# Patient Record
Sex: Female | Born: 1965 | Race: Black or African American | Hispanic: No | Marital: Married | Smoking: Never smoker
Health system: Southern US, Community
[De-identification: ages and names within clinical notes are randomized; demographics above are authoritative.]

## PROBLEM LIST (undated history)

## (undated) DIAGNOSIS — E785 Hyperlipidemia, unspecified: Secondary | ICD-10-CM

## (undated) DIAGNOSIS — K219 Gastro-esophageal reflux disease without esophagitis: Secondary | ICD-10-CM

## (undated) DIAGNOSIS — N870 Mild cervical dysplasia: Secondary | ICD-10-CM

## (undated) DIAGNOSIS — I10 Essential (primary) hypertension: Secondary | ICD-10-CM

## (undated) DIAGNOSIS — N750 Cyst of Bartholin's gland: Secondary | ICD-10-CM

## (undated) DIAGNOSIS — F419 Anxiety disorder, unspecified: Secondary | ICD-10-CM

## (undated) HISTORY — DX: Mild cervical dysplasia: N87.0

## (undated) HISTORY — PX: SALPINGECTOMY: SHX328

## (undated) HISTORY — DX: Essential (primary) hypertension: I10

## (undated) HISTORY — PX: TUBAL LIGATION: SHX77

## (undated) HISTORY — DX: Hyperlipidemia, unspecified: E78.5

## (undated) HISTORY — DX: Anxiety disorder, unspecified: F41.9

## (undated) HISTORY — PX: OTHER SURGICAL HISTORY: SHX169

---

## 1987-10-30 DIAGNOSIS — N870 Mild cervical dysplasia: Secondary | ICD-10-CM

## 1987-10-30 HISTORY — DX: Mild cervical dysplasia: N87.0

## 1998-12-05 ENCOUNTER — Emergency Department (HOSPITAL_COMMUNITY): Admission: EM | Admit: 1998-12-05 | Discharge: 1998-12-05 | Payer: Self-pay | Admitting: Emergency Medicine

## 1999-03-29 ENCOUNTER — Emergency Department (HOSPITAL_COMMUNITY): Admission: EM | Admit: 1999-03-29 | Discharge: 1999-03-29 | Payer: Self-pay | Admitting: Emergency Medicine

## 1999-03-29 ENCOUNTER — Encounter: Payer: Self-pay | Admitting: Emergency Medicine

## 1999-05-18 ENCOUNTER — Emergency Department (HOSPITAL_COMMUNITY): Admission: EM | Admit: 1999-05-18 | Discharge: 1999-05-18 | Payer: Self-pay | Admitting: Emergency Medicine

## 1999-12-10 ENCOUNTER — Emergency Department (HOSPITAL_COMMUNITY): Admission: EM | Admit: 1999-12-10 | Discharge: 1999-12-10 | Payer: Self-pay | Admitting: Emergency Medicine

## 1999-12-10 ENCOUNTER — Encounter: Payer: Self-pay | Admitting: Emergency Medicine

## 1999-12-13 ENCOUNTER — Emergency Department (HOSPITAL_COMMUNITY): Admission: EM | Admit: 1999-12-13 | Discharge: 1999-12-13 | Payer: Self-pay | Admitting: Emergency Medicine

## 2000-06-05 ENCOUNTER — Other Ambulatory Visit: Admission: RE | Admit: 2000-06-05 | Discharge: 2000-06-05 | Payer: Self-pay | Admitting: Family Medicine

## 2000-06-21 ENCOUNTER — Ambulatory Visit (HOSPITAL_COMMUNITY): Admission: RE | Admit: 2000-06-21 | Discharge: 2000-06-21 | Payer: Self-pay | Admitting: Critical Care Medicine

## 2000-06-21 ENCOUNTER — Encounter: Payer: Self-pay | Admitting: Critical Care Medicine

## 2000-08-05 ENCOUNTER — Ambulatory Visit (HOSPITAL_COMMUNITY): Admission: RE | Admit: 2000-08-05 | Discharge: 2000-08-05 | Payer: Self-pay | Admitting: Critical Care Medicine

## 2002-05-08 ENCOUNTER — Other Ambulatory Visit: Admission: RE | Admit: 2002-05-08 | Discharge: 2002-05-08 | Payer: Self-pay | Admitting: Family Medicine

## 2004-01-30 ENCOUNTER — Emergency Department (HOSPITAL_COMMUNITY): Admission: EM | Admit: 2004-01-30 | Discharge: 2004-01-30 | Payer: Self-pay | Admitting: Emergency Medicine

## 2006-06-03 ENCOUNTER — Inpatient Hospital Stay (HOSPITAL_COMMUNITY): Admission: AD | Admit: 2006-06-03 | Discharge: 2006-06-03 | Payer: Self-pay | Admitting: Gynecology

## 2006-10-08 ENCOUNTER — Emergency Department (HOSPITAL_COMMUNITY): Admission: EM | Admit: 2006-10-08 | Discharge: 2006-10-08 | Payer: Self-pay | Admitting: Emergency Medicine

## 2006-12-06 ENCOUNTER — Other Ambulatory Visit: Admission: RE | Admit: 2006-12-06 | Discharge: 2006-12-06 | Payer: Self-pay | Admitting: Internal Medicine

## 2007-01-03 ENCOUNTER — Other Ambulatory Visit: Admission: RE | Admit: 2007-01-03 | Discharge: 2007-01-03 | Payer: Self-pay | Admitting: Internal Medicine

## 2007-06-11 ENCOUNTER — Encounter: Admission: RE | Admit: 2007-06-11 | Discharge: 2007-06-11 | Payer: Self-pay | Admitting: Internal Medicine

## 2007-06-20 ENCOUNTER — Other Ambulatory Visit: Admission: RE | Admit: 2007-06-20 | Discharge: 2007-06-20 | Payer: Self-pay | Admitting: Gynecology

## 2007-07-07 HISTORY — PX: GYNECOLOGIC CRYOSURGERY: SHX857

## 2008-07-15 ENCOUNTER — Other Ambulatory Visit: Admission: RE | Admit: 2008-07-15 | Discharge: 2008-07-15 | Payer: Self-pay | Admitting: Gynecology

## 2008-07-15 ENCOUNTER — Ambulatory Visit: Payer: Self-pay | Admitting: Gynecology

## 2008-07-15 ENCOUNTER — Encounter: Payer: Self-pay | Admitting: Gynecology

## 2008-07-22 ENCOUNTER — Encounter: Admission: RE | Admit: 2008-07-22 | Discharge: 2008-07-22 | Payer: Self-pay | Admitting: Gynecology

## 2008-12-27 ENCOUNTER — Emergency Department (HOSPITAL_COMMUNITY): Admission: EM | Admit: 2008-12-27 | Discharge: 2008-12-27 | Payer: Self-pay | Admitting: Emergency Medicine

## 2009-07-28 ENCOUNTER — Emergency Department (HOSPITAL_COMMUNITY): Admission: EM | Admit: 2009-07-28 | Discharge: 2009-07-28 | Payer: Self-pay | Admitting: Family Medicine

## 2011-02-08 LAB — POCT I-STAT, CHEM 8
Glucose, Bld: 102 mg/dL — ABNORMAL HIGH (ref 70–99)
HCT: 42 % (ref 36.0–46.0)
Hemoglobin: 14.3 g/dL (ref 12.0–15.0)
Potassium: 3.6 mEq/L (ref 3.5–5.1)
Sodium: 138 mEq/L (ref 135–145)

## 2011-02-08 LAB — URINE MICROSCOPIC-ADD ON

## 2011-02-08 LAB — DIFFERENTIAL
Basophils Relative: 0 % (ref 0–1)
Eosinophils Absolute: 0 10*3/uL (ref 0.0–0.7)
Neutrophils Relative %: 83 % — ABNORMAL HIGH (ref 43–77)

## 2011-02-08 LAB — POCT CARDIAC MARKERS
CKMB, poc: <1 ng/mL — ABNORMAL LOW (ref 1.0–8.0)
CKMB, poc: <1 ng/mL — ABNORMAL LOW (ref 1.0–8.0)

## 2011-02-08 LAB — CBC
MCHC: 33.5 g/dL (ref 30.0–36.0)
MCV: 84.9 fL (ref 78.0–100.0)
Platelets: 196 10*3/uL (ref 150–400)

## 2011-02-08 LAB — URINALYSIS, ROUTINE W REFLEX MICROSCOPIC
Nitrite: NEGATIVE
Specific Gravity, Urine: 1.028 (ref 1.005–1.030)
pH: 6.5 (ref 5.0–8.0)

## 2011-02-08 LAB — POCT PREGNANCY, URINE: Preg Test, Ur: NEGATIVE

## 2011-03-16 NOTE — Procedures (Signed)
[place]  Patient:    Madeline Hawkins, Madeline Hawkins                        MRN: [id number] Proc. Date: 08/05/00 Adm. Date:  [id number] Disc. Date: [id number] Attending:  Caleb Popp                           Procedure Report  PROCEDURE PERFORMED:  Bronchoscopy.  ENDOSCOPIST:  Charlcie Cradle. Delford Field, M.D. [facility]  INDICATIONS:  Evaluate for upper airway obstruction.  ANESTHESIA:  1% Xylocaine local.  PREOP MEDICATIONS:  Demerol 40 mg IV push, Versed 5 mg IV push.  DESCRIPTION OF PROCEDURE:  The Olympus video bronchoscope was introduced  to the right naris.  The upper airways were visualized and were unremarkable. The entire tracheobronchial tree was visualized and revealed no endobronchial lesions and specifically the upper airways were widely patent.  The trachea was widely patent with no lesions seen.  The vocal cords did appear to adduct inappropriately during exploration, particularly the apex of the vocal cord triangle.  Otherwise, no other specific vocal cord lesions were identified.  COMPLICATIONS:  None.  SPECIMENS:  None.  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  RECOMMENDATIONS:  Continue proton pump inhibitor and consider gastroenterology referral. DD:  08/05/00 TD:  08/06/00 Job: 1800 [id number]

## 2011-11-08 ENCOUNTER — Ambulatory Visit (INDEPENDENT_AMBULATORY_CARE_PROVIDER_SITE_OTHER): Payer: Managed Care, Other (non HMO) | Admitting: Gynecology

## 2011-11-08 ENCOUNTER — Other Ambulatory Visit (HOSPITAL_COMMUNITY)
Admission: RE | Admit: 2011-11-08 | Discharge: 2011-11-08 | Disposition: A | Discharge status: Home / Self Care | Payer: Managed Care, Other (non HMO) | Source: Ambulatory Visit | Attending: Gynecology | Admitting: Gynecology

## 2011-11-08 ENCOUNTER — Emergency Department (HOSPITAL_COMMUNITY)
Admission: EM | Admit: 2011-11-08 | Discharge: 2011-11-09 | Disposition: A | Discharge status: Home / Self Care | Payer: Managed Care, Other (non HMO) | Attending: Emergency Medicine | Admitting: Emergency Medicine

## 2011-11-08 ENCOUNTER — Emergency Department (HOSPITAL_COMMUNITY): Payer: Managed Care, Other (non HMO)

## 2011-11-08 ENCOUNTER — Encounter: Payer: Self-pay | Admitting: Gynecology

## 2011-11-08 ENCOUNTER — Encounter (HOSPITAL_COMMUNITY): Payer: Self-pay | Admitting: Emergency Medicine

## 2011-11-08 VITALS — BP 128/82 | Ht 63.25 in | Wt 194.0 lb

## 2011-11-08 DIAGNOSIS — Z79899 Other long term (current) drug therapy: Secondary | ICD-10-CM | POA: Insufficient documentation

## 2011-11-08 DIAGNOSIS — D259 Leiomyoma of uterus, unspecified: Secondary | ICD-10-CM

## 2011-11-08 DIAGNOSIS — N83202 Unspecified ovarian cyst, left side: Secondary | ICD-10-CM

## 2011-11-08 DIAGNOSIS — R109 Unspecified abdominal pain: Secondary | ICD-10-CM | POA: Insufficient documentation

## 2011-11-08 DIAGNOSIS — N83209 Unspecified ovarian cyst, unspecified side: Secondary | ICD-10-CM

## 2011-11-08 DIAGNOSIS — N949 Unspecified condition associated with female genital organs and menstrual cycle: Secondary | ICD-10-CM

## 2011-11-08 DIAGNOSIS — Z01419 Encounter for gynecological examination (general) (routine) without abnormal findings: Secondary | ICD-10-CM | POA: Insufficient documentation

## 2011-11-08 DIAGNOSIS — I10 Essential (primary) hypertension: Secondary | ICD-10-CM | POA: Insufficient documentation

## 2011-11-08 DIAGNOSIS — R102 Pelvic and perineal pain: Secondary | ICD-10-CM

## 2011-11-08 LAB — DIFFERENTIAL
Eosinophils Absolute: 0 10*3/uL (ref 0.0–0.7)
Lymphocytes Relative: 35 % (ref 12–46)
Lymphs Abs: 2.4 10*3/uL (ref 0.7–4.0)
Monocytes Relative: 6 % (ref 3–12)
Neutro Abs: 4 10*3/uL (ref 1.7–7.7)
Neutrophils Relative %: 59 % (ref 43–77)

## 2011-11-08 LAB — COMPREHENSIVE METABOLIC PANEL
ALT: 14 U/L (ref 0–35)
Alkaline Phosphatase: 56 U/L (ref 39–117)
BUN: 15 mg/dL (ref 6–23)
CO2: 27 mEq/L (ref 19–32)
Chloride: 102 mEq/L (ref 96–112)
GFR calc Af Amer: >90 mL/min (ref >90)
GFR calc non Af Amer: >90 mL/min (ref >90)
Glucose, Bld: 116 mg/dL — ABNORMAL HIGH (ref 70–99)
Potassium: 3.9 mEq/L (ref 3.5–5.1)
Total Bilirubin: 0.2 mg/dL — ABNORMAL LOW (ref 0.3–1.2)

## 2011-11-08 LAB — CBC
Hemoglobin: 11.6 g/dL — ABNORMAL LOW (ref 12.0–15.0)
MCH: 27.2 pg (ref 26.0–34.0)
RBC: 4.27 MIL/uL (ref 3.87–5.11)
WBC: 6.9 10*3/uL (ref 4.0–10.5)

## 2011-11-08 LAB — LIPASE, BLOOD: Lipase: 24 U/L (ref 11–59)

## 2011-11-08 MED ORDER — HYDROMORPHONE HCL PF 1 MG/ML IJ SOLN
1.0000 mg | Freq: Once | INTRAMUSCULAR | Status: AC
  Administered 2011-11-08: 1 mg via INTRAVENOUS
  Filled 2011-11-08: qty 1

## 2011-11-08 MED ORDER — SODIUM CHLORIDE 0.9 % IV SOLN
999.0000 mL | INTRAVENOUS | Status: DC
  Administered 2011-11-08: 1000 mL via INTRAVENOUS

## 2011-11-08 MED ORDER — IOHEXOL 300 MG/ML  SOLN
100.0000 mL | Freq: Once | INTRAMUSCULAR | Status: AC | PRN
  Administered 2011-11-08: 100 mL via INTRAVENOUS

## 2011-11-08 MED ORDER — ONDANSETRON HCL 4 MG/2ML IJ SOLN
4.0000 mg | Freq: Once | INTRAMUSCULAR | Status: AC
  Administered 2011-11-08: 4 mg via INTRAVENOUS
  Filled 2011-11-08: qty 2

## 2011-11-08 NOTE — ED Notes (Signed)
Per Pt: abdominal pain started on Sunday and has not stopped (rated 7/10.) Denies n/v. Went to MD Lily Peer today who sent pt here "for CT and labs" and MD eval due to poor imaging of appendix in recent abd ultrasound that identified a uterine fibroid and ovarian cyst.

## 2011-11-08 NOTE — Patient Instructions (Signed)
Please go over to [facility] ER to see Dr. Fredricka Bonine who will see you. I have spoken with him and he will get the CT to rule out appendicitis and get additional lab work. If all is ok they will release you home and you can take the pain medication and our office will call you to schedule a date and time for your surgery. Please read the following on hysterectomy:  Hysterectomy A hysterectomy is a procedure where your womb (uterus) is surgically taken out. It will no longer be possible to have menstrual periods or to become pregnant. Removal of the tubes and ovaries (bilateral salpingo-oopherectomy) can be done during this operation as well.   An abdominal hysterectomy is done through a large cut (incision) in the abdomen made by the surgeon.   A vaginal hysterectomy is done through the vagina. There are no abdominal incisions, but there will be incisions on the inside of the vagina.   A laparoscopic assisted vaginal hysterectomy is done through 2 or 3 small incisions in the abdomen, but the uterus is removed and passed through the vagina.  Women who are going to have a hysterectomy should be tested first to make sure there is no cancer of the cervix or in the uterus. INDICATIONS FOR HYSTERECTOMY:  Persistent abnormal bleeding.   Lasting (chronic) pelvic pain.   Endometriosis. This is when the lining of the uterus (endometrium) is misplaced outside of its normal location.   Adenomyosis. This is when the endometrium tissue grows in the muscle of the uterus.   Uterine prolapse. This is when the uterus falls down into the vagina.   Cancer of the uterus or cervix that requires a radical hysterectomy, removal of the uterus, tubes, ovaries, and surrounding lymph nodes.  LET YOUR CAREGIVER KNOW ABOUT:  Allergies (especially to medicines).   Medications taken including herbs, eye drops, over the counter medications, and creams.   Use of steroids (by mouth or creams).   Past problems with  anesthetics or numbing medication.   Possibility of pregnancy, if this applies.   History of blood clots (thrombophlebitis).   History of bleeding or blood problems.   Past surgery.   Other health problems.  RISKS AND COMPLICATIONS All surgeries can have risks. Some of these risks are:  A lot of bleeding.   Injury to surrounding organs.   Infection.   Blood clots of the leg, heart, or lung.   Problems with anesthesia.   Early menopause.  BEFORE THE PROCEDURE  Do not take aspirin or blood thinners for a week before surgery, or as directed by your caregiver.   Do not eat or drink anything after midnight the night before surgery, or as directed by your caregiver.   Let your caregiver know if you get a cold or other infectious problems before surgery.   If you are being admitted the day of surgery, you should be present 60 minutes before your procedure or as told by your caregiver.  PROCEDURE   An IV (intravenous) will be placed in your arm. You will be given a drug to make you sleep (anesthetic) during surgery. You may be given a shot in the spine (spinal anesthesia) that will numb your body from the waist down. This will keep you pain-free during surgery.   When you wake from surgery, you will have the IV and a long, narrow, hollow tube (urinary catheter) draining the bladder for 1 or 2 days after surgery. This will make passing your urine  easier. It also helps by keeping your bladder empty during surgery.   After surgery, you will be taken to the recovery area where a nurse will watch and check your progress. Once you wake up, stable and taking fluids well, without other problems, you will be allowed to return to your room. Usually you will remain in the hospital 3 to 5 days. You may be given an antibiotic during and after the surgery and when you go home. Pain medication will be ordered by your caregiver while you are in the hospital and when you go home.  HOME CARE  INSTRUCTIONS  Healing will take time. You will have discomfort, tenderness, swelling, and bruising at the operative site for a couple of weeks. This is normal and will get better as time goes on.   Only take over-the-counter or prescription medicines for pain, discomfort, or fever as directed by your caregiver.   Do not take aspirin. It can cause bleeding.   Do not drive when taking pain medication.   Follow your caregiver's advice regarding diet, exercise, lifting, driving, and general activities.   Resume your usual diet as directed and allowed.   Get plenty of rest and sleep.   Do not douche, use tampons, or have sexual intercourse until your caregiver gives you permission.   Change your bandages (dressings) as directed.   Take your temperature twice a day. Write it down.   Your caregiver may recommend showers instead of baths for a few weeks.   Do not drink alcohol until your caregiver gives you permission.   If you develop constipation, you may take a mild laxative with your caregiver's permission. Bran foods and drinking fluids helps with constipation problems.   Try to have someone home with you for a week or two to help with the household activities.   Make sure you and your family understands everything about your operation and recovery.   Do not sign any legal documents until you feel normal again.   Keep all your follow-up appointments as recommended by your caregiver.  SEEK MEDICAL CARE IF:   There is swelling, redness, or increasing pain in the wound area.   Pus is coming from the wound.   You notice a bad smell from the wound or surgical dressing.   You have pain, redness, and swelling from the intravenous site.   The wound is breaking open (the edges are not staying together).   You feel dizzy or feel like fainting.   You develop pain or bleeding when you urinate.   You develop diarrhea.   You develop nausea and vomiting.   You develop abnormal  vaginal discharge.   You develop a rash.   You have any type of abnormal reaction or develop an allergy to your medication.   You need stronger pain medication for your pain.  SEEK IMMEDIATE MEDICAL CARE IF:  You have a fever.   You develop abdominal pain.   You develop chest pain.   You develop shortness of breath.   You pass out.   You develop pain, swelling, or redness of your leg.   You develop heavy vaginal bleeding with or without blood clots.  Document Released: 04/10/2001 Document Revised: 06/27/2011 Document Reviewed: 02/26/2008 [facility] Patient Information 2012 [place], [place].

## 2011-11-08 NOTE — Progress Notes (Signed)
Madeline Hawkins 06/29/1966 [id number]   History:    46 y.o.  for annual exam and had stated that she was receiving her medical exam and Pap smear had internal medical practice. She stated that on Sunday she began having periumbilical discomfort radiating to her right lower quadrant no nausea or vomiting no change in bowel movement habits. With her primary provider Delsa Sale  NP) for evaluation and blood work have been drawn to include a normal CBC normal comprehensive metabolic panel and had ordered an ultrasound with the following findings: Uterus measured 5.3 x 7.9 x 9.3 cm with an endometrial stripe of 0.7 cm. Right uterine fundus myoma was noted measuring 5.4 x 5.2 x 5.4 cm normal right ovary and the left ovary was normal size but contained a complex cyst was low level echoes which measured 4.5 x 3.8 x 5.3 cm with an appearance of a hemorrhagic cyst. The appendix was not identified on the ultrasound and recommendations for CT for further evaluation the patient persisted with the symptoms. Patient stated presented to the office here today for further evaluation. Patient states that she's been having normal menstrual cycle she does her monthly self breast examination. She has a history of left salpingectomy for an ectopic pregnancy and a right tubal ligation.      Past medical history,surgical history, family history and social history were all reviewed and documented in the EPIC chart.  Gynecologic History Patient's last menstrual period was 10/19/2011. Contraception: tubal ligation Last Pap: 2011. Results were: normal Last mammogram: 2009. Results were: normal  Obstetric History OB History    Grav Para Term Preterm Abortions TAB SAB Ect Mult Living   5 4 4  1   0 1  4     # Outc Date GA Lbr Len/2nd Wgt Sex Del Anes PTL Lv   1 TRM     F SVD  No Yes   2 TRM     F SVD  No Yes   3 TRM     M SVD  No Yes   4 TRM     F SVD  No Yes   5 ECT                ROS:  Was performed and  pertinent positives and negatives are included in the history.  Exam: chaperone present  BP 128/82  Ht 5' 3.25" (1.607 m)  Wt 194 lb (87.998 kg)  BMI 34.09 kg/m2  LMP 10/19/2011  Body mass index is 34.09 kg/(m^2).  General appearance : Well developed well nourished female. No acute distress HEENT: Neck supple, trachea midline, no carotid bruits, no thyroidmegaly Lungs: Clear to auscultation, no rhonchi or wheezes, or rib retractions  Heart: Regular rate and rhythm, no murmurs or gallops Breast:Examined in sitting and supine position were symmetrical in appearance, no palpable masses or tenderness,  no skin retraction, no nipple inversion, no nipple discharge, no skin discoloration, no axillary or supraclavicular lymphadenopathy Abdomen: no palpable masses or tenderness, no rebound or guarding Extremities: no edema or skin discoloration or tenderness  Pelvic:  Bartholin, Urethra, Skene Glands: Within normal limits             Vagina: No gross lesions or discharge  Cervix: No gross lesions or discharge  Uterus  anteverted, normal size, shape and consistency, non-tender and mobile  Adnexa  fullness left adnexa. Tender right lower quadrant  Anus and perineum  normal   Rectovaginal  normal sphincter tone without palpated masses or  tenderness             Hemoccult not done   Point tenderness in the right lower quadrant negative Rovsing negative operator and negatives heel tap sign.    Assessment/Plan:  46 y.o. female for annual exam who was being evaluated by a nurse practitioner at another facility early this week because of onset of right lower quadrant pain which had started perineum likable this past Sunday. Patient with normal CBC and comprehensive metabolic panel. Ultrasound demonstrated a right fundal fibroid and a left hemorrhagic cyst. Patient will be sent to [facility] long hospital I have spoken with Dr. Doylene Canard who will proceed with repeating her labs and scheduling a spiral CT to  rule out appendicitis. If CT is normal we'll plan in the next couple weeks for a total laparoscopic hysterectomy with ovarian cystectomy or possible abdominal hysterectomy with ovarian cystectomy. Literature information was provided to the patient will be in contact with her in the next few days if the CT scan is negative and she is released from the emergency room. All questions were answered we'll follow accordingly.   Ok Edwards MD, 5:29 PM 11/08/2011

## 2011-11-08 NOTE — ED Provider Notes (Signed)
History     CSN: [id number]  Arrival date & time 11/08/11  1737   First MD Initiated Contact with Patient 11/08/11 2141      Chief Complaint  Patient presents with  . Abdominal Pain    (Consider location/radiation/quality/duration/timing/severity/associated sxs/prior treatment) Patient is a 46 y.o. female presenting with abdominal pain. The history is provided by the patient.  Abdominal Pain The primary symptoms of the illness include abdominal pain. The current episode started more than 2 days ago. The onset of the illness was gradual. The problem has been gradually worsening.  The patient states that she believes she is currently not pregnant. Symptoms associated with the illness do not include anorexia, constipation or urgency.  The pain has been constant.  Eating does not make it worse.  Pt saw her doctor on Monday.  Pt was sent in today to have a CT scan of the abdomen.  Pt saw her OB doctor who diagnosed her with enlarged uterus /fibroids but they were unable to see the appendix on the ultrasound.  The pain is better lying on the left side.  Past Medical History  Diagnosis Date  . CIN I (cervical intraepithelial neoplasia I) 1989    cryo...also in 2008 .. had cryo   . Hypertension     Past Surgical History  Procedure Date  . Tubal ligation     right tubal ligation  . Salpingectomy     left for ectopic pregnancy  . Gynecologic cryosurgery 07/07/2007    Family History  Problem Relation Age of Onset  . Diabetes Brother   . Cancer Mother     OVARIAN  . Cancer Paternal Aunt     OVARIAN  . Heart disease Maternal Grandmother     History  Substance Use Topics  . Smoking status: Never Smoker   . Smokeless tobacco: Never Used  . Alcohol Use: No    OB History    Grav Para Term Preterm Abortions TAB SAB Ect Mult Living   5 4 4  1   0 1  4      Review of Systems  Gastrointestinal: Positive for abdominal pain. Negative for constipation and anorexia.    Genitourinary: Negative for urgency.  All other systems reviewed and are negative.    Allergies  Review of patient's allergies indicates no known allergies.  Home Medications   Current Outpatient Rx  Name Route Sig Dispense Refill  . HYDROCODONE-ACETAMINOPHEN 5-500 MG PO TABS Oral Take 1 tablet by mouth every 6 (six) hours as needed. pain    . LISINOPRIL-HYDROCHLOROTHIAZIDE 10-12.5 MG PO TABS Oral Take 1 tablet by mouth daily.      BP 122/76  Pulse 79  Temp(Src) 98.3 F (36.8 C) (Oral)  Resp 18  Ht 5\' 3"  (1.6 m)  Wt 192 lb (87.091 kg)  BMI 34.01 kg/m2  SpO2 100%  LMP 10/19/2011  Physical Exam  Nursing note and vitals reviewed. Constitutional: She appears well-developed and well-nourished. No distress.  HENT:  Head: Normocephalic and atraumatic.  Right Ear: External ear normal.  Left Ear: External ear normal.  Eyes: Conjunctivae are normal. Right eye exhibits no discharge. Left eye exhibits no discharge. No scleral icterus.  Neck: Neck supple. No tracheal deviation present.  Cardiovascular: Normal rate, regular rhythm and intact distal pulses.   Pulmonary/Chest: Effort normal and breath sounds normal. No stridor. No respiratory distress. She has no wheezes. She has no rales.  Abdominal: Soft. Bowel sounds are normal. She exhibits no distension. There is  tenderness in the right lower quadrant and suprapubic area. There is no rigidity, no rebound, no guarding, no tenderness at McBurney's point and negative Murphy's sign.  Musculoskeletal: She exhibits no edema and no tenderness.  Neurological: She is alert. She has normal strength. No sensory deficit. Cranial nerve deficit:  no gross defecits noted. She exhibits normal muscle tone. She displays no seizure activity. Coordination normal.  Skin: Skin is warm and dry. No rash noted.  Psychiatric: She has a normal mood and affect.    ED Course  Procedures (including critical care time)  Labs Reviewed  CBC - Abnormal;  Notable for the following:    Hemoglobin 11.6 (*)    HCT 35.3 (*)    All other components within normal limits  COMPREHENSIVE METABOLIC PANEL - Abnormal; Notable for the following:    Glucose, Bld 116 (*)    Total Bilirubin 0.2 (*)    All other components within normal limits  DIFFERENTIAL  LIPASE, BLOOD  URINALYSIS, ROUTINE W REFLEX MICROSCOPIC   Ct Abdomen Pelvis W Contrast  11/09/2011  *RADIOLOGY REPORT*  Clinical Data: Abdominal pain, concern for appendicitis.  CT ABDOMEN AND PELVIS WITH CONTRAST  Technique:  Multidetector CT imaging of the abdomen and pelvis was performed following the standard protocol during bolus administration of intravenous contrast.  Contrast: OMNIPAQUE IOHEXOL 300 MG/ML IV SOLN  Comparison: CT abdomen 06/11/2007  Findings: Lung bases are clear.  No pericardial fluid.  No focal hepatic lesion.  Gallbladder, pancreas, spleen, adrenal glands, kidneys are normal.  The stomach, small bowel, appendix, and colon are normal.  No retroperitoneal periportal lymphadenopathy.  Small amount free fluid in the posterior cul-de-sac.  There is interval enlargement of a rounded mass within the right side of the uterine body measuring 4.4 x 5.4 cm increased from 2.2 x 2.2 cm on prior.  There is a 3.8 cm cystic lesion within the left ovary which does not have simple fluid attenuation but is relatively low.  The right ovary is normal.  No pelvic lymphadenopathy.  IMPRESSION:  1.  Normal appendix. 2.  Interval enlargement of a rounded mass within the right aspect the uterus most likely represents an enlarging benign leiomyoma. 3. Probable hemorrhagic cyst within the left ovary.  Recommend non emergent pelvic ultrasound in 6 weeks for further characterization.  Original Report Authenticated By: Genevive Bi, M.D.     1. Ovarian cyst   2. Fibroid uterus       MDM  Patient does not have appendicitis. She does have a large uterine fibroid. She also has an ovarian cyst. Patient has  plans to followup with Dr. Lily Peer for possible hysterectomy.  Pt id comfortable at this time. She has pain meds available at home.        Celene Kras, MD 11/09/11 [phone]

## 2011-11-09 NOTE — ED Notes (Signed)
Returned from CT.

## 2011-11-09 NOTE — ED Notes (Signed)
Patient still unable to void.

## 2011-11-12 ENCOUNTER — Telehealth: Payer: Self-pay

## 2011-11-12 NOTE — Telephone Encounter (Signed)
Spoke with patient and scheduled her TLH, Ovarian Cystectomy, poss LSO for next week 11/22/11 at 1:00pm at [facility].  She will come tomorrow morning to have preop consult with Dr.JF.   She knows to expect call from [facility] with all her instructions.

## 2011-11-13 ENCOUNTER — Encounter: Payer: Self-pay | Admitting: Gynecology

## 2011-11-13 ENCOUNTER — Encounter (HOSPITAL_COMMUNITY)
Admission: RE | Admit: 2011-11-13 | Discharge: 2011-11-13 | Disposition: A | Discharge status: Home / Self Care | Payer: Managed Care, Other (non HMO) | Source: Ambulatory Visit | Attending: Gynecology | Admitting: Gynecology

## 2011-11-13 ENCOUNTER — Ambulatory Visit (INDEPENDENT_AMBULATORY_CARE_PROVIDER_SITE_OTHER): Payer: Managed Care, Other (non HMO) | Admitting: Gynecology

## 2011-11-13 ENCOUNTER — Other Ambulatory Visit: Payer: Self-pay

## 2011-11-13 DIAGNOSIS — N920 Excessive and frequent menstruation with regular cycle: Secondary | ICD-10-CM | POA: Insufficient documentation

## 2011-11-13 DIAGNOSIS — D259 Leiomyoma of uterus, unspecified: Secondary | ICD-10-CM | POA: Insufficient documentation

## 2011-11-13 DIAGNOSIS — N946 Dysmenorrhea, unspecified: Secondary | ICD-10-CM | POA: Insufficient documentation

## 2011-11-13 DIAGNOSIS — N83202 Unspecified ovarian cyst, left side: Secondary | ICD-10-CM | POA: Insufficient documentation

## 2011-11-13 DIAGNOSIS — D649 Anemia, unspecified: Secondary | ICD-10-CM | POA: Insufficient documentation

## 2011-11-13 DIAGNOSIS — N83209 Unspecified ovarian cyst, unspecified side: Secondary | ICD-10-CM

## 2011-11-13 DIAGNOSIS — Z01818 Encounter for other preprocedural examination: Secondary | ICD-10-CM

## 2011-11-13 LAB — SURGICAL PCR SCREEN: MRSA, PCR: NEGATIVE

## 2011-11-13 LAB — COMPREHENSIVE METABOLIC PANEL
ALT: 15 U/L (ref 0–35)
AST: 17 U/L (ref 0–37)
Calcium: 10 mg/dL (ref 8.4–10.5)
Creatinine, Ser: 0.81 mg/dL (ref 0.50–1.10)
GFR calc Af Amer: >90 mL/min (ref >90)
Sodium: 135 mEq/L (ref 135–145)
Total Protein: 7.3 g/dL (ref 6.0–8.3)

## 2011-11-13 LAB — URINE MICROSCOPIC-ADD ON

## 2011-11-13 LAB — URINALYSIS, ROUTINE W REFLEX MICROSCOPIC
Bilirubin Urine: NEGATIVE
Nitrite: NEGATIVE
Specific Gravity, Urine: 1.025 (ref 1.005–1.030)
pH: 6 (ref 5.0–8.0)

## 2011-11-13 LAB — CBC
MCH: 27.6 pg (ref 26.0–34.0)
MCHC: 32.6 g/dL (ref 30.0–36.0)
Platelets: 239 10*3/uL (ref 150–400)

## 2011-11-13 NOTE — Patient Instructions (Signed)
Appointment today with anesthesia at [facility].

## 2011-11-13 NOTE — Patient Instructions (Addendum)
YOUR PROCEDURE IS SCHEDULED ON:11/22/11  ENTER THROUGH THE MAIN ENTRANCE OF [facility] AT: 1130am  USE DESK PHONE AND DIAL [phone] TO INFORM us OF YOUR ARRIVAL  CALL [phone] IF YOU HAVE ANY QUESTIONS OR PROBLEMS PRIOR TO YOUR ARRIVAL.  REMEMBER: DO NOT EAT AFTER MIDNIGHT :Wed  SPECIAL INSTRUCTIONS:clear liquids until 9am on Thursday  YOU MAY BRUSH YOUR TEETH THE MORNING OF SURGERY   TAKE THESE MEDICINES THE DAY OF SURGERY WITH SIP OF WATER:BP med   DO NOT WEAR JEWELRY, EYE MAKEUP, LIPSTICK OR DARK FINGERNAIL POLISH DO NOT WEAR LOTIONS  DO NOT SHAVE FOR 48 HOURS PRIOR TO SURGERY  YOU WILL NOT BE ALLOWED TO DRIVE YOURSELF HOME.  NAME OF DRIVER: Brynda Greathouse [id number]

## 2011-11-13 NOTE — Progress Notes (Signed)
Madeline Hawkins is an 46 y.o. female who was seen in the office on January 10 with the following history:  46 y.o. for annual exam and had stated that she was receiving her medical exam and Pap smear had internal medical practice. She stated that on Sunday she began having periumbilical discomfort radiating to her right lower quadrant no nausea or vomiting no change in bowel movement habits. With her primary provider Madeline Sale NP) for evaluation and blood work have been drawn to include a normal CBC normal comprehensive metabolic panel and had ordered an ultrasound with the following findings:  Uterus measured 5.3 x 7.9 x 9.3 cm with an endometrial stripe of 0.7 cm. Right uterine fundus myoma was noted measuring 5.4 x 5.2 x 5.4 cm normal right ovary and the left ovary was normal size but contained a complex cyst was low level echoes which measured 4.5 x 3.8 x 5.3 cm with an appearance of a hemorrhagic cyst. The appendix was not identified on the ultrasound and recommendations for CT for further evaluation the patient persisted with the symptoms. Patient stated presented to the office here today for further evaluation.  Patient states that she's been having normal menstrual cycle she does her monthly self breast examination. She has a history of left salpingectomy for an ectopic pregnancy and a right tubal ligation.  Her CT scan later in the day demonstrated the following:  Small amount free fluid in the posterior cul-de-sac. There is  interval enlargement of a rounded mass within the right side of the  uterine body measuring 4.4 x 5.4 cm increased from 2.2 x 2.2 cm on  prior. There is a 3.8 cm cystic lesion within the left ovary which  does not have simple fluid attenuation but is relatively low. The  right ovary is normal. No pelvic lymphadenopathy.  IMPRESSION:  1. Normal appendix.  2. Interval enlargement of a rounded mass within the right aspect  the uterus most likely represents an  enlarging benign leiomyoma.  3. Probable hemorrhagic cyst within the left ovary.    Pertinent Gynecological History: Menses: flow is moderate Bleeding: Regular  Contraception: tubal ligation DES exposure: denies Blood transfusions: none Sexually transmitted diseases: no past history Previous GYN Procedures: cryo  Last mammogram: normal Date: Does not recall Last pap: normal Date: 2013 OB History: G5, P4,A1 (left ectopic/left salpingectomy/right tubal ligation)   Menstrual History: Menarche age: H. 70  Patient's last menstrual period was 11/10/2011.    Past Medical History  Diagnosis Date  . CIN I (cervical intraepithelial neoplasia I) 1989    cryo...also in 2008 .. had cryo   . Hypertension     Past Surgical History  Procedure Date  . Salpingectomy     left for ectopic pregnancy  . Gynecologic cryosurgery 07/07/2007  . Tubal ligation     right tubal ligation    Family History  Problem Relation Age of Onset  . Diabetes Brother   . Cancer Mother     OVARIAN  . Cancer Paternal Aunt     OVARIAN  . Heart disease Maternal Grandmother     Social History:  reports that she has never smoked. She has never used smokeless tobacco. She reports that she does not drink alcohol or use illicit drugs.  Allergies: No Known Allergies   (Not in a hospital admission)  @ROS @  Blood pressure 110/70, last menstrual period 11/10/2011.  Physical Exam:  HEENT:unremarkable Neck:Supple, midline, no thyroid megaly, no carotid bruits Lungs:  Clear  to auscultation no rhonchi's or wheezes Heart:Regular rate and rhythm, no murmurs or gallops Breast Exam:Examined recently normal  Abdomen:Soft tenderness in the right lower quadrant  Pelvic:BUSwithin normal limits  Vagina:No vaginal lesions or discharge  Cervix:No lesions or discharge  Uterus:Uterus irregular shaped of the right fundal region/fibroid  Adnexa:Slight fullness left adnexa  Extremities: No cords, no edema Rectal:Not  done  No results found for this or any previous visit (from the past 24 hour(s)).  No results found.  Assessment/Plan: Patient was seen today for preoperative consultation as a result of her symptomatic uterine fibroid dysmenorrhea and menorrhagia. Her hemoglobin on January 10 was 11.6. Ultrasound and CT demonstrating a left ovarian cyst measuring 4.5 x 3.8 x 5.3 cm and a fundal fibroid measuring 5.4 x 5.2 x 5.4 cm. Patient was counseled for total laparoscopic hysterectomy with left ovarian cystectomy possible left salpingo-oophorectomy. Literature information previously provided outlining the procedure in detail. The risks benefits and pros and cons of the operation discussed are as follows: Risk of deep venous thrombosis and pulmonary embolism (patient will have PAS stockings for prophylaxis), the risk of infection (she'll receive intravenous antibiotics), the risk of hemorrhage (if she were to need a blood transfusion she is fully aware potential risk of anaphylactic reaction hepatitis and AIDS), risk of trauma and injury to internal organs requiring open laparotomy to repair any internal injury. Also in the event that the operation is not able to be completed laparoscopically an open abdominal approach may need to be done which will require the patient to be in the hospital additional days. Patient is fully aware of all the above all questions are answered and we'll follow accordingly.  Madeline Hawkins H 11/13/2011, 11:35 AM

## 2011-11-22 ENCOUNTER — Encounter (HOSPITAL_COMMUNITY)
Admission: RE | Disposition: A | Discharge status: Home / Self Care | Payer: Self-pay | Source: Ambulatory Visit | Attending: Gynecology

## 2011-11-22 ENCOUNTER — Other Ambulatory Visit: Payer: Self-pay | Admitting: Gynecology

## 2011-11-22 ENCOUNTER — Encounter (HOSPITAL_COMMUNITY): Payer: Self-pay | Admitting: Anesthesiology

## 2011-11-22 ENCOUNTER — Encounter (HOSPITAL_COMMUNITY): Payer: Self-pay | Admitting: *Deleted

## 2011-11-22 ENCOUNTER — Ambulatory Visit (HOSPITAL_COMMUNITY): Payer: Managed Care, Other (non HMO) | Admitting: Anesthesiology

## 2011-11-22 ENCOUNTER — Inpatient Hospital Stay (HOSPITAL_COMMUNITY)
Admission: RE | Admit: 2011-11-22 | Discharge: 2011-11-24 | DRG: 743 | Disposition: A | Discharge status: Home / Self Care | Payer: Managed Care, Other (non HMO) | Source: Ambulatory Visit | Attending: Gynecology | Admitting: Gynecology

## 2011-11-22 DIAGNOSIS — N920 Excessive and frequent menstruation with regular cycle: Secondary | ICD-10-CM

## 2011-11-22 DIAGNOSIS — Z01812 Encounter for preprocedural laboratory examination: Secondary | ICD-10-CM

## 2011-11-22 DIAGNOSIS — N946 Dysmenorrhea, unspecified: Secondary | ICD-10-CM

## 2011-11-22 DIAGNOSIS — N83209 Unspecified ovarian cyst, unspecified side: Secondary | ICD-10-CM

## 2011-11-22 DIAGNOSIS — Z01818 Encounter for other preprocedural examination: Secondary | ICD-10-CM

## 2011-11-22 DIAGNOSIS — D259 Leiomyoma of uterus, unspecified: Secondary | ICD-10-CM

## 2011-11-22 DIAGNOSIS — N949 Unspecified condition associated with female genital organs and menstrual cycle: Secondary | ICD-10-CM | POA: Diagnosis present

## 2011-11-22 DIAGNOSIS — N83202 Unspecified ovarian cyst, left side: Secondary | ICD-10-CM

## 2011-11-22 DIAGNOSIS — D251 Intramural leiomyoma of uterus: Principal | ICD-10-CM | POA: Diagnosis present

## 2011-11-22 DIAGNOSIS — I10 Essential (primary) hypertension: Secondary | ICD-10-CM | POA: Diagnosis present

## 2011-11-22 DIAGNOSIS — Z5331 Laparoscopic surgical procedure converted to open procedure: Secondary | ICD-10-CM

## 2011-11-22 DIAGNOSIS — D649 Anemia, unspecified: Secondary | ICD-10-CM

## 2011-11-22 HISTORY — PX: LAPAROSCOPY: SHX197

## 2011-11-22 HISTORY — PX: ABDOMINAL HYSTERECTOMY: SHX81

## 2011-11-22 HISTORY — PX: OVARIAN CYST REMOVAL: SHX89

## 2011-11-22 SURGERY — LAPAROSCOPY, DIAGNOSTIC
Anesthesia: General | Site: Abdomen | Wound class: Clean

## 2011-11-22 MED ORDER — ONDANSETRON HCL 4 MG/2ML IJ SOLN
INTRAMUSCULAR | Status: DC | PRN
  Administered 2011-11-22 (×2): 2 mg via INTRAVENOUS

## 2011-11-22 MED ORDER — FENTANYL CITRATE 0.05 MG/ML IJ SOLN
25.0000 ug | INTRAMUSCULAR | Status: DC | PRN
  Administered 2011-11-22 (×3): 50 ug via INTRAVENOUS

## 2011-11-22 MED ORDER — CEFAZOLIN SODIUM 1-5 GM-% IV SOLN
INTRAVENOUS | Status: AC
  Filled 2011-11-22: qty 50

## 2011-11-22 MED ORDER — NEOSTIGMINE METHYLSULFATE 1 MG/ML IJ SOLN
INTRAMUSCULAR | Status: DC | PRN
  Administered 2011-11-22: 4 mg via INTRAVENOUS

## 2011-11-22 MED ORDER — PHENYLEPHRINE 40 MCG/ML (10ML) SYRINGE FOR IV PUSH (FOR BLOOD PRESSURE SUPPORT)
PREFILLED_SYRINGE | INTRAVENOUS | Status: AC
  Filled 2011-11-22: qty 5

## 2011-11-22 MED ORDER — LIDOCAINE HCL (CARDIAC) 20 MG/ML IV SOLN
INTRAVENOUS | Status: AC
  Filled 2011-11-22: qty 5

## 2011-11-22 MED ORDER — PROPOFOL 10 MG/ML IV EMUL
INTRAVENOUS | Status: AC
  Filled 2011-11-22: qty 20

## 2011-11-22 MED ORDER — PHENYLEPHRINE HCL 10 MG/ML IJ SOLN
INTRAMUSCULAR | Status: DC | PRN
  Administered 2011-11-22: 80 mg via INTRAVENOUS
  Administered 2011-11-22: 40 mg via INTRAVENOUS
  Administered 2011-11-22: 80 mg via INTRAVENOUS

## 2011-11-22 MED ORDER — MIDAZOLAM HCL 2 MG/2ML IJ SOLN
INTRAMUSCULAR | Status: AC
Start: 2011-11-22 — End: 2011-11-22
  Filled 2011-11-22: qty 2

## 2011-11-22 MED ORDER — FENTANYL CITRATE 0.05 MG/ML IJ SOLN
INTRAMUSCULAR | Status: AC
  Administered 2011-11-22: 50 ug via INTRAVENOUS
  Filled 2011-11-22: qty 2

## 2011-11-22 MED ORDER — PROPOFOL 10 MG/ML IV EMUL
INTRAVENOUS | Status: DC | PRN
  Administered 2011-11-22: 200 mg via INTRAVENOUS

## 2011-11-22 MED ORDER — NALOXONE HCL 0.4 MG/ML IJ SOLN: 0.4000 mg | INTRAMUSCULAR | Status: DC | PRN

## 2011-11-22 MED ORDER — LACTATED RINGERS IV SOLN
INTRAVENOUS | Status: DC
  Administered 2011-11-22: 11:00:00 via INTRAVENOUS
  Administered 2011-11-22: 125 mL/h via INTRAVENOUS

## 2011-11-22 MED ORDER — GLYCOPYRROLATE 0.2 MG/ML IJ SOLN
INTRAMUSCULAR | Status: AC
  Filled 2011-11-22: qty 2

## 2011-11-22 MED ORDER — DIPHENHYDRAMINE HCL 12.5 MG/5ML PO ELIX: 12.5000 mg | ORAL_SOLUTION | Freq: Four times a day (QID) | ORAL | Status: DC | PRN

## 2011-11-22 MED ORDER — ONDANSETRON HCL 4 MG/2ML IJ SOLN: 4.0000 mg | Freq: Four times a day (QID) | INTRAMUSCULAR | Status: DC | PRN

## 2011-11-22 MED ORDER — BUPIVACAINE HCL (PF) 0.25 % IJ SOLN
INTRAMUSCULAR | Status: DC | PRN
  Administered 2011-11-22: 10 mL

## 2011-11-22 MED ORDER — SODIUM CHLORIDE 0.9 % IJ SOLN: 9.0000 mL | INTRAMUSCULAR | Status: DC | PRN

## 2011-11-22 MED ORDER — ROCURONIUM BROMIDE 50 MG/5ML IV SOLN
INTRAVENOUS | Status: AC
  Filled 2011-11-22: qty 1

## 2011-11-22 MED ORDER — FENTANYL CITRATE 0.05 MG/ML IJ SOLN
INTRAMUSCULAR | Status: AC
  Filled 2011-11-22: qty 2

## 2011-11-22 MED ORDER — KETOROLAC TROMETHAMINE 30 MG/ML IJ SOLN
INTRAMUSCULAR | Status: AC
  Filled 2011-11-22: qty 1

## 2011-11-22 MED ORDER — MIDAZOLAM HCL 5 MG/5ML IJ SOLN
INTRAMUSCULAR | Status: DC | PRN
  Administered 2011-11-22: 1.5 mg via INTRAVENOUS
  Administered 2011-11-22: .5 mg via INTRAVENOUS

## 2011-11-22 MED ORDER — ONDANSETRON HCL 4 MG/2ML IJ SOLN
INTRAMUSCULAR | Status: AC
Start: 2011-11-22 — End: 2011-11-22
  Filled 2011-11-22: qty 2

## 2011-11-22 MED ORDER — ROCURONIUM BROMIDE 100 MG/10ML IV SOLN
INTRAVENOUS | Status: DC | PRN
  Administered 2011-11-22: 10 mg via INTRAVENOUS
  Administered 2011-11-22: 50 mg via INTRAVENOUS

## 2011-11-22 MED ORDER — HYDROMORPHONE HCL PF 1 MG/ML IJ SOLN
INTRAMUSCULAR | Status: DC | PRN
  Administered 2011-11-22: 1 mg via INTRAVENOUS

## 2011-11-22 MED ORDER — LACTATED RINGERS IV SOLN
INTRAVENOUS | Status: DC
  Administered 2011-11-22 – 2011-11-23 (×2): via INTRAVENOUS

## 2011-11-22 MED ORDER — NEOSTIGMINE METHYLSULFATE 1 MG/ML IJ SOLN
INTRAMUSCULAR | Status: AC
  Filled 2011-11-22: qty 10

## 2011-11-22 MED ORDER — FENTANYL CITRATE 0.05 MG/ML IJ SOLN
INTRAMUSCULAR | Status: AC
  Filled 2011-11-22: qty 5

## 2011-11-22 MED ORDER — DIPHENHYDRAMINE HCL 50 MG/ML IJ SOLN: 12.5000 mg | Freq: Four times a day (QID) | INTRAMUSCULAR | Status: DC | PRN

## 2011-11-22 MED ORDER — KETOROLAC TROMETHAMINE 30 MG/ML IJ SOLN
INTRAMUSCULAR | Status: DC | PRN
  Administered 2011-11-22: 30 mg via INTRAVENOUS

## 2011-11-22 MED ORDER — GLYCOPYRROLATE 0.2 MG/ML IJ SOLN
INTRAMUSCULAR | Status: DC | PRN
  Administered 2011-11-22: .8 mg via INTRAVENOUS

## 2011-11-22 MED ORDER — KETOROLAC TROMETHAMINE 30 MG/ML IJ SOLN: 15.0000 mg | Freq: Once | INTRAMUSCULAR | Status: DC | PRN

## 2011-11-22 MED ORDER — DEXAMETHASONE SODIUM PHOSPHATE 10 MG/ML IJ SOLN
INTRAMUSCULAR | Status: AC
  Filled 2011-11-22: qty 1

## 2011-11-22 MED ORDER — FENTANYL CITRATE 0.05 MG/ML IJ SOLN
INTRAMUSCULAR | Status: DC | PRN
  Administered 2011-11-22 (×2): 50 ug via INTRAVENOUS
  Administered 2011-11-22 (×2): 100 ug via INTRAVENOUS
  Administered 2011-11-22: 50 ug via INTRAVENOUS

## 2011-11-22 MED ORDER — CEFAZOLIN SODIUM 1-5 GM-% IV SOLN
1.0000 g | INTRAVENOUS | Status: AC
  Administered 2011-11-22: 1 g via INTRAVENOUS

## 2011-11-22 MED ORDER — LIDOCAINE HCL (CARDIAC) 20 MG/ML IV SOLN
INTRAVENOUS | Status: DC | PRN
  Administered 2011-11-22: 100 mg via INTRAVENOUS

## 2011-11-22 MED ORDER — MORPHINE SULFATE (PF) 1 MG/ML IV SOLN
INTRAVENOUS | Status: DC
  Administered 2011-11-22: 19:00:00 via INTRAVENOUS
  Administered 2011-11-22: 10.5 mg via INTRAVENOUS
  Administered 2011-11-23: 4.5 mg via INTRAVENOUS
  Administered 2011-11-23: 1.45 mg via INTRAVENOUS
  Administered 2011-11-23: 9 mg via INTRAVENOUS
  Administered 2011-11-23: 4.5 mg via INTRAVENOUS
  Filled 2011-11-22 (×2): qty 25

## 2011-11-22 MED ORDER — HYDROMORPHONE HCL PF 1 MG/ML IJ SOLN
INTRAMUSCULAR | Status: AC
  Filled 2011-11-22: qty 1

## 2011-11-22 MED ORDER — DEXAMETHASONE SODIUM PHOSPHATE 4 MG/ML IJ SOLN
INTRAMUSCULAR | Status: DC | PRN
  Administered 2011-11-22 (×2): 5 mg via INTRAVENOUS

## 2011-11-22 MED ORDER — BUPIVACAINE HCL (PF) 0.25 % IJ SOLN
INTRAMUSCULAR | Status: AC
Start: 2011-11-22 — End: 2011-11-22
  Filled 2011-11-22: qty 30

## 2011-11-22 SURGICAL SUPPLY — 50 items
BARRIER ADHS 3X4 INTERCEED (GAUZE/BANDAGES/DRESSINGS) ×3 IMPLANT
BLADE SURG 10 STRL SS (BLADE) ×6 IMPLANT
BRR ADH 4X3 ABS CNTRL BYND (GAUZE/BANDAGES/DRESSINGS) ×4
CLOTH BEACON ORANGE TIMEOUT ST (SAFETY) ×5 IMPLANT
CONT PATH 16OZ SNAP LID 3702 (MISCELLANEOUS) ×3 IMPLANT
COUNTER NEEDLE 1200 MAGNETIC (NEEDLE) ×3 IMPLANT
COVER MAYO STAND STRL (DRAPES) ×5 IMPLANT
COVER TABLE BACK 60X90 (DRAPES) ×5 IMPLANT
DRESSING TELFA 8X3 (GAUZE/BANDAGES/DRESSINGS) ×3 IMPLANT
DRSG XEROFORM 1X8 (GAUZE/BANDAGES/DRESSINGS) ×3 IMPLANT
ELECT NDL TIP 2.8 STRL (NEEDLE) IMPLANT
ELECT NEEDLE TIP 2.8 STRL (NEEDLE) ×5 IMPLANT
EVACUATOR SMOKE 8.L (FILTER) ×5 IMPLANT
GAUZE SPONGE 4X4 12PLY STRL LF (GAUZE/BANDAGES/DRESSINGS) ×3 IMPLANT
GLOVE BIO SURGEON STRL SZ7.5 (GLOVE) ×5 IMPLANT
GLOVE BIOGEL PI IND STRL 6.5 (GLOVE) ×2 IMPLANT
GLOVE BIOGEL PI IND STRL 8 (GLOVE) ×4 IMPLANT
GLOVE BIOGEL PI INDICATOR 6.5 (GLOVE) ×1
GLOVE BIOGEL PI INDICATOR 8 (GLOVE) ×1
GLOVE ECLIPSE 7.5 STRL STRAW (GLOVE) ×13 IMPLANT
NS IRRIG 1000ML POUR BTL (IV SOLUTION) ×5 IMPLANT
PACK LAVH (CUSTOM PROCEDURE TRAY) ×5 IMPLANT
PAD ABD 7.5X8 STRL (GAUZE/BANDAGES/DRESSINGS) ×3 IMPLANT
SCALPEL HARMONIC ACE (MISCELLANEOUS) ×5 IMPLANT
SCISSORS LAP 5X35 DISP (ENDOMECHANICALS) ×3 IMPLANT
SET IRRIG TUBING LAPAROSCOPIC (IRRIGATION / IRRIGATOR) ×5 IMPLANT
SUT VIC AB 0 CT1 18XCR BRD8 (SUTURE) ×6 IMPLANT
SUT VIC AB 0 CT1 27 (SUTURE) ×10
SUT VIC AB 0 CT1 27XBRD ANBCTR (SUTURE) ×4 IMPLANT
SUT VIC AB 0 CT1 36 (SUTURE) ×9 IMPLANT
SUT VIC AB 0 CT1 8-18 (SUTURE) ×15
SUT VIC AB 2-0 CT1 27 (SUTURE) ×5
SUT VIC AB 2-0 CT1 TAPERPNT 27 (SUTURE) ×2 IMPLANT
SUT VIC AB 2-0 SH 27 (SUTURE)
SUT VIC AB 2-0 SH 27XBRD (SUTURE) IMPLANT
SUT VIC AB 3-0 SH 27 (SUTURE) ×15
SUT VIC AB 3-0 SH 27X BRD (SUTURE) ×4 IMPLANT
SUT VIC AB 3-0 SH 27XBRD (SUTURE) ×2 IMPLANT
SUT VICRYL 0 TIES 12 18 (SUTURE) ×3 IMPLANT
SUT VICRYL 0 UR6 27IN ABS (SUTURE) ×8 IMPLANT
SUT VICRYL RAPIDE 3 0 (SUTURE) ×7 IMPLANT
SYR 50ML LL SCALE MARK (SYRINGE) ×5 IMPLANT
SYR BULB IRRIGATION 50ML (SYRINGE) ×5 IMPLANT
TIP UTERINE 6.7X8CM BLUE DISP (MISCELLANEOUS) ×3 IMPLANT
TOWEL OR 17X24 6PK STRL BLUE (TOWEL DISPOSABLE) ×13 IMPLANT
TRAY FOLEY CATH 14FR (SET/KITS/TRAYS/PACK) ×5 IMPLANT
TROCAR XCEL NON-BLD 11X100MML (ENDOMECHANICALS) ×10 IMPLANT
TROCAR XCEL NON-BLD 5MMX100MML (ENDOMECHANICALS) ×10 IMPLANT
WARMER LAPAROSCOPE (MISCELLANEOUS) ×5 IMPLANT
WATER STERILE IRR 1000ML POUR (IV SOLUTION) ×5 IMPLANT

## 2011-11-22 NOTE — Transfer of Care (Signed)
Immediate Anesthesia Transfer of Care Note  Patient: Madeline Hawkins  Procedure(s) Performed:  LAPAROSCOPY DIAGNOSTIC; HYSTERECTOMY ABDOMINAL; OVARIAN CYSTECTOMY  Patient Location: PACU  Anesthesia Type: General  Level of Consciousness: sedated and patient cooperative  Airway & Oxygen Therapy: Patient Spontanous Breathing and Patient connected to nasal cannula oxygen  Post-op Assessment: Report given to PACU RN and Post -op Vital signs reviewed and stable  Post vital signs: stable  Complications: No apparent anesthesia complications

## 2011-11-22 NOTE — Anesthesia Procedure Notes (Signed)
Procedure Name: Intubation Date/Time: 11/22/2011 1:01 PM Performed by: Harriett Rush, Mekhi Lascola ADEDAYO Pre-anesthesia Checklist: Patient identified, Patient being monitored, Emergency Drugs available, Timeout performed and Suction available Patient Re-evaluated:Patient Re-evaluated prior to inductionOxygen Delivery Method: Circle System Utilized Preoxygenation: Pre-oxygenation with 100% oxygen Intubation Type: IV induction Ventilation: Mask ventilation without difficulty Laryngoscope Size: Miller and 2 Grade View: Grade I Tube type: Oral Tube size: 7.0 mm Number of attempts: 1 Placement Confirmation: ETT inserted through vocal cords under direct vision,  breath sounds checked- equal and bilateral,  positive ETCO2 and CO2 detector Secured at: 22 cm Tube secured with: Tape Dental Injury: Teeth and Oropharynx as per pre-operative assessment

## 2011-11-22 NOTE — Anesthesia Preprocedure Evaluation (Signed)
Anesthesia Evaluation  Patient identified by MRN, date of birth, ID band Patient awake    Reviewed: Allergy & Precautions, H&P , NPO status , Patient's Chart, lab work & pertinent test results, reviewed documented beta blocker date and time   History of Anesthesia Complications Negative for: history of anesthetic complications  Airway Mallampati: I TM Distance: >3 FB Neck ROM: full    Dental  (+) Chipped   Pulmonary neg pulmonary ROS,  clear to auscultation  Pulmonary exam normal       Cardiovascular Exercise Tolerance: Good hypertension, On Medications regular Normal    Neuro/Psych Negative Neurological ROS  Negative Psych ROS   GI/Hepatic Neg liver ROS, GERD- (no meds)  ,  Endo/Other  Negative Endocrine ROS  Renal/GU negative Renal ROS  Genitourinary negative   Musculoskeletal   Abdominal   Peds  Hematology negative hematology ROS (+)   Anesthesia Other Findings   Reproductive/Obstetrics negative OB ROS                           Anesthesia Physical Anesthesia Plan  ASA: II  Anesthesia Plan: General ETT   Post-op Pain Management:    Induction:   Airway Management Planned:   Additional Equipment:   Intra-op Plan:   Post-operative Plan:   Informed Consent: I have reviewed the patients History and Physical, chart, labs and discussed the procedure including the risks, benefits and alternatives for the proposed anesthesia with the patient or authorized representative who has indicated his/her understanding and acceptance.   Dental Advisory Given  Plan Discussed with: CRNA and Surgeon  Anesthesia Plan Comments:         Anesthesia Quick Evaluation

## 2011-11-22 NOTE — Interval H&P Note (Signed)
History and Physical Interval Note:  11/22/2011 12:41 PM  Madeline Hawkins  has presented today for surgery, with the diagnosis of pelvic pain,ovarian cyst, fibroid uterus  The various methods of treatment have been discussed with the patient and family. After consideration of risks, benefits and other options for treatment, the patient has consented to  Procedure(s): HYSTERECTOMY TOTAL LAPAROSCOPIC, possible left SALPINGO OOPHERECTOMY as a surgical intervention .  The patients' history has been reviewed, patient examined, no change in status, stable for surgery.  I have reviewed the patients' chart and labs.  Questions were answered to the patient's satisfaction.     Ok Edwards

## 2011-11-22 NOTE — Anesthesia Postprocedure Evaluation (Signed)
  Anesthesia Post-op Note  Patient: Madeline Hawkins  Procedure(s) Performed:  LAPAROSCOPY DIAGNOSTIC; HYSTERECTOMY ABDOMINAL; OVARIAN CYSTECTOMY  Patient is awake and responsive. Pain and nausea are reasonably well controlled. Vital signs are stable and clinically acceptable. Oxygen saturation is clinically acceptable. There are no apparent anesthetic complications at this time. Patient is ready for discharge.

## 2011-11-22 NOTE — Op Note (Signed)
11/22/2011  3:10 PM  PATIENT:  Madeline Hawkins  46 y.o. female symptomatic leiomyomatous uteri and left ovarian cyst.  PRE-OPERATIVE DIAGNOSIS:  Pelvic pain left ovarian cyst, fibroid uterus  POST-OPERATIVE DIAGNOSIS:  Pelvic pain left,ovarian cyst, fibroid uterus  ANESTHESIA:   general  Attempted laparoscopic hysterectomy HYSTERECTOMY ABDOMINAL OVARIAN CYSTECTOMY (left)  SURGEON:  Surgeon(s): Ok Edwards, MD Dara Lords, MD   FINDINGS: An approximately 10 week size leiomyomatous uteri encroaching the cul-de-sac most of the fibroids were intramural. Small right hemorrhagic cyst. Left ovarian cyst measuring 5 x 6 cm with smooth surface appears to be an endometrioma will await pathology report for confirmation. Normal-appearing appendix.   :  Procedure(DESCRIPTION OF OPERATION:s): The patient was taken to the operating room where she underwent a successful general endotracheal anesthesia. Patient had received 1 g of Cefotan preoperatively and had PAS stockings for DVT prophylaxis. The abdomen vagina and perineum were prepped and draped in usual sterile fashion. Bimanual examination demonstrated an approximately 8 week size fibroid uterus. A weighted speculum was placed in the posterior vaginal vault a single-tooth tenaculum was then placed in the anterior cervical lip. The uterus sounded to 10-1/2 cm. The Rhumy uterine manipulator tip was attached after  the appropriate depth of the uterine cavity was established followed by placement of a pneumo-occluder and the appropriate Koh  cup size. This was then inserted into the uterine cavity and the intrauterine bulb was instilled with 5 cc of normal saline. The single-tooth tenaculum had been removed a Foley catheter was in place and the drapes were then placed. A small vertical incision was made underneath the umbilicus followed by insertion of the 10/11 mm Optiview trocar. Systematic inspection of the pelvic cavity demonstrated a large  fibroid uterus with intramural fundal fibroid encroaching into the cul-de-sac and a large 5 x 6 cm left ovarian cyst and a small right ruptured ovarian hemorrhagic cyst. It appears that clinically was going to be difficult to proceed with the operation laparoscopically so additional lap ports were not made. The trocar and laparoscope were removed. The subumbilical fascia was closed with figure-of-eight of 0 Vicryl suture and then a Pfannenstiel incision was made 2 cm above the symphysis pubis. The incision was carried out on the skin subcutaneous tissue down to the rectus fascia were by midline nick was made in the fascia was incised in a transverse fashion. The peritoneal cavity was entered and the patient was then placed in Trendelenburg position after the O'Connor-O'Sullivan retractor were in place. Systematic inspection demonstrated once again the large ovarian cyst and a large fibroid uterus which was relieved from the cul-de-sac for better manipulation during the open hysterectomy procedure. Attention was placed to the proximal portion of thelast fallopian tube and utero-ovarian ligament and a a Heaney clamp was placed over these structures. The plan was to remove the uterus and cervix and then go back and do the left ovarian cystectomy. Once the proximal fallopian tube and utero-ovarian ligament was transected the pedicle was secured with a free tie of 0 Vicryl suture followed by transfixation stitch. The left uterosacral ligament was transected with the Bovie cautery and the remainder of the broad and cardinal ligaments were sterilely clamped cut and suture ligated with 0 Vicryl suture to the level of the left vaginal fornix. The lower uterine cervix peritoneum was incised to free the bladder downward. Attention was then placed on the right adnexa. The right round ligament was identified and transected and with the surgeon's finger the  posterior broad ligament was penetrated and the proximal portion of the  right tube and utero-ovarian ligament were clamped cut and free tied with 0 Vicryl suture followed by transfixation stitch. The remainder of the broad and cardinal ligaments were serially clamped cut and suture ligated with 0 Vicryl suture to the level the right fornix and the bladder was peeled off from the lower uterine segment. The uterus was amputated off the cervix to allow visualization to free the cervix. With a scalpel the cervix was freed from the lower uterine segment passed off the operative field. 2 Heaney clamps were placed in the right and left fornices and with the use of drugs and scissors the cervix was freed from the vagina. Both angles were secured with a transfixation stitch of 0 Vicryl suture and the vaginal cuff was closed with figure-of-eight of 0 Vicryl suture. Pelvic washings had been obtained and submitted for cytological evaluation. Attention was then placed in the left ovarian cyst. With a fine-tip Bovie the cyst was excised from the left ovary and submitted for histological evaluation the ovarian capsule was closed with a running stitch of 3-0 Vicryl suture. The pelvic cavity was then copiously irrigated with normal saline solution and Interceed was placed over the left ovarian cystectomy site as well as the vaginal cuff. Sponge count needle count were correct. The O'Connor-O'Sullivan retractor was removed. The peritoneum was not reapproximated. The rectus fascia was closed with a running stitch of 0 Vicryl suture. The subcutaneous bleeders were Bovie cauterized and the skin was reapproximated with skin clips followed by Xeroform gauze and 4 x 4 dressing. Of note the subumbilical skin was reapproximated with staples as well. For postoperative analgesia quarter percent Marcaine was infiltrated in both incision sites for a total of 10 cc. Patient was extubated transferred to recovery room with stable vital signs. She was to receive Toradol 30 mg IV in route to the recovery  room.   ESTIMATED BLOOD LOSS: 100cc's  Intake/Output Summary (Last 24 hours) at 11/22/11 1510 Last data filed at 11/22/11 1430  Gross per 24 hour  Intake   2000 ml  Output    400 ml  Net   1600 ml     BLOOD ADMINISTERED:none   LOCAL MEDICATIONS USED:  MARCAINE 10CC subcutaneous  SPECIMEN:  Source of Specimen:  Uterus cervix and left ovarian cyst and pelvic washings  DISPOSITION OF SPECIMEN:  PATHOLOGY  COUNTS:  YES  PLAN OF CARE: Transfer to PACU  [facility] HMD3:10 PMTD@

## 2011-11-22 NOTE — H&P (View-Only) (Signed)
Madeline Hawkins is an 45 y.o. female who was seen in the office on January 10 with the following history:  45 y.o. for annual exam and had stated that she was receiving her medical exam and Pap smear had internal medical practice. She stated that on Sunday she began having periumbilical discomfort radiating to her right lower quadrant no nausea or vomiting no change in bowel movement habits. With her primary provider (Monica Lyall NP) for evaluation and blood work have been drawn to include a normal CBC normal comprehensive metabolic panel and had ordered an ultrasound with the following findings:  Uterus measured 5.3 x 7.9 x 9.3 cm with an endometrial stripe of 0.7 cm. Right uterine fundus myoma was noted measuring 5.4 x 5.2 x 5.4 cm normal right ovary and the left ovary was normal size but contained a complex cyst was low level echoes which measured 4.5 x 3.8 x 5.3 cm with an appearance of a hemorrhagic cyst. The appendix was not identified on the ultrasound and recommendations for CT for further evaluation the patient persisted with the symptoms. Patient stated presented to the office here today for further evaluation.  Patient states that she's been having normal menstrual cycle she does her monthly self breast examination. She has a history of left salpingectomy for an ectopic pregnancy and a right tubal ligation.  Her CT scan later in the day demonstrated the following:  Small amount free fluid in the posterior cul-de-sac. There is  interval enlargement of a rounded mass within the right side of the  uterine body measuring 4.4 x 5.4 cm increased from 2.2 x 2.2 cm on  prior. There is a 3.8 cm cystic lesion within the left ovary which  does not have simple fluid attenuation but is relatively low. The  right ovary is normal. No pelvic lymphadenopathy.  IMPRESSION:  1. Normal appendix.  2. Interval enlargement of a rounded mass within the right aspect  the uterus most likely represents an  enlarging benign leiomyoma.  3. Probable hemorrhagic cyst within the left ovary.    Pertinent Gynecological History: Menses: flow is moderate Bleeding: Regular  Contraception: tubal ligation DES exposure: denies Blood transfusions: none Sexually transmitted diseases: no past history Previous GYN Procedures: cryo  Last mammogram: normal Date: Does not recall Last pap: normal Date: 2013 OB History: G5, P4,A1 (left ectopic/left salpingectomy/right tubal ligation)   Menstrual History: Menarche age: H. 14  Patient's last menstrual period was 11/10/2011.    Past Medical History  Diagnosis Date  . CIN I (cervical intraepithelial neoplasia I) 1989    cryo...also in 2008 .. had cryo   . Hypertension     Past Surgical History  Procedure Date  . Salpingectomy     left for ectopic pregnancy  . Gynecologic cryosurgery 07/07/2007  . Tubal ligation     right tubal ligation    Family History  Problem Relation Age of Onset  . Diabetes Brother   . Cancer Mother     OVARIAN  . Cancer Paternal Aunt     OVARIAN  . Heart disease Maternal Grandmother     Social History:  reports that she has never smoked. She has never used smokeless tobacco. She reports that she does not drink alcohol or use illicit drugs.  Allergies: No Known Allergies   (Not in a hospital admission)  @ROS@  Blood pressure 110/70, last menstrual period 11/10/2011.  Physical Exam:  HEENT:unremarkable Neck:Supple, midline, no thyroid megaly, no carotid bruits Lungs:  Clear   to auscultation no rhonchi's or wheezes Heart:Regular rate and rhythm, no murmurs or gallops Breast Exam:Examined recently normal  Abdomen:Soft tenderness in the right lower quadrant  Pelvic:BUSwithin normal limits  Vagina:No vaginal lesions or discharge  Cervix:No lesions or discharge  Uterus:Uterus irregular shaped of the right fundal region/fibroid  Adnexa:Slight fullness left adnexa  Extremities: No cords, no edema Rectal:Not  done  No results found for this or any previous visit (from the past 24 hour(s)).  No results found.  Assessment/Plan: Patient was seen today for preoperative consultation as a result of her symptomatic uterine fibroid dysmenorrhea and menorrhagia. Her hemoglobin on January 10 was 11.6. Ultrasound and CT demonstrating a left ovarian cyst measuring 4.5 x 3.8 x 5.3 cm and a fundal fibroid measuring 5.4 x 5.2 x 5.4 cm. Patient was counseled for total laparoscopic hysterectomy with left ovarian cystectomy possible left salpingo-oophorectomy. Literature information previously provided outlining the procedure in detail. The risks benefits and pros and cons of the operation discussed are as follows: Risk of deep venous thrombosis and pulmonary embolism (patient will have PAS stockings for prophylaxis), the risk of infection (she'll receive intravenous antibiotics), the risk of hemorrhage (if she were to need a blood transfusion she is fully aware potential risk of anaphylactic reaction hepatitis and AIDS), risk of trauma and injury to internal organs requiring open laparotomy to repair any internal injury. Also in the event that the operation is not able to be completed laparoscopically an open abdominal approach may need to be done which will require the patient to be in the hospital additional days. Patient is fully aware of all the above all questions are answered and we'll follow accordingly.  FERNANDEZ,JUAN H 11/13/2011, 11:35 AM   

## 2011-11-23 LAB — CBC
HCT: 33.4 % — ABNORMAL LOW (ref 36.0–46.0)
Hemoglobin: 10.8 g/dL — ABNORMAL LOW (ref 12.0–15.0)
MCV: 84.3 fL (ref 78.0–100.0)
Platelets: 217 10*3/uL (ref 150–400)
RBC: 3.96 MIL/uL (ref 3.87–5.11)
WBC: 11.6 10*3/uL — ABNORMAL HIGH (ref 4.0–10.5)

## 2011-11-23 MED ORDER — METOCLOPRAMIDE HCL 10 MG PO TABS: 10.0000 mg | ORAL_TABLET | Freq: Four times a day (QID) | ORAL | Status: DC | PRN

## 2011-11-23 MED ORDER — OXYCODONE-ACETAMINOPHEN 5-325 MG PO TABS
1.0000 | ORAL_TABLET | Freq: Four times a day (QID) | ORAL | Status: DC | PRN
  Administered 2011-11-23: 1 via ORAL
  Administered 2011-11-23 – 2011-11-24 (×4): 2 via ORAL
  Filled 2011-11-23 (×5): qty 2

## 2011-11-23 MED ORDER — LISINOPRIL 10 MG PO TABS
10.0000 mg | ORAL_TABLET | Freq: Every day | ORAL | Status: DC
  Filled 2011-11-23 (×3): qty 1

## 2011-11-23 MED ORDER — HYDROCHLOROTHIAZIDE 12.5 MG PO CAPS
12.5000 mg | ORAL_CAPSULE | Freq: Every day | ORAL | Status: DC
  Filled 2011-11-23 (×3): qty 1

## 2011-11-23 NOTE — Addendum Note (Signed)
Addendum  created 11/23/11 0755 by Isabella Bowens, CRNA   Modules edited:Notes Section

## 2011-11-23 NOTE — Progress Notes (Signed)
1 Day Post-Op Procedure(s): LAPAROSCOPY DIAGNOSTIC HYSTERECTOMY ABDOMINAL l (left) OVARIAN CYSTECTOMY, pelvic washings  Subjective: Patient reports incisional pain.    Objective: I have reviewed patient's vital signs, intake and output, medications and labs. Hemoglobin was 11.6.  Resp: clear to auscultation bilaterally Cardio: regular rate and rhythm, S1, S2 normal, no murmur, click, rub or gallop GI: soft, non-tender; bowel sounds normal; no masses,  no organomegaly Extremities: extremities normal, atraumatic, no cyanosis or edema Vaginal Bleeding: none  Assessment: s/p Procedure(s): LAPAROSCOPY DIAGNOSTIC HYSTERECTOMY ABDOMINAL OVARIAN CYSTECTOMY: stable, progressing well and With good clear urine output and good bowel sounds in 4 quadrants. Incision site intact.  Plan: Clear liquids We'll discontinue her Foley catheter and her PCA pump. We'll start her on Percocet and Reglan when necessary. We'll restart her antihypertensive medication (lisinopril/HCTZ) later today. We'll encourage ambulation. We'll advance diet when she starts passing gas.  LOS: 1 day    Artist Bloom H 11/23/2011, 9:24 AM

## 2011-11-23 NOTE — Progress Notes (Signed)
UR Chart review completed.  

## 2011-11-23 NOTE — Anesthesia Postprocedure Evaluation (Signed)
  Anesthesia Post-op Note  Patient: Madeline Hawkins  Procedure(s) Performed:  LAPAROSCOPY DIAGNOSTIC; HYSTERECTOMY ABDOMINAL; OVARIAN CYSTECTOMY  Patient Location: PACU and Mother/Baby  Anesthesia Type: General  Level of Consciousness: awake, alert  and oriented  Airway and Oxygen Therapy: Patient Spontanous Breathing and Patient connected to nasal cannula oxygen  Post-op Pain: none  Post-op Assessment: Post-op Vital signs reviewed and Patient's Cardiovascular Status Stable  Post-op Vital Signs: Reviewed and stable  Complications: No apparent anesthesia complications

## 2011-11-24 MED ORDER — HYDROCODONE-ACETAMINOPHEN 7.5-500 MG PO TABS: 1.0000 | ORAL_TABLET | Freq: Four times a day (QID) | ORAL | Status: AC | PRN

## 2011-11-24 MED ORDER — METOCLOPRAMIDE HCL 10 MG PO TABS: 10.0000 mg | ORAL_TABLET | Freq: Four times a day (QID) | ORAL | Status: DC | PRN

## 2011-11-24 NOTE — Discharge Summary (Signed)
Physician Discharge Summary  Patient ID: Madeline Hawkins MRN: [id number] DOB/AGE: Jun 15, 1966 46 y.o.  Admit date: 11/22/2011 Discharge date: 11/24/2011  Admission Diagnoses: Pelvic pain, and leiomyomatous uteri, left ovarian cyst  Discharge Diagnoses: Pelvic pain, and leiomyomatous uteri, a left ovarian cyst  Active Problems:  * No active hospital problems. *    Discharged Condition: good  Hospital Course: Patient was admitted to the hospital on January 24 for scheduled total laparoscopic hysterectomy with left ovarian cystectomy. Due to the large size of the uterus a total, hysterectomy with left ovarian cystectomy and pelvic washings first done instead. Patient with evidence of previous tubal sterilization procedure. Postoperatively the patient did well for less than 24 hours she had a Foley catheter with adequate urinary output. She remained afebrile her diet was advanced to clear liquids a regular diet. This morning she was up ambulating tolerating regular diet and had passed gas. And was voiding without difficulty. Her incision sites were intact and she was rated be discharged home. Patient with history of hypertension had been on lisinopril/HCTZ and was not started because she remained normotensive during her hospitalization stay. Preop hemoglobin was 12.5 postop hemoglobin 10.8. Patient to resume her antihypertensive medication after she comes to the office next week to have her staples removed and to have her blood pressure checked. Her blood pressure reading this morning was 116/80.  Significant Diagnostic Studies: Postop hemoglobin 10.8. Pelvic washings cytology benign. Final pathology report pending.  Treatments: Attempted total laparoscopic hysterectomy converted to abdominal hysterectomy with left ovarian cystectomy.  Discharge Exam: Blood pressure 116/80, pulse 88, temperature 97.5 F (36.4 C), temperature source Oral, resp. rate 18, height 5\' 3"  (1.6 m), weight 192 lb (87.091 kg),  last menstrual period 10/19/2011, SpO2 96.00%. General appearance: alert and cooperative Resp: clear to auscultation bilaterally Cardio: regular rate and rhythm, S1, S2 normal, no murmur, click, rub or gallop GI: soft, non-tender; bowel sounds normal; no masses,  no organomegaly Extremities: extremities normal, atraumatic, no cyanosis or edema No vaginal bleeding reported incision site intact.  Disposition: Home or Self Care  Discharge Orders    Future Appointments: Provider: Department: Dept Phone: Center:   11/27/2011 9:00 AM Ok Edwards, MD [facility] [phone] [facility]     Future Orders Please Complete By Expires   Resume previous diet      Lifting restrictions      Comments:   No lifting for 6 weeks or sexual activity.   Call MD for:  temperature >100.5      Call MD for:  redness, tenderness, or signs of infection (pain, swelling, bleeding, redness, odor or green/yellow discharge around incision site)      Call MD for:  severe or increased pain, loss or decreased feeling  in affected limb(s)      Discharge instructions      Comments:   Followup appointment Tuesday, January 29 to have her staples removed.   Driving Restrictions      Comments:   No driving for 1 weeks   may wash over wound with mild soap and water        Medication List  As of 11/24/2011 10:07 AM   STOP taking these medications         HYDROcodone-acetaminophen 5-500 MG per tablet      lisinopril-hydrochlorothiazide 10-12.5 MG per tablet         TAKE these medications         HYDROcodone-acetaminophen 7.5-500 MG per tablet   Commonly  known as: LORTAB   Take 1 tablet by mouth every 6 (six) hours as needed for pain.      metoCLOPramide 10 MG tablet   Commonly known as: REGLAN   Take 1 tablet (10 mg total) by mouth every 6 (six) hours as needed.             SignedOk Edwards 11/24/2011, 10:07 AM

## 2011-11-24 NOTE — Plan of Care (Signed)
Problem: Discharge Progression Outcomes Goal: Discontinue staples (if applicable) Outcome: Not Applicable Date Met:  11/24/11 To be removed in office on 1/29

## 2011-11-24 NOTE — Progress Notes (Signed)
2 Days Post-Op Procedure(s): LAPAROSCOPY DIAGNOSTIC HYSTERECTOMY ABDOMINAL(l) OVARIAN CYSTECTOMY  Subjective: Patient reports + flatus and no problems voiding.    Objective: I have reviewed patient's vital signs, intake and output, medications, labs and pathology.  General: alert and cooperative Resp: clear to auscultation bilaterally Cardio: regular rate and rhythm, S1, S2 normal, no murmur, click, rub or gallop GI: soft, non-tender; bowel sounds normal; no masses,  no organomegaly Extremities: extremities normal, atraumatic, no cyanosis or edema Vaginal Bleeding: none  Assessment: s/p Procedure(s): LAPAROSCOPY DIAGNOSTIC HYSTERECTOMY ABDOMINAL (left) OVARIAN CYSTECTOMY: stable, progressing well, tolerating diet and Patient ambulating voiding well no major complaints rated be discharged home.  Plan: Discharge home  LOS: 2 days    FERNANDEZ,JUAN H 11/24/2011, 9:56 AM

## 2011-11-24 NOTE — Progress Notes (Signed)
11/24/11 1145 D/C home instructions/prescriptions given, pt/families questions answered.

## 2011-11-26 ENCOUNTER — Encounter (HOSPITAL_COMMUNITY): Payer: Self-pay | Admitting: Gynecology

## 2011-11-27 ENCOUNTER — Encounter: Payer: Self-pay | Admitting: Gynecology

## 2011-11-27 ENCOUNTER — Ambulatory Visit (INDEPENDENT_AMBULATORY_CARE_PROVIDER_SITE_OTHER): Payer: Managed Care, Other (non HMO) | Admitting: Gynecology

## 2011-11-27 VITALS — BP 130/86

## 2011-11-27 DIAGNOSIS — Z9889 Other specified postprocedural states: Secondary | ICD-10-CM

## 2011-11-27 NOTE — Patient Instructions (Addendum)
No lifting or strenuous activity for 6 weeks. Shower baths only. You can remove the sterri strips Sunday while in the shower. You may start your blood pressure medication

## 2011-11-27 NOTE — Progress Notes (Signed)
Patient presented to the office today to have her staples removed. She is status post diagnostic laparoscopy, abdominal hysterectomy, with left ovarian cystectomy on 11/22/2011 as a result of symptomatic leiomyomatous uteri and left ovarian cyst. Final pathology report as follows:  REPORT OF SURGICAL PATHOLOGY FINAL DIAGNOSIS Diagnosis Uterus and cervix, and left ovarian cyst - CERVIX: CHRONIC INFLAMMATION. - ENDOMETRIUM: SECRETORY TYPE ENDOMETRIUM. - MYOMETRIUM: LEIOMYOMATA. - SEROSA: ADHESIONS. - LEFT ADNEXA: HEMORRHAGIC CYST  Patient is doing well using less than the narcotic for pain relief. Subumbilical incision in Pfannenstiel incision intact. Staples removed. Incision was Steri-Stripped. Patient was instructed to restart her blood pressure medication that she had been on prior to surgery. She was instructed to start taking iron tablet since her postop hemoglobin was 10.8. She'll return back to the office in 2 weeks for first postop appointment.

## 2011-12-11 ENCOUNTER — Ambulatory Visit (INDEPENDENT_AMBULATORY_CARE_PROVIDER_SITE_OTHER): Payer: Managed Care, Other (non HMO) | Admitting: Gynecology

## 2011-12-11 ENCOUNTER — Encounter: Payer: Self-pay | Admitting: Gynecology

## 2011-12-11 VITALS — BP 120/82

## 2011-12-11 DIAGNOSIS — Z9889 Other specified postprocedural states: Secondary | ICD-10-CM

## 2011-12-11 DIAGNOSIS — D649 Anemia, unspecified: Secondary | ICD-10-CM

## 2011-12-11 NOTE — Patient Instructions (Signed)
The Maderma is to help prevent scaring at incision site. Apply 2-3 times a day for 1-2 months. You are doing great. Will see you in 1 month. Continue with the daily iron tablet.

## 2011-12-11 NOTE — Progress Notes (Addendum)
Patient presented to the office today for her first postop visit she is status post attempted laparoscopic hysterectomy resulting abdominal hysterectomy in as a result of her leiomyomatous uteri and left ovarian cystectomy. Patient had an iron deficiency anemia for which she's currently on iron supplementation and is otherwise doing well with no complaints today.  Pathology report had been discussed with the patient on visit January 29 when she came in to have her staples removed. Pathology reported demonstrated benign leiomyomatous uteri serosal adhesions and a left adnexal hemorrhagic cyst.  Exam: Abdomen: Pfannenstiel incision intact. Soft nontender no rebound or guarding Pelvic: Bartholin urethra Skene glands Vagina and cuff: No lesions or discharge Bimanual exam: No palpable masses or tenderness Rectal exam: Not done  Assessment/plan: Patient status post abdominal myomectomy with left ovarian cystectomy doing well 2 weeks postop. Patient to continue her iron supplementation. At time of discharge from the hospital her hemoglobin was 10.8. Patient was instructed to apply Maderma cream to Pfannenstiel incision site to decrease the incidence of scar tissue formation. She'll return back in the office in 4 weeks for final postop visit at which time we'll be checking her CBC.   Correction: Typo error above should read: Status post abdominal hysterectomy with left ovarian cystectomy

## 2011-12-25 ENCOUNTER — Encounter: Payer: Self-pay | Admitting: *Deleted

## 2011-12-25 NOTE — Progress Notes (Signed)
Patient ID: Madeline Hawkins, female   DOB: 1966/08/16, 46 y.o.   MRN: [id number] Pt left message regarding starting work early, pt is post op surgery. Left message for pt to call.

## 2012-01-08 ENCOUNTER — Encounter: Payer: Self-pay | Admitting: Gynecology

## 2012-01-08 ENCOUNTER — Ambulatory Visit (INDEPENDENT_AMBULATORY_CARE_PROVIDER_SITE_OTHER): Payer: Managed Care, Other (non HMO) | Admitting: Gynecology

## 2012-01-08 ENCOUNTER — Other Ambulatory Visit: Payer: Self-pay | Admitting: Gynecology

## 2012-01-08 VITALS — BP 122/80

## 2012-01-08 DIAGNOSIS — D649 Anemia, unspecified: Secondary | ICD-10-CM

## 2012-01-08 DIAGNOSIS — Z9889 Other specified postprocedural states: Secondary | ICD-10-CM

## 2012-01-08 DIAGNOSIS — Z1231 Encounter for screening mammogram for malignant neoplasm of breast: Secondary | ICD-10-CM

## 2012-01-08 NOTE — Progress Notes (Signed)
Patient presented to the office today for her final six-week postop visit. Patient status post abdominal hysterectomy secondary to symptomatic leiomyomatous uteri and left ovarian cyst. Patient is doing well with no complaints today.  Exam: Abdomen: Soft nontender no rebound or guarding Pfannenstiel incision intact Pelvic: Bartholin urethra Skene was within normal limits Vagina: No gross lesions on inspection vaginal cuff intact Bimanual exam: No palpable masses or tenderness Rectal: Not examined  Assessment/plan: Patient status post abdominal hysterectomy with left ovarian cystectomy 6 weeks out doing well. Normal postop exam. Patient released to return back to work. Will followup in one year or when necessary. We will be checking her hemoglobin since she is on iron supplementation due to the fact that time of discharge from the hospital her hemoglobin was 10.8. If her indices return back to normal she'll discontinue iron supplementation. She may resume to normal activity. Requisition to schedule her mammogram which is overdue was provided today.

## 2012-01-08 NOTE — Patient Instructions (Addendum)
Remember to schedule your mammogram and to do monthly self breast examination. We will see you next year or sooner if you need anything from [place].

## 2012-01-09 LAB — CBC WITH DIFFERENTIAL/PLATELET
Basophils Absolute: 0 10*3/uL (ref 0.0–0.1)
Eosinophils Absolute: 0.1 10*3/uL (ref 0.0–0.7)
Eosinophils Relative: 1 % (ref 0–5)
MCH: 26.3 pg (ref 26.0–34.0)
MCV: 86.6 fL (ref 78.0–100.0)
Platelets: 277 10*3/uL (ref 150–400)
RDW: 15.9 % — ABNORMAL HIGH (ref 11.5–15.5)

## 2012-02-04 ENCOUNTER — Ambulatory Visit (INDEPENDENT_AMBULATORY_CARE_PROVIDER_SITE_OTHER): Payer: Managed Care, Other (non HMO) | Admitting: Gynecology

## 2012-02-04 ENCOUNTER — Encounter: Payer: Self-pay | Admitting: Gynecology

## 2012-02-04 VITALS — BP 132/90

## 2012-02-04 DIAGNOSIS — F419 Anxiety disorder, unspecified: Secondary | ICD-10-CM | POA: Insufficient documentation

## 2012-02-04 DIAGNOSIS — I10 Essential (primary) hypertension: Secondary | ICD-10-CM

## 2012-02-04 DIAGNOSIS — F411 Generalized anxiety disorder: Secondary | ICD-10-CM

## 2012-02-04 MED ORDER — ALPRAZOLAM 0.25 MG PO TABS: ORAL_TABLET | ORAL | Status: DC

## 2012-02-04 NOTE — Patient Instructions (Addendum)
Anxiety and Panic Attacks Your caregiver has informed you that you are having an anxiety or panic attack. There may be many forms of this. Most of the time these attacks come suddenly and without warning. They come at any time of day, including periods of sleep, and at any time of life. They may be strong and unexplained. Although panic attacks are very scary, they are physically harmless. Sometimes the cause of your anxiety is not known. Anxiety is a protective mechanism of the body in its fight or flight mechanism. Most of these perceived danger situations are actually nonphysical situations (such as anxiety over losing a job). CAUSES  The causes of an anxiety or panic attack are many. Panic attacks may occur in otherwise healthy people given a certain set of circumstances. There may be a genetic cause for panic attacks. Some medications may also have anxiety as a side effect. SYMPTOMS  Some of the most common feelings are:  Intense terror.   Dizziness, feeling faint.   Hot and cold flashes.   Fear of going crazy.   Feelings that nothing is real.   Sweating.   Shaking.   Chest pain or a fast heartbeat (palpitations).   Smothering, choking sensations.   Feelings of impending doom and that death is near.   Tingling of extremities, this may be from over-breathing.   Altered reality (derealization).   Being detached from yourself (depersonalization).  Several symptoms can be present to make up anxiety or panic attacks. DIAGNOSIS  The evaluation by your caregiver will depend on the type of symptoms you are experiencing. The diagnosis of anxiety or panic attack is made when no physical illness can be determined to be a cause of the symptoms. TREATMENT  Treatment to prevent anxiety and panic attacks may include:  Avoidance of circumstances that cause anxiety.   Reassurance and relaxation.   Regular exercise.   Relaxation therapies, such as yoga.   Psychotherapy with a  psychiatrist or therapist.   Avoidance of caffeine, alcohol and illegal drugs.   Prescribed medication.  SEEK IMMEDIATE MEDICAL CARE IF:   You experience panic attack symptoms that are different than your usual symptoms.   You have any worsening or concerning symptoms.  Document Released: 10/15/2005 Document Revised: 10/04/2011 Document Reviewed: 02/16/2010 [facility] Patient Information 2012 [place], [place].  Remember to follow up with Dr. Allyne Gee in reference to your blood pressure. You are overdue for exam with him.Appointment this Friday at 10:30 am. Blood pressure medication at your pharmacy to pick up. The xanax take one with breakfast and the second tablet if you need it at dinner time.

## 2012-02-04 NOTE — Progress Notes (Signed)
Patient is a 46 year old who was seen in the office on March 12 for her final 6 weeks postop visit after a total abdominal hysterectomy and left ovarian cystectomy. Pathology report have been benign. Patient stated today when she went to work she began crying but on further questioning it appears that she has some issues with a coworker who is making life little bit difficult for her. She also has history of hypertension for which she's been followed by Dr. Allyne Gee and has not seen him in over year.  Exam: Blood pressure 132/90 Well developed well nourished female, teary eyed Lungs: Clear to auscultation Rogers or wheezes Heart: Regular rate and rhythm no murmurs or gallops Extremities: No edema  Assessment/plan: Patient hypertensive and has not taken her medication for 4 days and she ran out. We contacted her internist office and we will call in lisinopril/HCTZ which she was on. An appointment was made for her to followup with him at the end of the week. We had a lengthy discussion on the difference between anxiety and depression. Patient with no suicidal ideation good family support no issues at home or any family member. It appears that she is more anxious about her job performance. When she returned back from surgery she had made a minor mistake at work in which one of her coworkers has continued to reminder and she has felt bad. She will go to the appropriate supervisors and meanwhile will help with anxiety by placing her on Xanax 0.25 mg to take 1 by mouth in the morning with breakfast any sex one if needed dinnertime. We'll give 6 months prescription. If this does not help her symptoms she will contact the office and we'll make the appropriate arrangements for her to see a therapist.

## 2012-02-11 ENCOUNTER — Ambulatory Visit
Admission: RE | Admit: 2012-02-11 | Discharge: 2012-02-11 | Disposition: A | Discharge status: Home / Self Care | Payer: Managed Care, Other (non HMO) | Source: Ambulatory Visit | Attending: Gynecology | Admitting: Gynecology

## 2012-02-11 DIAGNOSIS — Z1231 Encounter for screening mammogram for malignant neoplasm of breast: Secondary | ICD-10-CM

## 2012-02-13 ENCOUNTER — Other Ambulatory Visit: Payer: Self-pay | Admitting: *Deleted

## 2012-02-13 DIAGNOSIS — N63 Unspecified lump in unspecified breast: Secondary | ICD-10-CM

## 2012-02-15 ENCOUNTER — Ambulatory Visit
Admission: RE | Admit: 2012-02-15 | Discharge: 2012-02-15 | Disposition: A | Discharge status: Home / Self Care | Payer: Managed Care, Other (non HMO) | Source: Ambulatory Visit | Attending: Gynecology | Admitting: Gynecology

## 2012-02-15 ENCOUNTER — Other Ambulatory Visit: Payer: Self-pay | Admitting: *Deleted

## 2012-02-15 DIAGNOSIS — N63 Unspecified lump in unspecified breast: Secondary | ICD-10-CM

## 2012-07-14 ENCOUNTER — Other Ambulatory Visit: Payer: Self-pay | Admitting: Gynecology

## 2012-07-14 DIAGNOSIS — N6320 Unspecified lump in the left breast, unspecified quadrant: Secondary | ICD-10-CM

## 2012-08-04 ENCOUNTER — Encounter: Payer: Self-pay | Admitting: Gynecology

## 2012-08-04 ENCOUNTER — Other Ambulatory Visit: Payer: Self-pay | Admitting: Gynecology

## 2012-08-04 NOTE — Telephone Encounter (Signed)
rx called into pharmacy

## 2012-08-18 ENCOUNTER — Other Ambulatory Visit: Payer: Self-pay | Admitting: Gynecology

## 2012-08-18 ENCOUNTER — Ambulatory Visit
Admission: RE | Admit: 2012-08-18 | Discharge: 2012-08-18 | Disposition: A | Discharge status: Home / Self Care | Payer: Managed Care, Other (non HMO) | Source: Ambulatory Visit | Attending: Gynecology | Admitting: Gynecology

## 2012-08-18 ENCOUNTER — Ambulatory Visit: Payer: Managed Care, Other (non HMO)

## 2012-08-18 DIAGNOSIS — N6320 Unspecified lump in the left breast, unspecified quadrant: Secondary | ICD-10-CM

## 2012-08-18 DIAGNOSIS — N63 Unspecified lump in unspecified breast: Secondary | ICD-10-CM

## 2012-08-26 ENCOUNTER — Other Ambulatory Visit: Payer: Managed Care, Other (non HMO)

## 2012-08-26 ENCOUNTER — Ambulatory Visit
Admission: RE | Admit: 2012-08-26 | Discharge: 2012-08-26 | Disposition: A | Discharge status: Home / Self Care | Payer: Managed Care, Other (non HMO) | Source: Ambulatory Visit | Attending: Gynecology | Admitting: Gynecology

## 2012-08-26 ENCOUNTER — Other Ambulatory Visit: Payer: Self-pay | Admitting: Gynecology

## 2012-08-26 DIAGNOSIS — N63 Unspecified lump in unspecified breast: Secondary | ICD-10-CM

## 2012-11-14 ENCOUNTER — Ambulatory Visit (INDEPENDENT_AMBULATORY_CARE_PROVIDER_SITE_OTHER): Payer: Managed Care, Other (non HMO) | Admitting: Gynecology

## 2012-11-14 ENCOUNTER — Encounter: Payer: Self-pay | Admitting: Gynecology

## 2012-11-14 VITALS — BP 130/88 | Ht 63.0 in | Wt 194.0 lb

## 2012-11-14 DIAGNOSIS — Z23 Encounter for immunization: Secondary | ICD-10-CM

## 2012-11-14 DIAGNOSIS — E785 Hyperlipidemia, unspecified: Secondary | ICD-10-CM

## 2012-11-14 DIAGNOSIS — Z01419 Encounter for gynecological examination (general) (routine) without abnormal findings: Secondary | ICD-10-CM

## 2012-11-14 DIAGNOSIS — I10 Essential (primary) hypertension: Secondary | ICD-10-CM

## 2012-11-14 DIAGNOSIS — R635 Abnormal weight gain: Secondary | ICD-10-CM

## 2012-11-14 LAB — COMPREHENSIVE METABOLIC PANEL
ALT: 14 U/L (ref 0–35)
AST: 17 U/L (ref 0–37)
Alkaline Phosphatase: 47 U/L (ref 39–117)
Potassium: 4.5 mEq/L (ref 3.5–5.3)
Sodium: 140 mEq/L (ref 135–145)
Total Bilirubin: 0.5 mg/dL (ref 0.3–1.2)
Total Protein: 7.2 g/dL (ref 6.0–8.3)

## 2012-11-14 LAB — CBC WITH DIFFERENTIAL/PLATELET
Basophils Relative: 1 % (ref 0–1)
Eosinophils Absolute: 0.1 10*3/uL (ref 0.0–0.7)
Lymphs Abs: 2 10*3/uL (ref 0.7–4.0)
MCH: 27.4 pg (ref 26.0–34.0)
MCHC: 33.7 g/dL (ref 30.0–36.0)
Neutro Abs: 2.1 10*3/uL (ref 1.7–7.7)
Neutrophils Relative %: 45 % (ref 43–77)
Platelets: 240 10*3/uL (ref 150–400)
RBC: 4.68 MIL/uL (ref 3.87–5.11)

## 2012-11-14 LAB — LIPID PANEL
LDL Cholesterol: 185 mg/dL — ABNORMAL HIGH (ref 0–99)
VLDL: 13 mg/dL (ref 0–40)

## 2012-11-14 LAB — THYROID PANEL WITH TSH
Free Thyroxine Index: 3.3 (ref 1.0–3.9)
T3 Uptake: 36.4 % (ref 22.5–37.0)
T4, Total: 9.1 ug/dL (ref 5.0–12.5)

## 2012-11-14 NOTE — Patient Instructions (Addendum)
Inactivated Influenza Vaccine What You Need to Know WHY GET VACCINATED?  Influenza ("flu") is a contagious disease.  It is caused by the influenza virus, which can be spread by coughing, sneezing, or nasal secretions.  Anyone can get influenza, but rates of infection are highest among children. For most people, symptoms last only a few days. They include:  Fever or chills.  Sore throat.  Muscle aches.  Fatigue.  Cough.  Headache.  Runny or stuffy nose. Other illnesses can have the same symptoms and are often mistaken for influenza. Young children, people 65 and older, pregnant women, and people with certain health conditions, such as heart, lung or kidney disease, or a weakened immune system can get much sicker. Flu can cause high fever and pneumonia, and make existing medical conditions worse. It can cause diarrhea and seizures in children. Each year thousands of people die from influenza and even more require hospitalization. By getting flu vaccine, you can protect yourself from influenza and may also avoid spreading influenza to others. INACTIVATED INFLUENZA VACCINE There are 2 types of influenza vaccine:  Inactivated (killed) vaccine, the "flu shot", is given by injection with a needle.  Live, attenuated (weakened) influenza vaccine is sprayed into the nostrils. This vaccine is described in a separate Vaccine Information Statement. A "high-dose" inactivated influenza vaccine is available for people 65 years of age and older. Ask your doctor for more information.  Influenza viruses are always changing, so annual vaccination is recommended. Each year scientists try to match the viruses in the vaccine to those most likely to cause flu that year. Flu vaccine will not prevent disease from other viruses, including flu viruses not contained in the vaccine. It takes up to 2 weeks for protection to develop after the shot. Protection lasts about 1 year. Some inactivated influenza vaccine  contains a preservative called thimerosal. Thimerosal-free influenza vaccine is available. Ask your doctor for more information. WHO SHOULD GET INACTIVATED INFLUENZA VACCINE AND WHEN? WHO  All people 6 months of age and older should get flu vaccine.  Vaccination is especially important for people at higher risk of severe influenza and their close contacts, including healthcare personnel and close contacts of children younger than 6 months. WHEN Getting the vaccine as soon as it is available. This should provide protection if the flu season comes early. You can get the vaccine as long as illness is occurring in your community. Influenza can occur at any time, but most influenza occurs from October through May. In recent seasons, most infections have occurred in January and February. Getting vaccinated in December, or even later, will still be beneficial in most years. Adults and older children need 1 dose of influenza vaccine each year. But some children younger than 9 years of age need 2 doses to be protected. Ask your doctor. Influenza vaccine may be given at the same time as other vaccines, including pneumococcal vaccine. SOME PEOPLE SHOULD NOT GET INACTIVATED INFLUENZA VACCINE OR SHOULD WAIT  Tell your doctor if you have any severe (life-threatening) allergies, including a severe allergy to eggs. A severe allergy to any vaccine component may be a reason not to get the vaccine. Allergic reactions to influenza vaccine are rare.  Tell your doctor if you ever had a severe reaction after a dose of influenza vaccine.  Tell your doctor if you ever had Guillain-Barr syndrome (a severe paralytic illness, also called GBS). Your doctor will help you decide whether the vaccine is recommended for you.  People who are   moderately or severely ill should usually wait until they recover before getting the flu vaccine. If you are ill, talk to your doctor about whether to reschedule the vaccination. People with  a mild illness can usually get the vaccine. WHAT ARE THE RISKS FROM INACTIVATED INFLUENZA VACCINE? A vaccine, like any medicine, could possibly cause serious problems, such as severe allergic reactions. The risk of a vaccine causing serious harm, or death, is extremely small. Serious problems from inactivated influenza vaccine are very rare. The viruses in inactivated influenza vaccine have been killed, so you cannot get influenza from the vaccine. Mild problems:  Soreness, redness, or swelling where the shot was given.  Hoarseness; sore, red or itchy eyes; cough.  Fever.  Aches.  Headache.  Itching.  Fatigue. If these problems occur, they usually begin soon after the shot and last 1 to 2 days. Moderate problems: Young children who get inactivated flu vaccine and pneumococcal vaccine (PCV13) at the same time appear to be at increased risk for seizures caused by fever. Ask your doctor for more information. Tell your doctor if a child who is getting flu vaccine has ever had a seizure. Severe problems:  Life-threatening allergic reactions from vaccines are very rare. If they do occur, it is usually within a few minutes to a few hours after the shot.  In 1976, a type of inactivated influenza (swine flu) vaccine was associated with Guillan-Barr syndrome (GBS). Since then, flu vaccines have not been clearly linked to GBS. However, if there is a risk of GBS from current flu vaccines, it would be no more than 1 or 2 cases per million people vaccinated. This is much lower than the risk of severe influenza, which can be prevented by vaccination. The safety of vaccines is always being monitored. For more information, visit:  www.cdc.gov/vaccinesafety/Vaccine_Monitoring/Index.html and  www.cdc.gov/vaccinesafety/Activities/Activities_Index.html One brand of inactivated flu vaccine, called Afluria, should not be given to children 8 years of age or younger, except in special circumstances. A  related vaccine was associated with fevers and fever-related seizures in young children in [place]. Your doctor can give you more information. WHAT IF THERE IS A SEVERE REACTION? What should I look for? Any unusual condition, such as a high fever or unusual behavior. Signs of a serious allergic reaction can include difficulty breathing, hoarseness or wheezing, hives, paleness, weakness, a fast heartbeat, or dizziness. What should I do?  Call a doctor, or get the person to a doctor right away.  Tell your doctor what happened, the date and time it happened, and when the vaccination was given.  Ask your doctor, nurse, or health department to report the reaction by filing a Vaccine Adverse Event Reporting System (VAERS) form. Or, you can file this report through the VAERS website at www.vaers.hhs.gov or by calling 1-800-822-7967. VAERS does not provide medical advice. THE NATIONAL VACCINE INJURY COMPENSATION PROGRAM The National Vaccine Injury Compensation Program (VICP) was created in 1986. People who believe they may have been injured by a vaccine can learn about the program and about filing a claim by calling 1-800-338-2382, or visiting the VICP website at www.hrsa.gov/vaccinecompensation HOW CAN I LEARN MORE?  Ask your doctor. They can give you the vaccine package insert or suggest other sources of information.  Call your local or state health department.  Contact the Centers for Disease Control and Prevention (CDC):  Call 1-800-232-4636 (1-800-CDC-INFO) or  Visit the CDC's website at www.cdc.gov/flu CDC Inactivated Influenza Vaccine VIS (04/30/11) Document Released: 08/09/2006 Document Revised: 04/15/2012 Document Reviewed:   04/30/2011 [employer] Patient Information 2013 [employer].  

## 2012-11-14 NOTE — Progress Notes (Addendum)
Madeline Hawkins 1966/05/31 [id number]   History:    47 y.o.  for annual gyn exam with no complaints today. Review of her record indicated that last year she had a total abdominal hysterectomy with a left ovarian cystectomy and has done well. Patient interested in the flu shot today. Review of her record indicated in October 2013 she underwent a left breast aspiration of the cyst and was benign. She scheduled for followup mammogram in April 2014. Patient states she does her monthly self breast examination. Her primary physician that she did not recall the name is at [facility] and they have been following her for hyperlipidemia and hypertension she scheduled to see them the next few months and would like to have her fasting lab work done today here.  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  Gynecologic History Patient's last menstrual period was 11/10/2011. Contraception: status post hysterectomy Last Pap: January 2013. Results were: normal Last mammogram: See above. Results were: See above  Obstetric History OB History    Grav Para Term Preterm Abortions TAB SAB Ect Mult Living   5 4 4  1   0 1  4     # Outc Date GA Lbr Len/2nd Wgt Sex Del Anes PTL Lv   1 TRM     F SVD  No Yes   2 TRM     F SVD  No Yes   3 TRM     M SVD  No Yes   4 TRM     F SVD  No Yes   5 ECT                ROS: A ROS was performed and pertinent positives and negatives are included in the history.  GENERAL: No fevers or chills. HEENT: No change in vision, no earache, sore throat or sinus congestion. NECK: No pain or stiffness. CARDIOVASCULAR: No  chest pain or pressure. No palpitations. PULMONARY: No shortness of breath, cough or wheeze. GASTROINTESTINAL: No abdominal pain, nausea, vomiting or diarrhea, melena or bright red blood per rectum. GENITOURINARY: No urinary frequency, urgency, hesitancy or dysuria. MUSCULOSKELETAL: No joint or muscle pain, no back pain, no recent trauma. DERMATOLOGIC: No rash, no itching, no lesions. ENDOCRINE: No polyuria, polydipsia, no heat or cold intolerance. No recent change in weight. HEMATOLOGICAL: No anemia or easy bruising or bleeding. NEUROLOGIC: No headache, seizures, numbness, tingling or weakness. PSYCHIATRIC: No depression, no loss of interest in normal activity or change in sleep pattern.     Exam: chaperone present  BP 130/88  Ht 5\' 3"  (1.6 m)  Wt 194 lb (87.998 kg)  BMI 34.37 kg/m2  LMP 11/10/2011  Body mass index is 34.37 kg/(m^2).  General appearance : Well developed well nourished female. No acute distress HEENT: Neck supple, trachea midline, no carotid bruits, no thyroidmegaly Lungs: Clear to auscultation, no rhonchi or wheezes, or rib retractions  Heart: Regular rate and rhythm, no murmurs or gallops Breast:Examined in sitting and supine position were symmetrical in appearance, no palpable masses or tenderness,  no skin retraction, no nipple inversion, no nipple discharge, no skin discoloration, no axillary or supraclavicular lymphadenopathy Abdomen: no palpable masses or  tenderness, no rebound or guarding Extremities: no edema or skin discoloration or tenderness  Pelvic:  Bartholin, Urethra, Skene Glands: Within normal limits             Vagina: No gross lesions or discharge  Cervix: Absent  Uterus  Absent  Adnexa  Without masses or tenderness  Anus and perineum  normal   Rectovaginal  normal sphincter tone without palpated masses or tenderness             Hemoccult not indicated     Assessment/Plan:  47 y.o. female for annual exam doing well from her abdominal hysterectomy  last year. Patient was to receive the flu vaccine today. Patient was counseled and literature information was provided on the flu vaccine. She was reminded to do her monthly self breast examination. We discussed importance of calcium vitamin D and regular exercise for osteoporosis prevention. We discussed the new Pap smear screening guidelines. No Pap smear done today. The following labs were ordered: CBC, fasting lipid profile, comprehensive metabolic panel, TSH and urinalysis. Patient was reminded to followup with her mammogram in April of this year.    Ok Edwards MD, 9:28 AM 11/14/2012

## 2012-11-15 LAB — URINALYSIS W MICROSCOPIC + REFLEX CULTURE
Bacteria, UA: NONE SEEN
Bilirubin Urine: NEGATIVE
Crystals: NONE SEEN
Hgb urine dipstick: NEGATIVE
Ketones, ur: NEGATIVE mg/dL
Protein, ur: NEGATIVE mg/dL
Specific Gravity, Urine: >1.03 — ABNORMAL HIGH (ref 1.005–1.030)
Urobilinogen, UA: 0.2 mg/dL (ref 0.0–1.0)

## 2012-11-19 ENCOUNTER — Other Ambulatory Visit: Payer: Self-pay | Admitting: Women's Health

## 2012-12-18 ENCOUNTER — Telehealth: Payer: Self-pay | Admitting: *Deleted

## 2012-12-18 NOTE — Telephone Encounter (Signed)
Pt was seen for annual on 11/14/12. She asked if you would fill her lisinopril 10/12.5 mg 1 po daily for hypertension. She normally has PCP fill this but she can't get in to see her and pt has been without medication for all week. She also asked if you would be willing to continue to fill this for her? I told her that this is a GYN office and I wasn't sure if you would do this and it may be wise to start looking for another PCP. Please advise the above.

## 2012-12-18 NOTE — Telephone Encounter (Signed)
I agree what you had explained to her that she needs to contact her primary physician's nurse so that they can get the message to him and he can call in her prescription refill. She does not need to talk directly with him.

## 2012-12-18 NOTE — Telephone Encounter (Signed)
Pt informed with the below note. 

## 2013-01-26 ENCOUNTER — Other Ambulatory Visit: Payer: Self-pay

## 2013-01-26 DIAGNOSIS — Z1231 Encounter for screening mammogram for malignant neoplasm of breast: Secondary | ICD-10-CM

## 2013-02-17 ENCOUNTER — Other Ambulatory Visit: Payer: Self-pay | Admitting: Gynecology

## 2013-02-18 NOTE — Telephone Encounter (Signed)
rx called in [facility] 

## 2013-02-27 ENCOUNTER — Ambulatory Visit
Admission: RE | Admit: 2013-02-27 | Discharge: 2013-02-27 | Disposition: A | Discharge status: Home / Self Care | Payer: Managed Care, Other (non HMO) | Source: Ambulatory Visit

## 2013-02-27 DIAGNOSIS — Z1231 Encounter for screening mammogram for malignant neoplasm of breast: Secondary | ICD-10-CM

## 2013-07-14 ENCOUNTER — Other Ambulatory Visit: Payer: Self-pay | Admitting: Gynecology

## 2013-07-14 NOTE — Telephone Encounter (Signed)
Called into pharmacy

## 2013-09-08 ENCOUNTER — Ambulatory Visit (INDEPENDENT_AMBULATORY_CARE_PROVIDER_SITE_OTHER): Payer: Managed Care, Other (non HMO) | Admitting: Gynecology

## 2013-09-08 ENCOUNTER — Telehealth: Payer: Self-pay | Admitting: *Deleted

## 2013-09-08 ENCOUNTER — Encounter: Payer: Self-pay | Admitting: Gynecology

## 2013-09-08 VITALS — BP 128/82

## 2013-09-08 DIAGNOSIS — N644 Mastodynia: Secondary | ICD-10-CM

## 2013-09-08 DIAGNOSIS — T148XXA Other injury of unspecified body region, initial encounter: Secondary | ICD-10-CM

## 2013-09-08 MED ORDER — CYCLOBENZAPRINE HCL 5 MG PO TABS: ORAL_TABLET | ORAL | Status: DC

## 2013-09-08 MED ORDER — IBUPROFEN 800 MG PO TABS: 800.0000 mg | ORAL_TABLET | Freq: Three times a day (TID) | ORAL | Status: DC | PRN

## 2013-09-08 MED ORDER — PREDNISONE (PAK) 10 MG PO TABS: ORAL_TABLET | Freq: Every day | ORAL | Status: DC

## 2013-09-08 NOTE — Telephone Encounter (Signed)
Orders placed, breast center will contact to schedule.

## 2013-09-08 NOTE — Progress Notes (Signed)
47 year old patient presented to the office daily for 2 weeks her left side of her breast has been sore. She denies any recent trauma or injury. She denies any galactorrhea. She denies feeling any masses. Patient had a normal mammogram in May of 2014. Grandmother and aunt with history of breast cancer. Patient is on no hormone replacement therapy and has a history of a hysterectomy in the past.  Exam: Both breasts were examined sitting supine position both breasts are symmetrical and apparent no skin discoloration or nipple inversion no palpable masses, no supraclavicular axillary lymphadenopathy. Right breasts nontender no masses. Left breast tender towards the tail of Spence and on movement of her arm.  Assessment/plan: Mastodynia of left breast especially the tail of Spence area. Patient will be sent for an ultrasound of that area. She will meanwhile be treated perhaps as a musculoskeletal strain and will be placed on prednisone (Sterapred Unipak) 10 mg for 6 days. She'll also be placed on Motrin 800 mg 3 times a day when necessary and also Flexeril 10 mg at bedtime. We'll wait for the results of the ultrasound to determine if any additional treatment is needed. Patient denies caffeine consumption.

## 2013-09-08 NOTE — Patient Instructions (Signed)
Breast Tenderness Breast tenderness is a common problem for women of all ages. Breast tenderness may cause mild discomfort to severe pain. It has a variety of causes. Your health care provider will find out the likely cause of your breast tenderness by examining your breasts, asking you about symptoms, and ordering some tests. Breast tenderness usually does not mean you have breast cancer. HOME CARE INSTRUCTIONS  Breast tenderness often can be handled at home. You can try:  Getting fitted for a new bra that provides more support, especially during exercise.  Wearing a more supportive bra or sports bra while sleeping when your breasts are very tender.  If you have a breast injury, apply ice to the area:  Put ice in a plastic bag.  Place a towel between your skin and the bag.  Leave the ice on for 20 minutes, 2 3 times a day.  If your breasts are too full of milk as a result of breastfeeding, try:  Expressing milk either by hand or with a breast pump.  Applying a warm compress to the breasts for relief.  Taking over-the-counter pain relievers, if approved by your health care provider.  Taking other medicines that your health care provider prescribes. These may include antibiotic medicines or birth control pills. Over the long term, your breast tenderness might be eased if you:  Cut down on caffeine.  Reduce the amount of fat in your diet. Keep a log of the days and times when your breasts are most tender. This will help you and your health care provider find the cause of the tenderness and how to relieve it. Also, learn how to do breast exams at home. This will help you notice if you have an unusual growth or lump that could cause tenderness. SEEK MEDICAL CARE IF:   Any part of your breast is hard, red, and hot to the touch. This could be a sign of infection.  Fluid is coming out of your nipples (and you are not breastfeeding). Especially watch for blood or pus.  You have a fever  as well as breast tenderness.  You have a new or painful lump in your breast that remains after your menstrual period ends.  You have tried to take care of the pain at home, but it has not gone away.  Your breast pain is getting worse, or the pain is making it hard to do the things you usually do during your day. Document Released: 09/27/2008 Document Revised: 06/17/2013 Document Reviewed: 05/14/2013 Exit[employer] Patient Information 2014 [employer].  

## 2013-09-08 NOTE — Telephone Encounter (Signed)
Message copied by Aura Camps on Tue Sep 08, 2013 11:52 AM ------      Message from: Ok Edwards      Created: Tue Sep 08, 2013 11:15 AM       Victorino Dike, please schedule ultrasound of left breast on this patient with painful left breast lateral aspect towards tail of Spence. Patient had normal mammogram in May of this year. ------

## 2013-09-14 NOTE — Telephone Encounter (Signed)
Appt. 09/17/13 7:20 am

## 2013-09-17 ENCOUNTER — Ambulatory Visit
Admission: RE | Admit: 2013-09-17 | Discharge: 2013-09-17 | Disposition: A | Discharge status: Home / Self Care | Payer: Managed Care, Other (non HMO) | Source: Ambulatory Visit | Attending: Gynecology | Admitting: Gynecology

## 2013-09-17 DIAGNOSIS — N644 Mastodynia: Secondary | ICD-10-CM

## 2014-02-22 ENCOUNTER — Other Ambulatory Visit: Payer: Self-pay

## 2014-02-22 DIAGNOSIS — Z1231 Encounter for screening mammogram for malignant neoplasm of breast: Secondary | ICD-10-CM

## 2014-03-15 ENCOUNTER — Encounter: Payer: Self-pay | Admitting: *Deleted

## 2014-03-15 ENCOUNTER — Ambulatory Visit
Admission: RE | Admit: 2014-03-15 | Discharge: 2014-03-15 | Disposition: A | Discharge status: Home / Self Care | Payer: BC Managed Care – PPO | Source: Ambulatory Visit

## 2014-03-15 ENCOUNTER — Encounter (INDEPENDENT_AMBULATORY_CARE_PROVIDER_SITE_OTHER): Payer: Self-pay

## 2014-03-15 ENCOUNTER — Ambulatory Visit (INDEPENDENT_AMBULATORY_CARE_PROVIDER_SITE_OTHER): Payer: BC Managed Care – PPO | Admitting: Gynecology

## 2014-03-15 ENCOUNTER — Encounter: Payer: Self-pay | Admitting: Gynecology

## 2014-03-15 VITALS — BP 118/76 | Ht 64.0 in | Wt 191.0 lb

## 2014-03-15 DIAGNOSIS — Z01419 Encounter for gynecological examination (general) (routine) without abnormal findings: Secondary | ICD-10-CM

## 2014-03-15 DIAGNOSIS — F419 Anxiety disorder, unspecified: Secondary | ICD-10-CM

## 2014-03-15 DIAGNOSIS — R1031 Right lower quadrant pain: Secondary | ICD-10-CM

## 2014-03-15 DIAGNOSIS — Z23 Encounter for immunization: Secondary | ICD-10-CM

## 2014-03-15 DIAGNOSIS — G8929 Other chronic pain: Secondary | ICD-10-CM

## 2014-03-15 DIAGNOSIS — Z1231 Encounter for screening mammogram for malignant neoplasm of breast: Secondary | ICD-10-CM

## 2014-03-15 DIAGNOSIS — F411 Generalized anxiety disorder: Secondary | ICD-10-CM

## 2014-03-15 MED ORDER — ALPRAZOLAM 0.25 MG PO TABS: 0.2500 mg | ORAL_TABLET | Freq: Every evening | ORAL | Status: DC | PRN

## 2014-03-15 NOTE — Progress Notes (Signed)
Madeline Hawkins Nov 29, 1965 [id number]   History:    48 y.o.  for annual gyn exam with complaint of right lower quadrant heaviness and fullness as well as occasional dyspareunia.  Review of her record indicated that last year she had a total abdominal hysterectomy with a left ovarian cystectomy and has done well. Patient is here had a colonoscopy whereby a benign polyp was removed. Patient one family member and uncle with history of colon cancer. Patient has been followed by Dr. Bryon Lions who is monitoring her hypertension and hyperlipidemia. She has not seen her PCP and ovary year.  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  Gynecologic History Patient's last menstrual period was 11/10/2011. Contraception: status post hysterectomy Last Pap: 2013. Results were: normal Last mammogram: 2015. Results were: normal  Obstetric History OB History  Gravida Para Term Preterm AB SAB TAB Ectopic Multiple Living  5 4 4  1  0  1  4    # Outcome Date GA Lbr Len/2nd Weight Sex Delivery Anes PTL Lv  5 ECT           4 TRM     F SVD  N Y  3 TRM     M SVD  N Y  2 TRM     F SVD  N Y  1 TRM     F SVD  N Y       ROS: A ROS was performed and pertinent positives and negatives are included in the history.  GENERAL: No fevers or chills. HEENT: No change in vision, no earache, sore throat or sinus congestion. NECK: No pain or stiffness. CARDIOVASCULAR: No chest pain or pressure. No palpitations. PULMONARY: No shortness of breath, cough or wheeze. GASTROINTESTINAL: No abdominal pain, nausea, vomiting or diarrhea, melena or bright red  blood per rectum. GENITOURINARY: No urinary frequency, urgency, hesitancy or dysuria. MUSCULOSKELETAL: No joint or muscle pain, no back pain, no recent trauma. DERMATOLOGIC: No rash, no itching, no lesions. ENDOCRINE: No polyuria, polydipsia, no heat or cold intolerance. No recent change in weight. HEMATOLOGICAL: No anemia or easy bruising or bleeding. NEUROLOGIC: No headache, seizures, numbness, tingling or weakness. PSYCHIATRIC: No depression, no loss of interest in normal activity or change in sleep pattern.     Exam: chaperone present  BP 118/76  Ht 5\' 4"  (1.626 m)  Wt 191 lb (86.637 kg)  BMI 32.77 kg/m2  LMP 11/10/2011  Body mass index is 32.77 kg/(m^2).  General appearance : Well developed well nourished female. No acute distress HEENT: Neck supple, trachea midline, no carotid bruits, no thyroidmegaly Lungs: Clear to auscultation, no rhonchi or wheezes, or rib retractions  Heart: Regular rate and rhythm, no murmurs or gallops Breast:Examined in sitting and supine position were symmetrical in appearance, no palpable masses or tenderness,  no skin retraction, no nipple inversion, no nipple discharge, no skin discoloration, no axillary or supraclavicular lymphadenopathy Abdomen: no palpable masses or tenderness, no rebound or guarding Extremities: no edema or skin discoloration or tenderness  Pelvic:  Bartholin, Urethra, Skene Glands: Within normal limits             Vagina:  No gross lesions or discharge  Cervix: Absent  Uterus absent  Adnexa  Without masses or tenderness  Anus and perineum  normal   Rectovaginal  normal sphincter tone without palpated masses or tenderness             Hemoccult colonoscopy this year had a long benign polyp     Assessment/Plan:  48 y.o. female for annual exam within and right lower quadrant discomfort and dyspareunia. We will schedule her for an ultrasound the next couple weeks. We discussed importance of monthly self breast examination. We also  discussed importance of calcium vitamin D and regular exercise for osteoporosis prevention. Pap smear not done today. Patient with past history of CIN-1 in 2008 the pathology report from hysterectomy demonstrated no evidence of dysplasia. Patient was reminded to schedule her appointment with her PCP and to go in a fasting state for fasting blood work.  Note: This dictation was prepared with  Dragon/digital dictation along withSmart phrase technology. Any transcriptional errors that result from this process are unintentional.   Terrance Mass MD, 12:08 PM 03/15/2014

## 2014-03-15 NOTE — Addendum Note (Signed)
Addended by: Nelva Nay on: 03/15/2014 12:20 PM   Modules accepted: Orders

## 2014-03-15 NOTE — Patient Instructions (Signed)
Tetanus, Diphtheria (Td) Vaccine What You Need to Know WHY GET VACCINATED? Tetanus  and diphtheria are very serious diseases. They are rare in the [place] today, but people who do become infected often have severe complications. Td vaccine is used to protect adolescents and adults from both of these diseases. Both tetanus and diphtheria are infections caused by bacteria. Diphtheria spreads from person to person through coughing or sneezing. Tetanus-causing bacteria enter the body through cuts, scratches, or wounds. TETANUS (Lockjaw) causes painful muscle tightening and stiffness, usually all over the body.  It can lead to tightening of muscles in the head and neck so you can't open your mouth, swallow, or sometimes even breathe. Tetanus kills about 1 out of every 5 people who are infected. DIPHTHERIA can cause a thick coating to form in the back of the throat.  It can lead to breathing problems, paralysis, heart failure, and death. Before vaccines, the [place] saw as many as 200,000 cases a year of diphtheria and hundreds of cases of tetanus. Since vaccination began, cases of both diseases have dropped by about 99%. TD VACCINE Td vaccine can protect adolescents and adults from tetanus and diphtheria. Td is usually given as a booster dose every 10 years but it can also be given earlier after a severe and dirty wound or burn. Your doctor can give you more information. Td may safely be given at the same time as other vaccines. SOME PEOPLE SHOULD NOT GET THIS VACCINE  If you ever had a life-threatening allergic reaction after a dose of any tetanus or diphtheria containing vaccine, OR if you have a severe allergy to any part of this vaccine, you should not get Td. Tell your doctor if you have any severe allergies.  Talk to your doctor if you:  have epilepsy or another nervous system problem,  had severe pain or swelling after any vaccine containing diphtheria or tetanus,  ever had  Guillain Barr Syndrome (GBS),  aren't feeling well on the day the shot is scheduled. RISKS OF A VACCINE REACTION With a vaccine, like any medicine, there is a chance of side effects. These are usually mild and go away on their own. Serious side effects are also possible, but are very rare. Most people who get Td vaccine do not have any problems with it. Mild Problems  following Td (Did not interfere with activities)  Pain where the shot was given (about 8 people in 10)  Redness or swelling where the shot was given (about 1 person in 3)  Mild fever (about 1 person in 15)  Headache or Tiredness (uncommon) Moderate Problems following Td (Interfered with activities, but did not require medical attention)  Fever over 102 F (38.9 C) (rare) Severe Problems  following Td (Unable to perform usual activities; required medical attention)  Swelling, severe pain, bleeding, or redness in the arm where the shot was given (rare). Problems that could happen after any vaccine:  Brief fainting spells can happen after any medical procedure, including vaccination. Sitting or lying down for about 15 minutes can help prevent fainting, and injuries caused by a fall. Tell your doctor if you feel dizzy, or have vision changes or ringing in the ears.  Severe shoulder pain and reduced range of motion in the arm where a shot was given can happen, very rarely, after a vaccination.  Severe allergic reactions from a vaccine are very rare, estimated at less than 1 in a million doses. If one were to occur, it would   usually be within a few minutes to a few hours after the vaccination. WHAT IF THERE IS A SERIOUS REACTION? What should I look for?  Look for anything that concerns you, such as signs of a severe allergic reaction, very high fever, or behavior changes. Signs of a severe allergic reaction can include hives, swelling of the face and throat, difficulty breathing, a fast heartbeat, dizziness, and  weakness. These would usually start a few minutes to a few hours after the vaccination. What should I do?  If you think it is a severe allergic reaction or other emergency that can't wait, call 911 or get the person to the nearest hospital. Otherwise, call your doctor.  Afterward, the reaction should be reported to the Vaccine Adverse Event Reporting System (VAERS). Your doctor might file this report, or, you can do it yourself through the VAERS website or by calling 1-800-822-7967. VAERS is only for reporting reactions. They do not give medical advice. THE NATIONAL VACCINE INJURY COMPENSATION PROGRAM The National Vaccine Injury Compensation Program (VICP) is a federal program that was created to compensate people who may have been injured by certain vaccines. Persons who believe they may have been injured by a vaccine can learn about the program and about filing a claim by calling 1-800-338-2382 or visiting the VICP website. HOW CAN I LEARN MORE?  Ask your doctor.  Contact your local or state health department.  Contact the Centers for Disease Control and Prevention (CDC):  Call 1-800-232-4636 (1-800-CDC-INFO)  Visit CDC's vaccines website CDC Td Vaccine Interim VIS (12/02/12) Document Released: 08/12/2006 Document Revised: 02/09/2013 Document Reviewed: 02/04/2013 Exit[employer] Patient Information 2014 [employer].  

## 2014-03-15 NOTE — Progress Notes (Signed)
Patient ID: Madeline Hawkins, female   DOB: [birth year], 48 y.o.   MRN: [id number] Pt xanax 0.25 mg tablet called into pharmacy #30 with 1 refill

## 2014-03-25 ENCOUNTER — Telehealth: Payer: Self-pay | Admitting: *Deleted

## 2014-03-25 MED ORDER — IBUPROFEN 800 MG PO TABS: 800.0000 mg | ORAL_TABLET | Freq: Three times a day (TID) | ORAL | Status: DC | PRN

## 2014-03-25 NOTE — Telephone Encounter (Signed)
(  JF patient) pt calling c/o right side throbbing lower quad pain, has ultrasound scheduled on 04/02/14. Saw JF on 03/15/14 pt is requesting something to help with discomfort. Please advise

## 2014-03-25 NOTE — Telephone Encounter (Signed)
rx sent, left on pt voicemail this has been done. 

## 2014-03-25 NOTE — Telephone Encounter (Signed)
Recommend prescription strength ibuprofen 800 mg Q8 hours #30 refill x1

## 2014-04-02 ENCOUNTER — Ambulatory Visit (INDEPENDENT_AMBULATORY_CARE_PROVIDER_SITE_OTHER): Payer: BC Managed Care – PPO | Admitting: Gynecology

## 2014-04-02 ENCOUNTER — Encounter: Payer: Self-pay | Admitting: Gynecology

## 2014-04-02 ENCOUNTER — Ambulatory Visit (INDEPENDENT_AMBULATORY_CARE_PROVIDER_SITE_OTHER): Payer: BC Managed Care – PPO

## 2014-04-02 VITALS — BP 128/76

## 2014-04-02 DIAGNOSIS — N83201 Unspecified ovarian cyst, right side: Secondary | ICD-10-CM | POA: Insufficient documentation

## 2014-04-02 DIAGNOSIS — N949 Unspecified condition associated with female genital organs and menstrual cycle: Secondary | ICD-10-CM

## 2014-04-02 DIAGNOSIS — G8929 Other chronic pain: Secondary | ICD-10-CM

## 2014-04-02 DIAGNOSIS — R1031 Right lower quadrant pain: Secondary | ICD-10-CM

## 2014-04-02 DIAGNOSIS — N83209 Unspecified ovarian cyst, unspecified side: Secondary | ICD-10-CM

## 2014-04-02 MED ORDER — TRAMADOL HCL 50 MG PO TABS: 50.0000 mg | ORAL_TABLET | Freq: Four times a day (QID) | ORAL | Status: DC | PRN

## 2014-04-02 MED ORDER — MEDROXYPROGESTERONE ACETATE 150 MG/ML IM SUSP
150.0000 mg | Freq: Once | INTRAMUSCULAR | Status: AC
  Administered 2014-04-02: 150 mg via INTRAMUSCULAR

## 2014-04-02 NOTE — Progress Notes (Signed)
   Patient presented to the office today to discuss her ultrasound. Patient was seen in the office on May 18 for her annual exam. Patient with prior history of left salpingectomy secondary to ectopic pregnancy and right tubal ligation as well as history of total abdominal hysterectomy with left ovarian cystectomy in 2014. At this recent office visit she had been complaining of right lower quadrant heaviness and fullness as well as occasional dyspareunia.  Ultrasound today: Absent uterus. Vaginal cuff normal. A left ovarian corpus luteum cyst measuring 23 x 21 mm was noted along with a right ovarian ankle free thinwall avascular cyst with low level echoes measuring 36 x 44 mm. Small amount of free fluid was seen the cul-de-sac.  Assessment/plan patient's symptoms may be attributed to right ovarian cyst which has benign features nevertheless we will do a CA 125 for which his limitations were discussed with the patient. She will receive Depo-Provera 150 mg IM and return to the office in 3 months for followup ultrasound. Literature and information on ovarian cyst was provided as well.

## 2014-04-02 NOTE — Patient Instructions (Signed)
CA-125 Tumor Marker CA 125 is a tumor marker that is used to help monitor the course of ovarian or endometrial cancer. PREPARATION FOR TEST No preparation is necessary. NORMAL FINDINGS Adults: 0-35 units/mL (0-35 kilounits)/L Ranges for normal findings may vary among different laboratories and hospitals. You should always check with your doctor after having lab work or other tests done to discuss the meaning of your test results and whether your values are considered within normal limits. MEANING OF TEST  Your caregiver will go over the test results with you and discuss the importance and meaning of your results, as well as treatment options and the need for additional tests if necessary. OBTAINING THE TEST RESULTS It is your responsibility to obtain your test results. Ask the lab or department performing the test when and how you will get your results. Document Released: 11/06/2004 Document Revised: 01/07/2012 Document Reviewed: 09/22/2008 [facility] Patient Information 2014 [place], [place]. Ovarian Cyst An ovarian cyst is a fluid-filled sac that forms on an ovary. The ovaries are small organs that produce eggs in women. Various types of cysts can form on the ovaries. Most are not cancerous. Many do not cause problems, and they often go away on their own. Some may cause symptoms and require treatment. Common types of ovarian cysts include:  Functional cysts These cysts may occur every month during the menstrual cycle. This is normal. The cysts usually go away with the next menstrual cycle if the woman does not get pregnant. Usually, there are no symptoms with a functional cyst.  Endometrioma cysts These cysts form from the tissue that lines the uterus. They are also called "chocolate cysts" because they become filled with blood that turns brown. This type of cyst can cause pain in the lower abdomen during intercourse and with your menstrual period.  Cystadenoma cysts This type develops from the  cells on the outside of the ovary. These cysts can get very big and cause lower abdomen pain and pain with intercourse. This type of cyst can twist on itself, cut off its blood supply, and cause severe pain. It can also easily rupture and cause a lot of pain.  Dermoid cysts This type of cyst is sometimes found in both ovaries. These cysts may contain different kinds of body tissue, such as skin, teeth, hair, or cartilage. They usually do not cause symptoms unless they get very big.  Theca lutein cysts These cysts occur when too much of a certain hormone (human chorionic gonadotropin) is produced and overstimulates the ovaries to produce an egg. This is most common after procedures used to assist with the conception of a baby (in vitro fertilization). CAUSES   Fertility drugs can cause a condition in which multiple large cysts are formed on the ovaries. This is called ovarian hyperstimulation syndrome.  A condition called polycystic ovary syndrome can cause hormonal imbalances that can lead to nonfunctional ovarian cysts. SIGNS AND SYMPTOMS  Many ovarian cysts do not cause symptoms. If symptoms are present, they may include:  Pelvic pain or pressure.  Pain in the lower abdomen.  Pain during sexual intercourse.  Increasing girth (swelling) of the abdomen.  Abnormal menstrual periods.  Increasing pain with menstrual periods.  Stopping having menstrual periods without being pregnant. DIAGNOSIS  These cysts are commonly found during a routine or annual pelvic exam. Tests may be ordered to find out more about the cyst. These tests may include:  Ultrasound.  X-ray of the pelvis.  CT scan.  MRI.  Blood tests.  TREATMENT  Many ovarian cysts go away on their own without treatment. Your health care provider may want to check your cyst regularly for 2 3 months to see if it changes. For women in menopause, it is particularly important to monitor a cyst closely because of the higher rate of  ovarian cancer in menopausal women. When treatment is needed, it may include any of the following:  A procedure to drain the cyst (aspiration). This may be done using a long needle and ultrasound. It can also be done through a laparoscopic procedure. This involves using a thin, lighted tube with a tiny camera on the end (laparoscope) inserted through a small incision.  Surgery to remove the whole cyst. This may be done using laparoscopic surgery or an open surgery involving a larger incision in the lower abdomen.  Hormone treatment or birth control pills. These methods are sometimes used to help dissolve a cyst. HOME CARE INSTRUCTIONS   Only take over-the-counter or prescription medicines as directed by your health care provider.  Follow up with your health care provider as directed.  Get regular pelvic exams and Pap tests. SEEK MEDICAL CARE IF:   Your periods are late, irregular, or painful, or they stop.  Your pelvic pain or abdominal pain does not go away.  Your abdomen becomes larger or swollen.  You have pressure on your bladder or trouble emptying your bladder completely.  You have pain during sexual intercourse.  You have feelings of fullness, pressure, or discomfort in your stomach.  You lose weight for no apparent reason.  You feel generally ill.  You become constipated.  You lose your appetite.  You develop acne.  You have an increase in body and facial hair.  You are gaining weight, without changing your exercise and eating habits.  You think you are pregnant. SEEK IMMEDIATE MEDICAL CARE IF:   You have increasing abdominal pain.  You feel sick to your stomach (nauseous), and you throw up (vomit).  You develop a fever that comes on suddenly.  You have abdominal pain during a bowel movement.  Your menstrual periods become heavier than usual. Document Released: 10/15/2005 Document Revised: 08/05/2013 Document Reviewed: 06/22/2013 [facility] Patient  Information 2014 [place].

## 2014-04-03 LAB — CA 125: CA 125: 7.4 U/mL (ref 0.0–30.2)

## 2014-04-05 ENCOUNTER — Telehealth: Payer: Self-pay

## 2014-04-05 NOTE — Telephone Encounter (Signed)
Message copied by Ramond Craver on Mon Apr 05, 2014 10:58 AM ------      Message from: Terrance Mass      Created: Mon Apr 05, 2014 10:07 AM       Patient had a CA 125 was normal ------

## 2014-04-05 NOTE — Telephone Encounter (Signed)
Patient advised.  She will try taking two of her Tramadol q 6 hours. She declines Toradol injection at this time.  She was advised to schedule office visit this week regarding wanting work restrictions or to be out of work.  Transferred to appts to schedule.

## 2014-04-05 NOTE — Telephone Encounter (Signed)
Narcotics will cause her to be sleepy at work this is why I prescribed her the Ultram. We can switch her to Toradol for which she can come and receive a 60 mg shot today and then 1 every 6 hours not to exceed 5 days. She can also increase her on Trental 100 mg every 6 hours as needed for pain.

## 2014-04-05 NOTE — Telephone Encounter (Signed)
It was a dragon error. Tramadol (ULTRAM). She will need to come by office to be seen this week to exam and discuss restriction from work so tyhen I can write a note.

## 2014-04-05 NOTE — Telephone Encounter (Signed)
Patient said she is taking the Tramadol you gave her for pain. Still finding it impossible to work. She is home today. She said it is too hard to do her job with the pain.  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 She stands and does a lot of heavy lifting.

## 2014-04-05 NOTE — Telephone Encounter (Signed)
Dr. Moshe Salisbury- Patient is finding it impossible to work because of her pain not her pain medication.  She is out of work today.  I feel like she is wanting you to take her out of work or place restrictions on her standing, lifting,etc.  I will tell her the info below regarding medication but need to know your feelings on supporting her absence from work?  Also, in note below you wrote "She can also increase her on Trental 100 mg every 6 hours as needed for pain".  Please clarify "on Trental 100mg ". Thanks

## 2014-04-08 ENCOUNTER — Ambulatory Visit (INDEPENDENT_AMBULATORY_CARE_PROVIDER_SITE_OTHER): Payer: BC Managed Care – PPO | Admitting: Gynecology

## 2014-04-08 ENCOUNTER — Encounter: Payer: Self-pay | Admitting: Gynecology

## 2014-04-08 VITALS — BP 124/70

## 2014-04-08 DIAGNOSIS — N949 Unspecified condition associated with female genital organs and menstrual cycle: Secondary | ICD-10-CM

## 2014-04-08 DIAGNOSIS — N83209 Unspecified ovarian cyst, unspecified side: Secondary | ICD-10-CM

## 2014-04-08 DIAGNOSIS — R102 Pelvic and perineal pain: Secondary | ICD-10-CM

## 2014-04-08 DIAGNOSIS — N83201 Unspecified ovarian cyst, right side: Secondary | ICD-10-CM

## 2014-04-08 LAB — CBC WITH DIFFERENTIAL/PLATELET
BASOS ABS: 0.1 10*3/uL (ref 0.0–0.1)
BASOS PCT: 1 % (ref 0–1)
EOS ABS: 0.1 10*3/uL (ref 0.0–0.7)
Eosinophils Relative: 1 % (ref 0–5)
HCT: 38.1 % (ref 36.0–46.0)
Hemoglobin: 12.6 g/dL (ref 12.0–15.0)
Lymphocytes Relative: 44 % (ref 12–46)
Lymphs Abs: 2.2 10*3/uL (ref 0.7–4.0)
MCH: 26.7 pg (ref 26.0–34.0)
MCHC: 33.1 g/dL (ref 30.0–36.0)
MCV: 80.7 fL (ref 78.0–100.0)
Monocytes Absolute: 0.5 10*3/uL (ref 0.1–1.0)
Monocytes Relative: 10 % (ref 3–12)
NEUTROS PCT: 44 % (ref 43–77)
Neutro Abs: 2.2 10*3/uL (ref 1.7–7.7)
Platelets: 245 10*3/uL (ref 150–400)
RBC: 4.72 MIL/uL (ref 3.87–5.11)
RDW: 15.4 % (ref 11.5–15.5)
WBC: 5.1 10*3/uL (ref 4.0–10.5)

## 2014-04-08 NOTE — Progress Notes (Signed)
   The patient presented to the office today with a complaint of right lower abdominal discomfort. Patient with known right ovarian cyst. Patient was seen in the office on 04/02/2014 for followup discussion on ultrasound. At time of her annual exam there was a questionable fullness and tenderness in the right adnexa she was having occasional dyspareunia. The ultrasound demonstrated the following:  Ultrasound today:  Absent uterus. Vaginal cuff normal. A left ovarian corpus luteum cyst measuring 23 x 21 mm was noted along with a right ovarian ankle free thinwall avascular cyst with low level echoes measuring 36 x 44 mm. Small amount of free fluid was seen the cul-de-sac.  Patient received Depo-Provera 150 mg IM and was to return in 3 months for followup ultrasound. Her CEA 125 was normal.  Exam: Abdomen: Soft nontender no rebound or guarding Pelvic: Bartholin urethra Skene was within normal limits Vagina: No lesions or discharge Cervix no lesion or discharge Uterus anteverted slight tenderness noted in the right adnexa but no rebound or guarding Left adnexa no palpable mass or tenderness Rectal exam deferred  Assessment/plan: Small right ovarian cyst causing the patient discomfort see note above. Patient will take Ultram 50-100 mg every 6 hours when necessary. We will check her CBC and urinalysis today. A note was provided for her to stay out of work for one more week. She will return back to the office in 3 months for followup ultrasound. She was to return to work in one week with light duty for the first 2 weeks back was without was described in the note provided today.

## 2014-04-09 ENCOUNTER — Telehealth: Payer: Self-pay

## 2014-04-09 LAB — URINALYSIS W MICROSCOPIC + REFLEX CULTURE
Bacteria, UA: NONE SEEN
Bilirubin Urine: NEGATIVE
Casts: NONE SEEN
Crystals: NONE SEEN
GLUCOSE, UA: NEGATIVE mg/dL
HGB URINE DIPSTICK: NEGATIVE
Ketones, ur: NEGATIVE mg/dL
Leukocytes, UA: NEGATIVE
Nitrite: NEGATIVE
PROTEIN: NEGATIVE mg/dL
SQUAMOUS EPITHELIAL / LPF: NONE SEEN
Specific Gravity, Urine: 1.024 (ref 1.005–1.030)
UROBILINOGEN UA: 0.2 mg/dL (ref 0.0–1.0)
pH: 5.5 (ref 5.0–8.0)

## 2014-04-09 NOTE — Telephone Encounter (Signed)
Letter provided. Faxed to employer at patient request.

## 2014-04-09 NOTE — Telephone Encounter (Signed)
Yes this would be fine

## 2014-04-09 NOTE — Telephone Encounter (Signed)
Patient called stating she would like release to return to work on Monday as her job has found her some light duty.  Ok?

## 2014-04-12 ENCOUNTER — Telehealth: Payer: Self-pay

## 2014-04-12 NOTE — Telephone Encounter (Signed)
Patient called stating that her job does not have light duty work enough for two weeks. She said they told her she will need to do short term disability as they cannot accomodate her light duty request.  I called patient back and left message that she just needs to get those forms to me, $25 charge for form to be paid prior to completing them, and let me know how to get them back to her or where to send them.

## 2014-06-02 ENCOUNTER — Other Ambulatory Visit: Payer: Self-pay | Admitting: Gynecology

## 2014-06-02 NOTE — Telephone Encounter (Signed)
Called into pharmacy

## 2014-06-13 ENCOUNTER — Other Ambulatory Visit: Payer: Self-pay | Admitting: Gynecology

## 2014-08-13 ENCOUNTER — Other Ambulatory Visit: Payer: Self-pay

## 2014-08-30 ENCOUNTER — Encounter: Payer: Self-pay | Admitting: Gynecology

## 2015-01-20 ENCOUNTER — Emergency Department (HOSPITAL_BASED_OUTPATIENT_CLINIC_OR_DEPARTMENT_OTHER)
Admission: EM | Admit: 2015-01-20 | Discharge: 2015-01-20 | Disposition: A | Discharge status: Home / Self Care | Payer: BLUE CROSS/BLUE SHIELD | Attending: Emergency Medicine | Admitting: Emergency Medicine

## 2015-01-20 ENCOUNTER — Encounter (HOSPITAL_BASED_OUTPATIENT_CLINIC_OR_DEPARTMENT_OTHER): Payer: Self-pay

## 2015-01-20 DIAGNOSIS — K529 Noninfective gastroenteritis and colitis, unspecified: Secondary | ICD-10-CM | POA: Diagnosis not present

## 2015-01-20 DIAGNOSIS — Z8659 Personal history of other mental and behavioral disorders: Secondary | ICD-10-CM | POA: Insufficient documentation

## 2015-01-20 DIAGNOSIS — Z87448 Personal history of other diseases of urinary system: Secondary | ICD-10-CM | POA: Diagnosis not present

## 2015-01-20 DIAGNOSIS — J029 Acute pharyngitis, unspecified: Secondary | ICD-10-CM

## 2015-01-20 DIAGNOSIS — I10 Essential (primary) hypertension: Secondary | ICD-10-CM | POA: Insufficient documentation

## 2015-01-20 LAB — CBC WITH DIFFERENTIAL/PLATELET
BASOS ABS: 0 10*3/uL (ref 0.0–0.1)
Basophils Relative: 1 % (ref 0–1)
Eosinophils Absolute: 0.1 10*3/uL (ref 0.0–0.7)
Eosinophils Relative: 1 % (ref 0–5)
HCT: 36.2 % (ref 36.0–46.0)
HEMOGLOBIN: 11.8 g/dL — AB (ref 12.0–15.0)
LYMPHS PCT: 45 % (ref 12–46)
Lymphs Abs: 1.7 10*3/uL (ref 0.7–4.0)
MCH: 27.2 pg (ref 26.0–34.0)
MCHC: 32.6 g/dL (ref 30.0–36.0)
MCV: 83.4 fL (ref 78.0–100.0)
Monocytes Absolute: 0.5 10*3/uL (ref 0.1–1.0)
Monocytes Relative: 14 % — ABNORMAL HIGH (ref 3–12)
Neutro Abs: 1.5 10*3/uL — ABNORMAL LOW (ref 1.7–7.7)
Neutrophils Relative %: 39 % — ABNORMAL LOW (ref 43–77)
Platelets: 196 10*3/uL (ref 150–400)
RBC: 4.34 MIL/uL (ref 3.87–5.11)
RDW: 14.6 % (ref 11.5–15.5)
WBC: 3.8 10*3/uL — AB (ref 4.0–10.5)

## 2015-01-20 LAB — BASIC METABOLIC PANEL
ANION GAP: 7 (ref 5–15)
BUN: 15 mg/dL (ref 6–23)
CHLORIDE: 106 mmol/L (ref 96–112)
CO2: 25 mmol/L (ref 19–32)
Calcium: 9 mg/dL (ref 8.4–10.5)
Creatinine, Ser: 0.77 mg/dL (ref 0.50–1.10)
GFR calc non Af Amer: >90 mL/min (ref >90)
Glucose, Bld: 83 mg/dL (ref 70–99)
Potassium: 3.6 mmol/L (ref 3.5–5.1)
Sodium: 138 mmol/L (ref 135–145)

## 2015-01-20 LAB — RAPID STREP SCREEN (MED CTR MEBANE ONLY): STREPTOCOCCUS, GROUP A SCREEN (DIRECT): NEGATIVE

## 2015-01-20 MED ORDER — SODIUM CHLORIDE 0.9 % IV BOLUS (SEPSIS)
1000.0000 mL | Freq: Once | INTRAVENOUS | Status: AC
  Administered 2015-01-20: 1000 mL via INTRAVENOUS

## 2015-01-20 MED ORDER — ONDANSETRON HCL 4 MG/2ML IJ SOLN
4.0000 mg | Freq: Once | INTRAMUSCULAR | Status: AC
  Administered 2015-01-20: 4 mg via INTRAVENOUS
  Filled 2015-01-20: qty 2

## 2015-01-20 MED ORDER — ONDANSETRON 8 MG PO TBDP: ORAL_TABLET | ORAL | Status: DC

## 2015-01-20 MED ORDER — KETOROLAC TROMETHAMINE 30 MG/ML IJ SOLN
30.0000 mg | Freq: Once | INTRAMUSCULAR | Status: AC
  Administered 2015-01-20: 30 mg via INTRAVENOUS
  Filled 2015-01-20: qty 1

## 2015-01-20 NOTE — ED Provider Notes (Signed)
CSN: [id number]     Arrival date & time 01/20/15  0718 History   First MD Initiated Contact with Patient 01/20/15 [phone]     Chief Complaint  Patient presents with  . Sore Throat     (Consider location/radiation/quality/duration/timing/severity/associated sxs/prior Treatment) HPI Comments: Patient is a 49 year old female who presents with a 2 day history of nausea, vomiting, diarrhea. She also reports feeling chilled, body aches, sore throat, and abdominal cramping. She says that several of her coworkers are ill in a similar fashion.  Patient is a 48 y.o. female presenting with vomiting. The history is provided by the patient.  Emesis Severity:  Moderate Duration:  2 days Timing:  Constant Progression:  Worsening Chronicity:  New Recent urination:  Normal Relieved by:  Nothing Worsened by:  Nothing tried Ineffective treatments:  None tried Associated symptoms: abdominal pain, chills, fever and myalgias     Past Medical History  Diagnosis Date  . CIN I (cervical intraepithelial neoplasia I) 1989    cryo...also in 2008 .. had cryo   . Hypertension   . Anxiety    Past Surgical History  Procedure Laterality Date  . Salpingectomy      left for ectopic pregnancy  . Gynecologic cryosurgery  07/07/2007  . Tubal ligation      right tubal ligation  . Laparoscopy  11/22/2011    Procedure: LAPAROSCOPY DIAGNOSTIC;  Surgeon: Terrance Mass, MD;  Location: [facility];  Service: Gynecology;  Laterality: N/A;  . Abdominal hysterectomy  11/22/2011    Procedure: HYSTERECTOMY ABDOMINAL;  Surgeon: Terrance Mass, MD;  Location: [facility];  Service: Gynecology;  Laterality: N/A;  . Ovarian cyst removal  11/22/2011    Procedure: OVARIAN CYSTECTOMY;  Surgeon: Terrance Mass, MD;  Location: [facility];  Service: Gynecology;  Laterality: Left;  . Oophorectomy      LSO  . Colon polyps removed     Family History  Problem Relation Age of Onset  . Diabetes Brother   . Ovarian cancer Mother   . Ovarian  cancer Paternal Aunt   . Heart disease Maternal Grandmother   . Breast cancer Maternal Grandmother     Age 75's  . Breast cancer Maternal Aunt     Age 59's   History  Substance Use Topics  . Smoking status: Never Smoker   . Smokeless tobacco: Never Used  . Alcohol Use: No   OB History    Gravida Para Term Preterm AB TAB SAB Ectopic Multiple Living   5 4 4  1   0 1  4     Review of Systems  Constitutional: Positive for chills.  Gastrointestinal: Positive for vomiting and abdominal pain.  Musculoskeletal: Positive for myalgias.  All other systems reviewed and are negative.     Allergies  Review of patient's allergies indicates no known allergies.  Home Medications   Prior to Admission medications   Medication Sig Start Date End Date Taking? Authorizing Provider  lisinopril (PRINIVIL,ZESTRIL) 2.5 MG tablet Take 2.5 mg by mouth daily.    Historical Provider, MD   BP 146/81 mmHg  Pulse 81  Temp(Src) 97.9 F (36.6 C) (Oral)  Resp 16  Ht 5\' 3"  (1.6 m)  Wt 196 lb (88.905 kg)  BMI 34.73 kg/m2  SpO2 100%  LMP 11/10/2011 Physical Exam  Constitutional: She is oriented to person, place, and time. She appears well-developed and well-nourished. No distress.  HENT:  Head: Normocephalic and atraumatic.  Mouth/Throat: Oropharynx is clear and moist.  Neck:  Normal range of motion. Neck supple.  Cardiovascular: Normal rate and regular rhythm.  Exam reveals no gallop and no friction rub.   No murmur heard. Pulmonary/Chest: Effort normal and breath sounds normal. No respiratory distress. She has no wheezes.  Abdominal: Soft. Bowel sounds are normal. She exhibits no distension. There is no tenderness.  Musculoskeletal: Normal range of motion.  Neurological: She is alert and oriented to person, place, and time.  Skin: Skin is warm and dry. She is not diaphoretic.  Nursing note and vitals reviewed.   ED Course  Procedures (including critical care time) Labs Review Labs Reviewed  - No data to display  Imaging Review No results found.   EKG Interpretation None      MDM   Final diagnoses:  None    Patient's presentation, exam, and workup are consistent with a viral illness. Is feeling better with IV fluids and medications. She will be discharged to home, to follow-up as needed for any problems.    Veryl Speak, MD 01/20/15 [phone]

## 2015-01-20 NOTE — Discharge Instructions (Signed)
Zofran as prescribed as needed for nausea.  Ibuprofen 600 mg every 6 hours as needed for sore throat.  Return to the emergency department if your symptoms significantly worsen or change.   Viral Gastroenteritis Viral gastroenteritis is also known as stomach flu. This condition affects the stomach and intestinal tract. It can cause sudden diarrhea and vomiting. The illness typically lasts 3 to 8 days. Most people develop an immune response that eventually gets rid of the virus. While this natural response develops, the virus can make you quite ill. CAUSES  Many different viruses can cause gastroenteritis, such as rotavirus or noroviruses. You can catch one of these viruses by consuming contaminated food or water. You may also catch a virus by sharing utensils or other personal items with an infected person or by touching a contaminated surface. SYMPTOMS  The most common symptoms are diarrhea and vomiting. These problems can cause a severe loss of body fluids (dehydration) and a body salt (electrolyte) imbalance. Other symptoms may include:  Fever.  Headache.  Fatigue.  Abdominal pain. DIAGNOSIS  Your caregiver can usually diagnose viral gastroenteritis based on your symptoms and a physical exam. A stool sample may also be taken to test for the presence of viruses or other infections. TREATMENT  This illness typically goes away on its own. Treatments are aimed at rehydration. The most serious cases of viral gastroenteritis involve vomiting so severely that you are not able to keep fluids down. In these cases, fluids must be given through an intravenous line (IV). HOME CARE INSTRUCTIONS   Drink enough fluids to keep your urine clear or pale yellow. Drink small amounts of fluids frequently and increase the amounts as tolerated.  Ask your caregiver for specific rehydration instructions.  Avoid:  Foods high in sugar.  Alcohol.  Carbonated drinks.  Tobacco.  Juice.  Caffeine  drinks.  Extremely hot or cold fluids.  Fatty, greasy foods.  Too much intake of anything at one time.  Dairy products until 24 to 48 hours after diarrhea stops.  You may consume probiotics. Probiotics are active cultures of beneficial bacteria. They may lessen the amount and number of diarrheal stools in adults. Probiotics can be found in yogurt with active cultures and in supplements.  Wash your hands well to avoid spreading the virus.  Only take over-the-counter or prescription medicines for pain, discomfort, or fever as directed by your caregiver. Do not give aspirin to children. Antidiarrheal medicines are not recommended.  Ask your caregiver if you should continue to take your regular prescribed and over-the-counter medicines.  Keep all follow-up appointments as directed by your caregiver. SEEK IMMEDIATE MEDICAL CARE IF:   You are unable to keep fluids down.  You do not urinate at least once every 6 to 8 hours.  You develop shortness of breath.  You notice blood in your stool or vomit. This may look like coffee grounds.  You have abdominal pain that increases or is concentrated in one small area (localized).  You have persistent vomiting or diarrhea.  You have a fever.  The patient is a child younger than 3 months, and he or she has a fever.  The patient is a child older than 3 months, and he or she has a fever and persistent symptoms.  The patient is a child older than 3 months, and he or she has a fever and symptoms suddenly get worse.  The patient is a baby, and he or she has no tears when crying. MAKE SURE YOU:  Understand these instructions.  Will watch your condition.  Will get help right away if you are not doing well or get worse. Document Released: 10/15/2005 Document Revised: 01/07/2012 Document Reviewed: 08/01/2011 [facility] Patient Information 2015 [place], [place]. This information is not intended to replace advice given to you by your health care  provider. Make sure you discuss any questions you have with your health care provider.  Sore Throat A sore throat is pain, burning, irritation, or scratchiness of the throat. There is often pain or tenderness when swallowing or talking. A sore throat may be accompanied by other symptoms, such as coughing, sneezing, fever, and swollen neck glands. A sore throat is often the first sign of another sickness, such as a cold, flu, strep throat, or mononucleosis (commonly known as mono). Most sore throats go away without medical treatment. CAUSES  The most common causes of a sore throat include:  A viral infection, such as a cold, flu, or mono.  A bacterial infection, such as strep throat, tonsillitis, or whooping cough.  Seasonal allergies.  Dryness in the air.  Irritants, such as smoke or pollution.  Gastroesophageal reflux disease (GERD). HOME CARE INSTRUCTIONS   Only take over-the-counter medicines as directed by your caregiver.  Drink enough fluids to keep your urine clear or pale yellow.  Rest as needed.  Try using throat sprays, lozenges, or sucking on hard candy to ease any pain (if older than 4 years or as directed).  Sip warm liquids, such as broth, herbal tea, or warm water with honey to relieve pain temporarily. You may also eat or drink cold or frozen liquids such as frozen ice pops.  Gargle with salt water (mix 1 tsp salt with 8 oz of water).  Do not smoke and avoid secondhand smoke.  Put a cool-mist humidifier in your bedroom at night to moisten the air. You can also turn on a hot shower and sit in the bathroom with the door closed for 5-10 minutes. SEEK IMMEDIATE MEDICAL CARE IF:  You have difficulty breathing.  You are unable to swallow fluids, soft foods, or your saliva.  You have increased swelling in the throat.  Your sore throat does not get better in 7 days.  You have nausea and vomiting.  You have a fever or persistent symptoms for more than 2-3  days.  You have a fever and your symptoms suddenly get worse. MAKE SURE YOU:   Understand these instructions.  Will watch your condition.  Will get help right away if you are not doing well or get worse. Document Released: 11/22/2004 Document Revised: 10/01/2012 Document Reviewed: 06/22/2012 [facility] Patient Information 2015 [place], [place]. This information is not intended to replace advice given to you by your health care provider. Make sure you discuss any questions you have with your health care provider.

## 2015-01-20 NOTE — ED Notes (Signed)
Sore throat that started yesterday and vomiting and diarrhea that started this am. + sick contacts at work.

## 2015-01-22 LAB — CULTURE, GROUP A STREP: Strep A Culture: NEGATIVE

## 2015-04-21 ENCOUNTER — Other Ambulatory Visit: Payer: Self-pay

## 2015-04-21 DIAGNOSIS — Z1231 Encounter for screening mammogram for malignant neoplasm of breast: Secondary | ICD-10-CM

## 2015-05-06 ENCOUNTER — Ambulatory Visit
Admission: RE | Admit: 2015-05-06 | Discharge: 2015-05-06 | Disposition: A | Discharge status: Home / Self Care | Payer: BLUE CROSS/BLUE SHIELD | Source: Ambulatory Visit

## 2015-05-06 DIAGNOSIS — Z1231 Encounter for screening mammogram for malignant neoplasm of breast: Secondary | ICD-10-CM

## 2015-05-23 ENCOUNTER — Encounter: Payer: Self-pay | Admitting: Gynecology

## 2015-05-23 ENCOUNTER — Other Ambulatory Visit: Payer: Self-pay | Admitting: Gynecology

## 2015-05-23 ENCOUNTER — Ambulatory Visit (INDEPENDENT_AMBULATORY_CARE_PROVIDER_SITE_OTHER): Payer: BLUE CROSS/BLUE SHIELD | Admitting: Gynecology

## 2015-05-23 DIAGNOSIS — R102 Pelvic and perineal pain unspecified side: Secondary | ICD-10-CM

## 2015-05-23 DIAGNOSIS — N8320 Unspecified ovarian cysts: Secondary | ICD-10-CM | POA: Diagnosis not present

## 2015-05-23 DIAGNOSIS — N83201 Unspecified ovarian cyst, right side: Secondary | ICD-10-CM

## 2015-05-23 LAB — CBC WITH DIFFERENTIAL/PLATELET
BASOS PCT: 1 % (ref 0–1)
Basophils Absolute: 0.1 10*3/uL (ref 0.0–0.1)
EOS PCT: 1 % (ref 0–5)
Eosinophils Absolute: 0.1 10*3/uL (ref 0.0–0.7)
HCT: 36.7 % (ref 36.0–46.0)
HEMOGLOBIN: 11.9 g/dL — AB (ref 12.0–15.0)
LYMPHS ABS: 2.3 10*3/uL (ref 0.7–4.0)
LYMPHS PCT: 46 % (ref 12–46)
MCH: 27.2 pg (ref 26.0–34.0)
MCHC: 32.4 g/dL (ref 30.0–36.0)
MCV: 84 fL (ref 78.0–100.0)
MONOS PCT: 9 % (ref 3–12)
MPV: 9.3 fL (ref 8.6–12.4)
Monocytes Absolute: 0.5 10*3/uL (ref 0.1–1.0)
Neutro Abs: 2.2 10*3/uL (ref 1.7–7.7)
Neutrophils Relative %: 43 % (ref 43–77)
Platelets: 210 10*3/uL (ref 150–400)
RBC: 4.37 MIL/uL (ref 3.87–5.11)
RDW: 15.3 % (ref 11.5–15.5)
WBC: 5 10*3/uL (ref 4.0–10.5)

## 2015-05-23 MED ORDER — MEDROXYPROGESTERONE ACETATE 150 MG/ML IM SUSP
150.0000 mg | Freq: Once | INTRAMUSCULAR | Status: AC
  Administered 2015-05-23: 150 mg via INTRAMUSCULAR

## 2015-05-23 MED ORDER — KETOROLAC TROMETHAMINE 10 MG PO TABS: 10.0000 mg | ORAL_TABLET | Freq: Four times a day (QID) | ORAL | Status: DC | PRN

## 2015-05-23 MED ORDER — KETOROLAC TROMETHAMINE 30 MG/ML IJ SOLN
60.0000 mg | Freq: Once | INTRAMUSCULAR | Status: AC
  Administered 2015-05-23: 60 mg via INTRAMUSCULAR

## 2015-05-23 MED ORDER — KETOROLAC TROMETHAMINE 30 MG/ML IJ SOLN: 60.0000 mg | Freq: Once | INTRAMUSCULAR | Status: DC

## 2015-05-23 NOTE — Progress Notes (Signed)
    Patient is a 49 year old who presented to the office stating the past 2 weeks has had increasing  Right lower abdominal pain. She denied any nausea, vomiting, fever, chills. She denies any GU or GI complaints. Review of her record indicated that back in  2013 she had a total abdominal hysterectomy for leiomyomatous uteri along with left ovarian cystectomy which was a hemorrhagic cyst. An ultrasound was done here in the office on 04/02/2014 demonstrated the following:  Absent uterus. Vaginal cuff normal. A left ovarian corpus luteum cyst measuring 23 x 21 mm was noted along with a right ovarian ankle free thinwall avascular cyst with low level echoes measuring 36 x 44 mm. Small amount of free fluid was seen the cul-de-sac.   she was given a shot of Depo-Provera 150 mg IM and was to return back to the office in 3 months for follow-up ultrasound. Patient also had a normal C1 25 with a value of 7.4.   Her primary had done an ultrasound on July 12 at a different facility and the report was as follows:  findings : The uterus is surgically absent. There is a 2.9 x 4.4 x 2.7 cm right ovarian cyst. The left ovary was not seen.  No free fluid was noted in the pelvic cavity.   exam:  well-developed well-nourished female with complaints of right lower quadrant pain. Abdomen: soft slightly tender on the right lower quadrant no rebound or guarding Back: No CVA tenderness Pelvic: Bartholin urethra Skene was within normal limits Vaginal cuff intact Bimanual exam no palpable masses SOME tenderness on the right lower quadrant. Rectovaginal exam slight tenderness a right adnexa. Negative Rovsing, negative Psoas  Sign and negative heel tap sign   Assessment/plan: Patient with persistent right ovarian cyst causing pain. Patient like surgical intervention. She will receive a shot of Depo-Provera 150 mg IM today. She'll also receive Toradol 60 mg one shot today IM and then she'll be prescribed 10 mg 1 by mouth 4  times a day  For the next 5 days. She is going to be scheduled for sometime next month for laparoscopic right ovarian cystectomy possible right salpingo-oophorectomy. Literature information was provided.

## 2015-05-23 NOTE — Patient Instructions (Signed)
Ovarian Cyst An ovarian cyst is a fluid-filled sac that forms on an ovary. The ovaries are small organs that produce eggs in women. Various types of cysts can form on the ovaries. Most are not cancerous. Many do not cause problems, and they often go away on their own. Some may cause symptoms and require treatment. Common types of ovarian cysts include:  Functional cysts--These cysts may occur every month during the menstrual cycle. This is normal. The cysts usually go away with the next menstrual cycle if the woman does not get pregnant. Usually, there are no symptoms with a functional cyst.  Endometrioma cysts--These cysts form from the tissue that lines the uterus. They are also called "chocolate cysts" because they become filled with blood that turns brown. This type of cyst can cause pain in the lower abdomen during intercourse and with your menstrual period.  Cystadenoma cysts--This type develops from the cells on the outside of the ovary. These cysts can get very big and cause lower abdomen pain and pain with intercourse. This type of cyst can twist on itself, cut off its blood supply, and cause severe pain. It can also easily rupture and cause a lot of pain.  Dermoid cysts--This type of cyst is sometimes found in both ovaries. These cysts may contain different kinds of body tissue, such as skin, teeth, hair, or cartilage. They usually do not cause symptoms unless they get very big.  Theca lutein cysts--These cysts occur when too much of a certain hormone (human chorionic gonadotropin) is produced and overstimulates the ovaries to produce an egg. This is most common after procedures used to assist with the conception of a baby (in vitro fertilization). CAUSES   Fertility drugs can cause a condition in which multiple large cysts are formed on the ovaries. This is called ovarian hyperstimulation syndrome.  A condition called polycystic ovary syndrome can cause hormonal imbalances that can lead to  nonfunctional ovarian cysts. SIGNS AND SYMPTOMS  Many ovarian cysts do not cause symptoms. If symptoms are present, they may include:  Pelvic pain or pressure.  Pain in the lower abdomen.  Pain during sexual intercourse.  Increasing girth (swelling) of the abdomen.  Abnormal menstrual periods.  Increasing pain with menstrual periods.  Stopping having menstrual periods without being pregnant. DIAGNOSIS  These cysts are commonly found during a routine or annual pelvic exam. Tests may be ordered to find out more about the cyst. These tests may include:  Ultrasound.  X-ray of the pelvis.  CT scan.  MRI.  Blood tests. TREATMENT  Many ovarian cysts go away on their own without treatment. Your health care provider may want to check your cyst regularly for 2-3 months to see if it changes. For women in menopause, it is particularly important to monitor a cyst closely because of the higher rate of ovarian cancer in menopausal women. When treatment is needed, it may include any of the following:  A procedure to drain the cyst (aspiration). This may be done using a long needle and ultrasound. It can also be done through a laparoscopic procedure. This involves using a thin, lighted tube with a tiny camera on the end (laparoscope) inserted through a small incision.  Surgery to remove the whole cyst. This may be done using laparoscopic surgery or an open surgery involving a larger incision in the lower abdomen.  Hormone treatment or birth control pills. These methods are sometimes used to help dissolve a cyst. HOME CARE INSTRUCTIONS   Only take over-the-counter   or prescription medicines as directed by your health care provider.  Follow up with your health care provider as directed.  Get regular pelvic exams and Pap tests. SEEK MEDICAL CARE IF:   Your periods are late, irregular, or painful, or they stop.  Your pelvic pain or abdominal pain does not go away.  Your abdomen becomes  larger or swollen.  You have pressure on your bladder or trouble emptying your bladder completely.  You have pain during sexual intercourse.  You have feelings of fullness, pressure, or discomfort in your stomach.  You lose weight for no apparent reason.  You feel generally ill.  You become constipated.  You lose your appetite.  You develop acne.  You have an increase in body and facial hair.  You are gaining weight, without changing your exercise and eating habits.  You think you are pregnant. SEEK IMMEDIATE MEDICAL CARE IF:   You have increasing abdominal pain.  You feel sick to your stomach (nauseous), and you throw up (vomit).  You develop a fever that comes on suddenly.  You have abdominal pain during a bowel movement.  Your menstrual periods become heavier than usual. MAKE SURE YOU:  Understand these instructions.  Will watch your condition.  Will get help right away if you are not doing well or get worse. Document Released: 10/15/2005 Document Revised: 10/20/2013 Document Reviewed: 06/22/2013 [facility] Patient Information 2015 [place], [place]. This information is not intended to replace advice given to you by your health care provider. Make sure you discuss any questions you have with your health care provider. Ketorolac tablets What is this medicine? KETOROLAC (kee toe ROLE ak) is a non-steroidal anti-inflammatory drug (NSAID). It is used for a short while to treat moderate to severe pain, including pain after surgery. It should not be used for more than 5 days. This medicine may be used for other purposes; ask your health care provider or pharmacist if you have questions. COMMON BRAND NAME(S): Toradol What should I tell my health care provider before I take this medicine? They need to know if you have any of these conditions: -asthma -bleeding problems like hemophilia -cigarette smoker -drink more than 3 alcohol containing drinks a day -heart disease or  circulation problems such as heart failure or leg edema (fluid retention) -high blood pressure -kidney disease -liver disease -stomach bleeding or ulcers -an unusual or allergic reaction to ketorolac, aspirin, other NSAIDs, other medicines, foods, dyes, or preservatives -pregnant or trying to get pregnant -breast-feeding How should I use this medicine? Take this medicine by mouth with a full glass of water. Follow the directions on the prescription label. Take your medicine at regular intervals. Do not take your medicine more often than directed. Do not take more than the recommended dose. A special MedGuide will be given to you by the pharmacist with each prescription and refill. Be sure to read this information carefully each time. Talk to your pediatrician regarding the use of this medicine in children. While this drug may be prescribed for children as young as 2 years of age for selected conditions, precautions do apply. Patients over 58 years old may have a stronger reaction and need a smaller dose. Overdosage: If you think you have taken too much of this medicine contact a poison control center or emergency room at once. NOTE: This medicine is only for you. Do not share this medicine with others. What if I miss a dose? If you miss a dose, take it as soon as you  can. If it is almost time for your next dose, take only that dose. Do not take double or extra doses. What may interact with this medicine? Do not take this medicine with any of the following medications: -aspirin and aspirin-like medicines -cidofovir -methotrexate -NSAIDs, medicines for pain and inflammation, like ibuprofen or naproxen -pemetrexed -probenecid This medicine may also interact with the following medications: -alcohol -alendronate -alprazolam -carbamazepine -cyclosporine -diuretics -flavocoxid -fluoxetine -ginkgo -lithium -medicines for high blood pressure like enalapril -medicines that affect  platelets like pentoxifylline -medicines that treat or prevent blood clots like heparin, warfarin -muscle relaxants -phenytoin -steroid medicines like prednisone or cortisone -thiothixene This list may not describe all possible interactions. Give your health care provider a list of all the medicines, herbs, non-prescription drugs, or dietary supplements you use. Also tell them if you smoke, drink alcohol, or use illegal drugs. Some items may interact with your medicine. What should I watch for while using this medicine? Tell your doctor or health care professional if your pain does not get better. Talk to your doctor before taking another medicine for pain. Do not treat yourself. This medicine does not prevent heart attack or stroke. In fact, this medicine may increase the chance of a heart attack or stroke. The chance may increase with longer use of this medicine and in people who have heart disease. If you take aspirin to prevent heart attack or stroke, talk with your doctor or health care professional. Do not take medicines such as ibuprofen and naproxen with this medicine. Side effects such as stomach upset, nausea, or ulcers may be more likely to occur. Many medicines available without a prescription should not be taken with this medicine. This medicine can cause ulcers and bleeding in the stomach and intestines at any time during treatment. Do not smoke cigarettes or drink alcohol. These increase irritation to your stomach and can make it more susceptible to damage from this medicine. Ulcers and bleeding can happen without warning symptoms and can cause death. You may get drowsy or dizzy. Do not drive, use machinery, or do anything that needs mental alertness until you know how this medicine affects you. Do not stand or sit up quickly, especially if you are an older patient. This reduces the risk of dizzy or fainting spells. This medicine can cause you to bleed more easily. Try to avoid damage to  your teeth and gums when you brush or floss your teeth. What side effects may I notice from receiving this medicine? Side effects that you should report to your doctor or health care professional as soon as possible: -allergic reactions like skin rash, itching or hives, swelling of the face, lips, or tongue -breathing problems -high blood pressure -nausea, vomiting -redness, blistering, peeling or loosening of the skin, including inside the mouth -severe stomach pain -signs and symptoms of bleeding such as bloody or black, tarry stools; red or dark-brown urine; spitting up blood or brown material that looks like coffee grounds; red spots on the skin; unusual bruising or bleeding from the eye, gums, or nose -signs and symptoms of a stroke like changes in vision; confusion; trouble speaking or understanding; severe headaches; sudden numbness or weakness of the face, arm or leg; trouble walking; dizziness; loss of balance or coordination -trouble passing urine or change in the amount of urine -unexplained weight gain or swelling -unusually weak or tired -yellowing of eyes or skin Side effects that usually do not require medical attention (report to your doctor or health care professional  if they continue or are bothersome): -diarrhea -dizziness -headache -heartburn This list may not describe all possible side effects. Call your doctor for medical advice about side effects. You may report side effects to FDA at 1-800-FDA-1088. Where should I keep my medicine? Keep out of the reach of children. Store at room temperature between 20 and 25 degrees C (68 and 77 degrees F). Throw away any unused medicine after the expiration date. NOTE: This sheet is a summary. It may not cover all possible information. If you have questions about this medicine, talk to your doctor, pharmacist, or health care provider.  2015, [employer]. (2013-03-03 16:36:05) Ketorolac injection What is this  medicine? KETOROLAC (kee toe ROLE ak) is a non-steroidal anti-inflammatory drug (NSAID). It is used to treat moderate to severe pain for up to 5 days. It is commonly used after surgery. This medicine should not be used for more than 5 days. This medicine may be used for other purposes; ask your health care provider or pharmacist if you have questions. COMMON BRAND NAME(S): Toradol What should I tell my health care provider before I take this medicine? They need to know if you have any of these conditions: -asthma, especially aspirin-sensitive asthma -bleeding problems -kidney disease -stomach bleed, ulcer, or other problem -taking aspirin, other NSAID, or probenecid -an unusual or allergic reaction to ketorolac, tromethamine, aspirin, other NSAIDs, other medicines, foods, dyes or preservatives -pregnant or trying to get pregnant -breast-feeding How should I use this medicine? This medicine is for injection into a muscle or into a vein. It is given by a health care professional in a hospital or clinic setting. Talk to your pediatrician regarding the use of this medicine in children. While this drug may be prescribed for children as young as 58 years old for selected conditions, precautions do apply. Patients over 29 years old may have a stronger reaction and need a smaller dose. Overdosage: If you think you have taken too much of this medicine contact a poison control center or emergency room at once. NOTE: This medicine is only for you. Do not share this medicine with others. What if I miss a dose? This does not apply. What may interact with this medicine? Do not take this medicine with any of the following medications: -aspirin and aspirin-like medicines -cidofovir -methotrexate -NSAIDs, medicines for pain and inflammation, like ibuprofen or naproxen -pentoxifylline -probenecid This medicine may also interact with the following  medications: -alcohol -alendronate -alprazolam -carbamazepine -diuretics -flavocoxid -fluoxetine -ginkgo -lithium -medicines for blood pressure like enalapril -medicines that affect platelets like pentoxifylline -medicines that treat or prevent blood clots like heparin, warfarin -muscle relaxants -pemetrexed -phenytoin -thiothixene This list may not describe all possible interactions. Give your health care provider a list of all the medicines, herbs, non-prescription drugs, or dietary supplements you use. Also tell them if you smoke, drink alcohol, or use illegal drugs. Some items may interact with your medicine. What should I watch for while using this medicine? Tell your doctor or healthcare professional if your symptoms do not start to get better or if they get worse. This medicine does not prevent heart attack or stroke. In fact, this medicine may increase the chance of a heart attack or stroke. The chance may increase with longer use of this medicine and in people who have heart disease. If you take aspirin to prevent heart attack or stroke, talk with your doctor or health care professional. Do not take medicines such as ibuprofen and naproxen with this medicine.  Side effects such as stomach upset, nausea, or ulcers may be more likely to occur. Many medicines available without a prescription should not be taken with this medicine. This medicine can cause ulcers and bleeding in the stomach and intestines at any time during treatment. Do not smoke cigarettes or drink alcohol. These increase irritation to your stomach and can make it more susceptible to damage from this medicine. Ulcers and bleeding can happen without warning symptoms and can cause death. This medicine can cause you to bleed more easily. Try to avoid damage to your teeth and gums when you brush or floss your teeth. What side effects may I notice from receiving this medicine? Side effects that you should report to your  doctor or health care professional as soon as possible: -allergic reactions like skin rash, itching or hives, swelling of the face, lips, or tongue -breathing problems -high blood pressure -nausea, vomiting -redness, blistering, peeling or loosening of the skin, including inside the mouth -severe stomach pain -signs and symptoms of bleeding such as bloody or black, tarry stools; red or dark-brown urine; spitting up blood or brown material that looks like coffee grounds; red spots on the skin; unusual bruising or bleeding from the eye, gums, or nose -signs and symptoms of a blood clot changes in vision; chest pain; severe, sudden headache; trouble speaking; sudden numbness or weakness of the face, arm, or leg -trouble passing urine or change in the amount of urine -unexplained weight gain or swelling -unusually weak or tired -yellowing of eyes or skin Side effects that usually do not require medical attention (report to your doctor or health care professional if they continue or are bothersome): -diarrhea -dizziness -headache -heartburn This list may not describe all possible side effects. Call your doctor for medical advice about side effects. You may report side effects to FDA at 1-800-FDA-1088. Where should I keep my medicine? This drug is given in a hospital or clinic and will not be stored at home. NOTE: This sheet is a summary. It may not cover all possible information. If you have questions about this medicine, talk to your doctor, pharmacist, or health care provider.  2015, [employer] Standard. (2013-03-03 16:34:56) Diagnostic Laparoscopy Laparoscopy is a surgical procedure. It is used to diagnose and treat diseases inside the belly (abdomen). It is usually a brief, common, and relatively simple procedure. The laparoscopeis a thin, lighted, pencil-sized instrument. It is like a telescope. It is inserted into your abdomen through a small cut (incision). Your caregiver can look at  the organs inside your body through this instrument. He or she can see if there is anything abnormal. Laparoscopy can be done either in a hospital or outpatient clinic. You may be given a mild sedative to help you relax before the procedure. Once in the operating room, you will be given a drug to make you sleep (general anesthesia). Laparoscopy usually lasts less than 1 hour. After the procedure, you will be monitored in a recovery area until you are stable and doing well. Once you are home, it will take 2 to 3 days to fully recover. RISKS AND COMPLICATIONS  Laparoscopy has relatively few risks. Your caregiver will discuss the risks with you before the procedure. Some problems that can occur include:  Infection.  Bleeding.  Damage to other organs.  Anesthetic side effects. PROCEDURE Once you receive anesthesia, your surgeon inflates the abdomen with a harmless gas (carbon dioxide). This makes the organs easier to see. The laparoscope is inserted into the  abdomen through a small incision. This allows your surgeon to see into the abdomen. Other small instruments are also inserted into the abdomen through other small openings. Many surgeons attach a video camera to the laparoscope to enlarge the view. During a diagnostic laparoscopy, the surgeon may be looking for inflammation, infection, or cancer. Your surgeon may take tissue samples(biopsies). The samples are sent to a specialist in looking at cells and tissue samples (pathologist). The pathologist examines them under a microscope. Biopsies can help to diagnose or confirm a disease. AFTER THE PROCEDURE   The gas is released from inside the abdomen.  The incisions are closed with stitches (sutures). Because these incisions are small (usually less than 1/2 inch), there is usually minimal discomfort after the procedure. There may be some mild discomfort in the throat. This is from the tube placed in the throat while you were sleeping. You may have  some mild abdominal discomfort. There may also be discomfort from the instrument placement incisions in the abdomen.  The recovery time is shortened as long as there are no complications.  You will rest in a recovery room until stable and doing well. As long as there are no complications, you may be allowed to go home. FINDING OUT THE RESULTS OF YOUR TEST Not all test results are available during your visit. If your test results are not back during the visit, make an appointment with your caregiver to find out the results. Do not assume everything is normal if you have not heard from your caregiver or the medical facility. It is important for you to follow up on all of your test results. HOME CARE INSTRUCTIONS   Take all medicines as directed.  Only take over-the-counter or prescription medicines for pain, discomfort, or fever as directed by your caregiver.  Resume daily activities as directed.  Showers are preferred over baths.  You may resume sexual activities in 1 week or as directed.  Do not drive while taking narcotics. SEEK MEDICAL CARE IF:   There is increasing abdominal pain.  There is new pain in the shoulders (shoulder strap areas).  You feel lightheaded or faint.  You have the chills.  You or your child has an oral temperature above 102 F (38.9 C).  There is pus-like (purulent) drainage from any of the wounds.  You are unable to pass gas or have a bowel movement.  You feel sick to your stomach (nauseous) or throw up (vomit). MAKE SURE YOU:   Understand these instructions.  Will watch your condition.  Will get help right away if you are not doing well or get worse. Document Released: 01/21/2001 Document Revised: 02/09/2013 Document Reviewed: 10/15/2007 [facility] Patient Information 2015 [place], [place]. This information is not intended to replace advice given to you by your health care provider. Make sure you discuss any questions you have with your health care  provider.

## 2015-05-24 LAB — CA 125: CA 125: 8 U/mL (ref <35)

## 2015-05-25 ENCOUNTER — Encounter: Payer: Self-pay | Admitting: Gynecology

## 2015-06-21 ENCOUNTER — Encounter (HOSPITAL_COMMUNITY): Payer: Self-pay

## 2015-06-21 ENCOUNTER — Other Ambulatory Visit: Payer: Self-pay

## 2015-06-21 ENCOUNTER — Encounter (HOSPITAL_COMMUNITY)
Admission: RE | Admit: 2015-06-21 | Discharge: 2015-06-21 | Disposition: A | Discharge status: Home / Self Care | Payer: BLUE CROSS/BLUE SHIELD | Source: Ambulatory Visit | Attending: Gynecology | Admitting: Gynecology

## 2015-06-21 DIAGNOSIS — Z01810 Encounter for preprocedural cardiovascular examination: Secondary | ICD-10-CM | POA: Diagnosis not present

## 2015-06-21 DIAGNOSIS — E785 Hyperlipidemia, unspecified: Secondary | ICD-10-CM | POA: Insufficient documentation

## 2015-06-21 DIAGNOSIS — F419 Anxiety disorder, unspecified: Secondary | ICD-10-CM | POA: Insufficient documentation

## 2015-06-21 DIAGNOSIS — Z833 Family history of diabetes mellitus: Secondary | ICD-10-CM | POA: Diagnosis not present

## 2015-06-21 DIAGNOSIS — N8320 Unspecified ovarian cysts: Secondary | ICD-10-CM | POA: Insufficient documentation

## 2015-06-21 DIAGNOSIS — D649 Anemia, unspecified: Secondary | ICD-10-CM | POA: Diagnosis not present

## 2015-06-21 DIAGNOSIS — Z803 Family history of malignant neoplasm of breast: Secondary | ICD-10-CM | POA: Diagnosis not present

## 2015-06-21 DIAGNOSIS — Z8249 Family history of ischemic heart disease and other diseases of the circulatory system: Secondary | ICD-10-CM | POA: Insufficient documentation

## 2015-06-21 DIAGNOSIS — I10 Essential (primary) hypertension: Secondary | ICD-10-CM | POA: Insufficient documentation

## 2015-06-21 DIAGNOSIS — Z01812 Encounter for preprocedural laboratory examination: Secondary | ICD-10-CM | POA: Insufficient documentation

## 2015-06-21 LAB — CBC
HEMATOCRIT: 37.8 % (ref 36.0–46.0)
HEMOGLOBIN: 12.5 g/dL (ref 12.0–15.0)
MCH: 27.8 pg (ref 26.0–34.0)
MCHC: 33.1 g/dL (ref 30.0–36.0)
MCV: 84 fL (ref 78.0–100.0)
Platelets: 197 10*3/uL (ref 150–400)
RBC: 4.5 MIL/uL (ref 3.87–5.11)
RDW: 15.5 % (ref 11.5–15.5)
WBC: 5.8 10*3/uL (ref 4.0–10.5)

## 2015-06-21 LAB — BASIC METABOLIC PANEL
Anion gap: 9 (ref 5–15)
BUN: 14 mg/dL (ref 6–20)
CHLORIDE: 106 mmol/L (ref 101–111)
CO2: 23 mmol/L (ref 22–32)
Calcium: 9.4 mg/dL (ref 8.9–10.3)
Creatinine, Ser: 0.83 mg/dL (ref 0.44–1.00)
GFR calc non Af Amer: >60 mL/min (ref >60)
Glucose, Bld: 106 mg/dL — ABNORMAL HIGH (ref 65–99)
Potassium: 3.3 mmol/L — ABNORMAL LOW (ref 3.5–5.1)
Sodium: 138 mmol/L (ref 135–145)

## 2015-06-21 LAB — ABO/RH: ABO/RH(D): AB NEG

## 2015-06-21 NOTE — Patient Instructions (Signed)
   Your procedure is scheduled on: AUG 30 AT 1030  Enter through the Main Entrance of [facility] at:9AM  Pick up the phone at the desk and dial [phone] and inform us of your arrival.  Please call this number if you have any problems the morning of surgery: [phone]  Remember: DO NOT EAT OR DRINK LIQUIDS NIGHT BEFORE SURGERY ([place])  Take these medicines the morning of surgery with a SIP OF WATER: TAKE BLOOD PRESSURE AND LIPTOR WITH SIP OF WATER  Do not wear jewelry, make-up, or FINGER nail polish No metal in your hair or on your body. Do not wear lotions, powders, perfumes.  You may wear deodorant.  Do not bring valuables to the hospital. Contacts, dentures or bridgework may not be worn into surgery.      Patients discharged on the day of surgery will not be allowed to drive home.

## 2015-06-22 ENCOUNTER — Other Ambulatory Visit: Payer: Self-pay | Admitting: Gynecology

## 2015-06-22 ENCOUNTER — Encounter: Payer: Self-pay | Admitting: Gynecology

## 2015-06-22 ENCOUNTER — Ambulatory Visit (INDEPENDENT_AMBULATORY_CARE_PROVIDER_SITE_OTHER): Payer: BLUE CROSS/BLUE SHIELD

## 2015-06-22 ENCOUNTER — Ambulatory Visit (INDEPENDENT_AMBULATORY_CARE_PROVIDER_SITE_OTHER): Payer: BLUE CROSS/BLUE SHIELD | Admitting: Gynecology

## 2015-06-22 VITALS — BP 130/82

## 2015-06-22 DIAGNOSIS — N83201 Unspecified ovarian cyst, right side: Secondary | ICD-10-CM

## 2015-06-22 DIAGNOSIS — N8320 Unspecified ovarian cysts: Secondary | ICD-10-CM

## 2015-06-22 DIAGNOSIS — IMO0002 Reserved for concepts with insufficient information to code with codable children: Secondary | ICD-10-CM

## 2015-06-22 DIAGNOSIS — Z01818 Encounter for other preprocedural examination: Secondary | ICD-10-CM | POA: Diagnosis not present

## 2015-06-22 DIAGNOSIS — N941 Dyspareunia: Secondary | ICD-10-CM | POA: Diagnosis not present

## 2015-06-22 DIAGNOSIS — R102 Pelvic and perineal pain unspecified side: Secondary | ICD-10-CM

## 2015-06-22 LAB — TYPE AND SCREEN
ABO/RH(D): AB NEG
Antibody Screen: NEGATIVE

## 2015-06-22 MED ORDER — OXYCODONE-ACETAMINOPHEN 5-325 MG PO TABS: 1.0000 | ORAL_TABLET | ORAL | Status: DC | PRN

## 2015-06-22 MED ORDER — METOCLOPRAMIDE HCL 10 MG PO TABS: 10.0000 mg | ORAL_TABLET | Freq: Three times a day (TID) | ORAL | Status: DC

## 2015-06-22 NOTE — Patient Instructions (Signed)

## 2015-06-22 NOTE — Progress Notes (Signed)
Madeline Hawkins is an 49 y.o. female. For preoperative examination. Patient scheduled at the end of the month for laparoscopic right salpingo-oophorectomy as a result of persistent ovarian cyst and chronic right lower quadrant pain.Review of her record indicated that back in 2013 she had a total abdominal hysterectomy for leiomyomatous uteri along with left ovarian cystectomy which was a hemorrhagic cyst. An ultrasound was done here in the office on 04/02/2014 demonstrated the following:  Absent uterus. Vaginal cuff normal. A left ovarian corpus luteum cyst measuring 23 x 21 mm was noted along with a right ovarian ankle free thinwall avascular cyst with low level echoes measuring 36 x 44 mm. Small amount of free fluid was seen the cul-de-sac.  she was given a shot of Depo-Provera 150 mg IM and was to return back to the office in 3 months for follow-up ultrasound. Patient also had a normal C1 25 with a value of 7.4.  Her primary had done an ultrasound on July 12 at a different facility and the report was as follows: findings : The uterus is surgically absent. There is a 2.9 x 4.4 x 2.7 cm right ovarian cyst. The left ovary was not seen. No free fluid was noted in the pelvic cavity.  She once again received a shot of Depo-Provera and the ultrasound today demonstrated the following:  Absent uterus. Right ovary with thinwall cyst measuring 31 x 25 x 29 mm every size 2.5 cm slightly decreased in size from previous scan 2015 2016. Avascular with diffuse internal low level echoes arterial blood flow was seen to the right ovary. Left ovary was otherwise normal and no fluid in the cul-de-sac.  Patient with persistent right lower quadrant pain was to proceed with laparoscopic removal of right tube and ovary.   Pertinent Gynecological History: Menses: none Bleeding: none Contraception: none DES exposure: unknown Blood transfusions: none Sexually transmitted diseases: no past history Previous GYN  Procedures: 4 nsvd, tubal ligation, TAH, l. ovarian cysatectomy  Last mammogram: normal Date: 2016 Last pap: normal Date: 2013 OB History: G4, P4  Menstrual History: Menarche age: 58  Patient's last menstrual period was 11/10/2011.    Past Medical History  Diagnosis Date  . CIN I (cervical intraepithelial neoplasia I) 1989    cryo...also in 2008 .. had cryo   . Hypertension   . Anxiety     Past Surgical History  Procedure Laterality Date  . Salpingectomy      left for ectopic pregnancy  . Gynecologic cryosurgery  07/07/2007  . Tubal ligation      right tubal ligation  . Laparoscopy  11/22/2011    Procedure: LAPAROSCOPY DIAGNOSTIC;  Surgeon: Terrance Mass, MD;  Location: [facility];  Service: Gynecology;  Laterality: N/A;  . Abdominal hysterectomy  11/22/2011    Procedure: HYSTERECTOMY ABDOMINAL;  Surgeon: Terrance Mass, MD;  Location: [facility];  Service: Gynecology;  Laterality: N/A;  . Ovarian cyst removal  11/22/2011    Procedure: OVARIAN CYSTECTOMY;  Surgeon: Terrance Mass, MD;  Location: [facility];  Service: Gynecology;  Laterality: Left;  . Colon polyps removed      Family History  Problem Relation Age of Onset  . Diabetes Brother   . Ovarian cancer Mother   . Ovarian cancer Paternal Aunt   . Heart disease Maternal Grandmother   . Breast cancer Maternal Grandmother     Age 2's  . Breast cancer Maternal Aunt     Age 11's    Social History:  reports that she has never smoked. She has never used smokeless tobacco. She reports that she does not drink alcohol or use illicit drugs.  Allergies: No Known Allergies   (Not in a hospital admission)  REVIEW OF SYSTEMS: A ROS was performed and pertinent positives and negatives are included in the history.  GENERAL: No fevers or chills. HEENT: No change in vision, no earache, sore throat or sinus congestion. NECK: No pain or stiffness. CARDIOVASCULAR: No chest pain or pressure. No palpitations. PULMONARY: No shortness of  breath, cough or wheeze. GASTROINTESTINAL: No abdominal pain, nausea, vomiting or diarrhea, melena or bright red blood per rectum. GENITOURINARY: No urinary frequency, urgency, hesitancy or dysuria. MUSCULOSKELETAL: No joint or muscle pain, no back pain, no recent trauma. DERMATOLOGIC: No rash, no itching, no lesions. ENDOCRINE: No polyuria, polydipsia, no heat or cold intolerance. No recent change in weight. HEMATOLOGICAL: No anemia or easy bruising or bleeding. NEUROLOGIC: No headache, seizures, numbness, tingling or weakness. PSYCHIATRIC: No depression, no loss of interest in normal activity or change in sleep pattern.     Blood pressure 130/82, last menstrual period 11/10/2011.  Physical Exam:  HEENT:unremarkable Neck:Supple, midline, no thyroid megaly, no carotid bruits Lungs:  Clear to auscultation no rhonchi's or wheezes Heart:Regular rate and rhythm, no murmurs or gallops Breast Exam: Both breasts are symmetrical in appearance no palpable masses or tenderness no supraclavicular axillary lymphadenopathy Abdomen: Soft nontender no rebound or guarding Pelvic:BUS within normal limits Vagina: No lesions or discharge Cervix: Absent Uterus: Absent Adnexa: Tenderness in right lower quadrant Extremities: No cords, no edema Rectal: Unremarkable  No results found for this or any previous visit (from the past 24 hour(s)).  No results found.  Assessment/Plan: Patient with persistent right lower quadrant pain and persistent right ovarian cyst despite Depo-Provera injection 2. Patient scheduled for laparoscopic right salpingo-oophorectomy and lysis of pelvic adhesions at the present. Surgical risks for surgery discussed as follows:                        Patient was counseled as to the risk of surgery to include the following:  1. Infection (prohylactic antibiotics will be administered)  2. DVT/Pulmonary Embolism (prophylactic pneumo compression stockings will be used)  3.Trauma to  internal organs requiring additional surgical procedure to repair any injury to     Internal organs requiring perhaps additional hospitalization days.  4.Hemmorhage requiring transfusion and blood products which carry risks such as             anaphylactic reaction, hepatitis and AIDS  Patient had received literature information on the procedure scheduled and all her questions were answered and fully accepts all risk.   [facility] HMD11:55 AMTD@Note :    Terrance Mass 06/22/2015, 9:42 AM  Note: This dictation was prepared with  Dragon/digital dictation along withSmart phrase technology. Any transcriptional errors that result from this process are unintentional.

## 2015-06-27 ENCOUNTER — Telehealth: Payer: Self-pay

## 2015-06-27 MED ORDER — DEXTROSE 5 % IV SOLN
2.0000 g | INTRAVENOUS | Status: AC
  Administered 2015-06-28: 2 g via INTRAVENOUS
  Filled 2015-06-27: qty 2

## 2015-06-27 NOTE — Telephone Encounter (Signed)
Patient called to verify her surgery start time in the morning as she had recently been moved up to an earlier slot due to cancellation. I confirmed her surgery starts at 7:30am and she should check in 6:15am no later than 6:30am and NPO after MN.

## 2015-06-27 NOTE — Anesthesia Preprocedure Evaluation (Signed)
Anesthesia Evaluation  Patient identified by MRN, date of birth, ID band Patient awake    Reviewed: Allergy & Precautions, H&P , NPO status , Patient's Chart, lab work & pertinent test results, reviewed documented beta blocker date and time   History of Anesthesia Complications Negative for: history of anesthetic complications  Airway Mallampati: I  TM Distance: >3 FB Neck ROM: full    Dental  (+) Chipped   Pulmonary neg pulmonary ROS,  breath sounds clear to auscultation  Pulmonary exam normal       Cardiovascular Exercise Tolerance: Good hypertension, On Medications Rhythm:regular Rate:Normal     Neuro/Psych negative neurological ROS  negative psych ROS   GI/Hepatic Neg liver ROS, GERD- (no meds)  ,  Endo/Other  negative endocrine ROS  Renal/GU negative Renal ROS  negative genitourinary   Musculoskeletal   Abdominal   Peds  Hematology negative hematology ROS (+)   Anesthesia Other Findings   Reproductive/Obstetrics negative OB ROS                             Anesthesia Physical  Anesthesia Plan  ASA: II  Anesthesia Plan: General ETT   Post-op Pain Management:    Induction:   Airway Management Planned:   Additional Equipment:   Intra-op Plan:   Post-operative Plan:   Informed Consent: I have reviewed the patients History and Physical, chart, labs and discussed the procedure including the risks, benefits and alternatives for the proposed anesthesia with the patient or authorized representative who has indicated his/her understanding and acceptance.   Dental Advisory Given  Plan Discussed with: CRNA and Surgeon  Anesthesia Plan Comments:         Anesthesia Quick Evaluation

## 2015-06-28 ENCOUNTER — Ambulatory Visit (HOSPITAL_COMMUNITY): Payer: BLUE CROSS/BLUE SHIELD | Admitting: Anesthesiology

## 2015-06-28 ENCOUNTER — Ambulatory Visit (HOSPITAL_COMMUNITY)
Admission: RE | Admit: 2015-06-28 | Discharge: 2015-06-28 | Disposition: A | Discharge status: Home / Self Care | Payer: BLUE CROSS/BLUE SHIELD | Source: Ambulatory Visit | Attending: Gynecology | Admitting: Gynecology

## 2015-06-28 ENCOUNTER — Encounter (HOSPITAL_COMMUNITY)
Admission: RE | Disposition: A | Discharge status: Home / Self Care | Payer: Self-pay | Source: Ambulatory Visit | Attending: Gynecology

## 2015-06-28 DIAGNOSIS — N831 Corpus luteum cyst: Secondary | ICD-10-CM | POA: Insufficient documentation

## 2015-06-28 DIAGNOSIS — N809 Endometriosis, unspecified: Secondary | ICD-10-CM | POA: Diagnosis not present

## 2015-06-28 DIAGNOSIS — I10 Essential (primary) hypertension: Secondary | ICD-10-CM | POA: Diagnosis not present

## 2015-06-28 DIAGNOSIS — K219 Gastro-esophageal reflux disease without esophagitis: Secondary | ICD-10-CM | POA: Diagnosis not present

## 2015-06-28 DIAGNOSIS — R102 Pelvic and perineal pain: Secondary | ICD-10-CM | POA: Diagnosis not present

## 2015-06-28 DIAGNOSIS — N8320 Unspecified ovarian cysts: Secondary | ICD-10-CM | POA: Diagnosis not present

## 2015-06-28 DIAGNOSIS — N736 Female pelvic peritoneal adhesions (postinfective): Secondary | ICD-10-CM | POA: Diagnosis not present

## 2015-06-28 HISTORY — PX: LAPAROSCOPIC UNILATERAL SALPINGO OOPHERECTOMY: SHX5935

## 2015-06-28 HISTORY — PX: LAPAROSCOPIC LYSIS OF ADHESIONS: SHX5905

## 2015-06-28 SURGERY — SALPINGO-OOPHORECTOMY, UNILATERAL, LAPAROSCOPIC
Anesthesia: General | Site: Abdomen | Laterality: Right

## 2015-06-28 MED ORDER — ONDANSETRON HCL 4 MG/2ML IJ SOLN
INTRAMUSCULAR | Status: DC | PRN
  Administered 2015-06-28: 4 mg via INTRAVENOUS

## 2015-06-28 MED ORDER — DEXAMETHASONE SODIUM PHOSPHATE 10 MG/ML IJ SOLN
INTRAMUSCULAR | Status: DC | PRN
  Administered 2015-06-28: 4 mg via INTRAVENOUS

## 2015-06-28 MED ORDER — FENTANYL CITRATE (PF) 100 MCG/2ML IJ SOLN
25.0000 ug | INTRAMUSCULAR | Status: DC | PRN
  Administered 2015-06-28 (×2): 25 ug via INTRAVENOUS

## 2015-06-28 MED ORDER — NEOSTIGMINE METHYLSULFATE 10 MG/10ML IV SOLN
INTRAVENOUS | Status: DC | PRN
  Administered 2015-06-28: 3 mg via INTRAVENOUS

## 2015-06-28 MED ORDER — EPHEDRINE SULFATE 50 MG/ML IJ SOLN
INTRAMUSCULAR | Status: DC | PRN
  Administered 2015-06-28: 5 mg via INTRAVENOUS

## 2015-06-28 MED ORDER — SCOPOLAMINE 1 MG/3DAYS TD PT72
MEDICATED_PATCH | TRANSDERMAL | Status: AC
  Administered 2015-06-28: 1.5 mg via TRANSDERMAL
  Filled 2015-06-28: qty 1

## 2015-06-28 MED ORDER — HEPARIN SODIUM (PORCINE) 5000 UNIT/ML IJ SOLN
INTRAMUSCULAR | Status: AC
  Filled 2015-06-28: qty 1

## 2015-06-28 MED ORDER — PHENYLEPHRINE HCL 10 MG/ML IJ SOLN
INTRAMUSCULAR | Status: DC | PRN
  Administered 2015-06-28 (×2): 40 mg via INTRAVENOUS

## 2015-06-28 MED ORDER — KETOROLAC TROMETHAMINE 30 MG/ML IJ SOLN
INTRAMUSCULAR | Status: AC
  Filled 2015-06-28: qty 1

## 2015-06-28 MED ORDER — KETOROLAC TROMETHAMINE 30 MG/ML IJ SOLN
INTRAMUSCULAR | Status: DC | PRN
  Administered 2015-06-28: 30 mg via INTRAVENOUS

## 2015-06-28 MED ORDER — BUPIVACAINE HCL (PF) 0.25 % IJ SOLN
INTRAMUSCULAR | Status: DC | PRN
  Administered 2015-06-28: 20 mL

## 2015-06-28 MED ORDER — MIDAZOLAM HCL 2 MG/2ML IJ SOLN
INTRAMUSCULAR | Status: DC | PRN
  Administered 2015-06-28: 2 mg via INTRAVENOUS

## 2015-06-28 MED ORDER — LACTATED RINGERS IV SOLN
INTRAVENOUS | Status: DC
  Administered 2015-06-28 (×2): via INTRAVENOUS

## 2015-06-28 MED ORDER — SCOPOLAMINE 1 MG/3DAYS TD PT72
1.0000 | MEDICATED_PATCH | Freq: Once | TRANSDERMAL | Status: DC
Start: 2015-06-28 — End: 2015-06-28
  Administered 2015-06-28: 1.5 mg via TRANSDERMAL

## 2015-06-28 MED ORDER — FENTANYL CITRATE (PF) 250 MCG/5ML IJ SOLN
INTRAMUSCULAR | Status: AC
  Filled 2015-06-28: qty 25

## 2015-06-28 MED ORDER — BUPIVACAINE HCL (PF) 0.25 % IJ SOLN
INTRAMUSCULAR | Status: AC
  Filled 2015-06-28: qty 30

## 2015-06-28 MED ORDER — ROCURONIUM BROMIDE 100 MG/10ML IV SOLN
INTRAVENOUS | Status: AC
  Filled 2015-06-28: qty 1

## 2015-06-28 MED ORDER — PROPOFOL 10 MG/ML IV BOLUS
INTRAVENOUS | Status: AC
  Filled 2015-06-28: qty 20

## 2015-06-28 MED ORDER — METHYLENE BLUE 1 % INJ SOLN
INTRAMUSCULAR | Status: AC
  Filled 2015-06-28: qty 1

## 2015-06-28 MED ORDER — FENTANYL CITRATE (PF) 100 MCG/2ML IJ SOLN
INTRAMUSCULAR | Status: AC
  Administered 2015-06-28: 25 ug via INTRAVENOUS
  Filled 2015-06-28: qty 2

## 2015-06-28 MED ORDER — PHENYLEPHRINE 40 MCG/ML (10ML) SYRINGE FOR IV PUSH (FOR BLOOD PRESSURE SUPPORT)
PREFILLED_SYRINGE | INTRAVENOUS | Status: AC
  Filled 2015-06-28: qty 10

## 2015-06-28 MED ORDER — FENTANYL CITRATE (PF) 100 MCG/2ML IJ SOLN
INTRAMUSCULAR | Status: DC | PRN
  Administered 2015-06-28: 50 ug via INTRAVENOUS
  Administered 2015-06-28: 100 ug via INTRAVENOUS
  Administered 2015-06-28: 25 ug via INTRAVENOUS

## 2015-06-28 MED ORDER — GLYCOPYRROLATE 0.2 MG/ML IJ SOLN
INTRAMUSCULAR | Status: DC | PRN
  Administered 2015-06-28: 0.6 mg via INTRAVENOUS

## 2015-06-28 MED ORDER — LACTATED RINGERS IR SOLN
Status: DC | PRN
  Administered 2015-06-28: 3000 mL

## 2015-06-28 MED ORDER — OXYCODONE-ACETAMINOPHEN 5-325 MG PO TABS
ORAL_TABLET | ORAL | Status: DC
Start: 2015-06-28 — End: 2015-06-28
  Filled 2015-06-28: qty 1

## 2015-06-28 MED ORDER — LIDOCAINE HCL (CARDIAC) 20 MG/ML IV SOLN
INTRAVENOUS | Status: AC
  Filled 2015-06-28: qty 5

## 2015-06-28 MED ORDER — ROCURONIUM BROMIDE 100 MG/10ML IV SOLN
INTRAVENOUS | Status: DC | PRN
  Administered 2015-06-28: 40 mg via INTRAVENOUS

## 2015-06-28 MED ORDER — ACETAMINOPHEN 160 MG/5ML PO SOLN
ORAL | Status: AC
  Administered 2015-06-28: 975 mg via ORAL
  Filled 2015-06-28: qty 40.6

## 2015-06-28 MED ORDER — ONDANSETRON HCL 4 MG/2ML IJ SOLN
INTRAMUSCULAR | Status: AC
  Filled 2015-06-28: qty 2

## 2015-06-28 MED ORDER — MIDAZOLAM HCL 2 MG/2ML IJ SOLN
INTRAMUSCULAR | Status: AC
  Filled 2015-06-28: qty 4

## 2015-06-28 MED ORDER — DEXAMETHASONE SODIUM PHOSPHATE 4 MG/ML IJ SOLN
INTRAMUSCULAR | Status: AC
  Filled 2015-06-28: qty 1

## 2015-06-28 MED ORDER — LIDOCAINE HCL (CARDIAC) 20 MG/ML IV SOLN
INTRAVENOUS | Status: DC | PRN
  Administered 2015-06-28: 100 mg via INTRAVENOUS

## 2015-06-28 MED ORDER — OXYCODONE-ACETAMINOPHEN 5-325 MG PO TABS
1.0000 | ORAL_TABLET | ORAL | Status: DC | PRN
  Administered 2015-06-28: 1 via ORAL

## 2015-06-28 MED ORDER — PROPOFOL 10 MG/ML IV BOLUS
INTRAVENOUS | Status: DC | PRN
  Administered 2015-06-28: 160 mg via INTRAVENOUS

## 2015-06-28 MED ORDER — GLYCOPYRROLATE 0.2 MG/ML IJ SOLN
INTRAMUSCULAR | Status: AC
  Filled 2015-06-28: qty 3

## 2015-06-28 MED ORDER — HEPARIN SODIUM (PORCINE) 5000 UNIT/ML IJ SOLN
INTRAMUSCULAR | Status: DC | PRN
  Administered 2015-06-28: 5000 [IU] via SUBCUTANEOUS

## 2015-06-28 MED ORDER — NEOSTIGMINE METHYLSULFATE 10 MG/10ML IV SOLN
INTRAVENOUS | Status: AC
  Filled 2015-06-28: qty 1

## 2015-06-28 MED ORDER — EPHEDRINE 5 MG/ML INJ
INTRAVENOUS | Status: AC
Start: 2015-06-28 — End: 2015-06-28
  Filled 2015-06-28: qty 10

## 2015-06-28 MED ORDER — ACETAMINOPHEN 160 MG/5ML PO SOLN
975.0000 mg | Freq: Once | ORAL | Status: AC
  Administered 2015-06-28: 975 mg via ORAL

## 2015-06-28 SURGICAL SUPPLY — 32 items
BAG SPEC RTRVL LRG 6X4 10 (ENDOMECHANICALS)
BAG URINE DRAINAGE (UROLOGICAL SUPPLIES) ×2 IMPLANT
BARRIER ADHS 3X4 INTERCEED (GAUZE/BANDAGES/DRESSINGS) IMPLANT
BRR ADH 4X3 ABS CNTRL BYND (GAUZE/BANDAGES/DRESSINGS)
CABLE HIGH FREQUENCY MONO STRZ (ELECTRODE) IMPLANT
CATH FOLEY 2WAY  3CC 10FR (CATHETERS) ×2
CATH FOLEY 2WAY 3CC 10FR (CATHETERS) IMPLANT
CLOTH BEACON ORANGE TIMEOUT ST (SAFETY) ×4 IMPLANT
COVER MAYO STAND STRL (DRAPES) ×2 IMPLANT
DRSG OPSITE POSTOP 3X4 (GAUZE/BANDAGES/DRESSINGS) ×2 IMPLANT
FILTER SMOKE EVAC LAPAROSHD (FILTER) ×4 IMPLANT
GLOVE BIOGEL PI IND STRL 8 (GLOVE) ×2 IMPLANT
GLOVE BIOGEL PI INDICATOR 8 (GLOVE) ×2
GLOVE ECLIPSE 7.5 STRL STRAW (GLOVE) ×8 IMPLANT
GOWN STRL REUS W/TWL LRG LVL3 (GOWN DISPOSABLE) ×8 IMPLANT
LIQUID BAND (GAUZE/BANDAGES/DRESSINGS) ×2 IMPLANT
NS IRRIG 1000ML POUR BTL (IV SOLUTION) ×4 IMPLANT
PACK LAPAROSCOPY BASIN (CUSTOM PROCEDURE TRAY) ×4 IMPLANT
POUCH SPECIMEN RETRIEVAL 10MM (ENDOMECHANICALS) IMPLANT
PROTECTOR NERVE ULNAR (MISCELLANEOUS) ×4 IMPLANT
SET IRRIG TUBING LAPAROSCOPIC (IRRIGATION / IRRIGATOR) ×4 IMPLANT
SHEARS HARMONIC ACE PLUS 36CM (ENDOMECHANICALS) ×2 IMPLANT
SLEEVE XCEL OPT CAN 5 100 (ENDOMECHANICALS) ×4 IMPLANT
SOLUTION ELECTROLUBE (MISCELLANEOUS) IMPLANT
SUT VIC AB 3-0 PS2 18 (SUTURE) ×4
SUT VIC AB 3-0 PS2 18XBRD (SUTURE) ×2 IMPLANT
SUT VICRYL 0 UR6 27IN ABS (SUTURE) ×6 IMPLANT
TOWEL OR 17X24 6PK STRL BLUE (TOWEL DISPOSABLE) ×8 IMPLANT
TRAY FOLEY CATH SILVER 14FR (SET/KITS/TRAYS/PACK) ×4 IMPLANT
TROCAR XCEL NON-BLD 11X100MML (ENDOMECHANICALS) ×4 IMPLANT
TROCAR XCEL NON-BLD 5MMX100MML (ENDOMECHANICALS) ×4 IMPLANT
WARMER LAPAROSCOPE (MISCELLANEOUS) ×4 IMPLANT

## 2015-06-28 NOTE — Anesthesia Procedure Notes (Signed)
Procedure Name: Intubation Date/Time: 06/28/2015 7:31 AM Performed by: Hewitt Blade Pre-anesthesia Checklist: Patient identified, Emergency Drugs available, Suction available and Patient being monitored Patient Re-evaluated:Patient Re-evaluated prior to inductionOxygen Delivery Method: Circle system utilized Preoxygenation: Pre-oxygenation with 100% oxygen Intubation Type: IV induction Ventilation: Mask ventilation without difficulty Laryngoscope Size: Mac and 3 Grade View: Grade I Tube type: Oral Tube size: 7.0 mm Number of attempts: 1 Airway Equipment and Method: Stylet Placement Confirmation: ETT inserted through vocal cords under direct vision,  positive ETCO2 and breath sounds checked- equal and bilateral Secured at: 21 cm Tube secured with: Tape Dental Injury: Teeth and Oropharynx as per pre-operative assessment

## 2015-06-28 NOTE — H&P (View-Only) (Signed)
 Madeline Hawkins is an 49 y.o. female. For preoperative examination. Patient scheduled at the end of the month for laparoscopic right salpingo-oophorectomy as a result of persistent ovarian cyst and chronic right lower quadrant pain.Review of her record indicated that back in 2013 she had a total abdominal hysterectomy for leiomyomatous uteri along with left ovarian cystectomy which was a hemorrhagic cyst. An ultrasound was done here in the office on 04/02/2014 demonstrated the following:  Absent uterus. Vaginal cuff normal. A left ovarian corpus luteum cyst measuring 23 x 21 mm was noted along with a right ovarian ankle free thinwall avascular cyst with low level echoes measuring 36 x 44 mm. Small amount of free fluid was seen the cul-de-sac.  she was given a shot of Depo-Provera 150 mg IM and was to return back to the office in 3 months for follow-up ultrasound. Patient also had a normal C1 25 with a value of 7.4.  Her primary had done an ultrasound on July 12 at a different facility and the report was as follows: findings : The uterus is surgically absent. There is a 2.9 x 4.4 x 2.7 cm right ovarian cyst. The left ovary was not seen. No free fluid was noted in the pelvic cavity.  She once again received a shot of Depo-Provera and the ultrasound today demonstrated the following:  Absent uterus. Right ovary with thinwall cyst measuring 31 x 25 x 29 mm every size 2.5 cm slightly decreased in size from previous scan 2015 2016. Avascular with diffuse internal low level echoes arterial blood flow was seen to the right ovary. Left ovary was otherwise normal and no fluid in the cul-de-sac.  Patient with persistent right lower quadrant pain was to proceed with laparoscopic removal of right tube and ovary.   Pertinent Gynecological History: Menses: none Bleeding: none Contraception: none DES exposure: unknown Blood transfusions: none Sexually transmitted diseases: no past history Previous GYN  Procedures: 4 nsvd, tubal ligation, TAH, l. ovarian cysatectomy  Last mammogram: normal Date: 2016 Last pap: normal Date: 2013 OB History: G4, P4  Menstrual History: Menarche age: 18  Patient's last menstrual period was 11/10/2011.    Past Medical History  Diagnosis Date  . CIN I (cervical intraepithelial neoplasia I) 1989    cryo...also in 2008 .. had cryo   . Hypertension   . Anxiety     Past Surgical History  Procedure Laterality Date  . Salpingectomy      left for ectopic pregnancy  . Gynecologic cryosurgery  07/07/2007  . Tubal ligation      right tubal ligation  . Laparoscopy  11/22/2011    Procedure: LAPAROSCOPY DIAGNOSTIC;  Surgeon: Jakai Onofre Hawkins Brihany Butch, MD;  Location: [facility];  Service: Gynecology;  Laterality: N/A;  . Abdominal hysterectomy  11/22/2011    Procedure: HYSTERECTOMY ABDOMINAL;  Surgeon: Kamelia Lampkins Hawkins Madyx Delfin, MD;  Location: [facility];  Service: Gynecology;  Laterality: N/A;  . Ovarian cyst removal  11/22/2011    Procedure: OVARIAN CYSTECTOMY;  Surgeon: Saivon Prowse Hawkins Taysen Bushart, MD;  Location: [facility];  Service: Gynecology;  Laterality: Left;  . Colon polyps removed      Family History  Problem Relation Age of Onset  . Diabetes Brother   . Ovarian cancer Mother   . Ovarian cancer Paternal Aunt   . Heart disease Maternal Grandmother   . Breast cancer Maternal Grandmother     Age 40's  . Breast cancer Maternal Aunt     Age 30's    Social History:    reports that she has never smoked. She has never used smokeless tobacco. She reports that she does not drink alcohol or use illicit drugs.  Allergies: No Known Allergies   (Not in a hospital admission)  REVIEW OF SYSTEMS: A ROS was performed and pertinent positives and negatives are included in the history.  GENERAL: No fevers or chills. HEENT: No change in vision, no earache, sore throat or sinus congestion. NECK: No pain or stiffness. CARDIOVASCULAR: No chest pain or pressure. No palpitations. PULMONARY: No shortness of  breath, cough or wheeze. GASTROINTESTINAL: No abdominal pain, nausea, vomiting or diarrhea, melena or bright red blood per rectum. GENITOURINARY: No urinary frequency, urgency, hesitancy or dysuria. MUSCULOSKELETAL: No joint or muscle pain, no back pain, no recent trauma. DERMATOLOGIC: No rash, no itching, no lesions. ENDOCRINE: No polyuria, polydipsia, no heat or cold intolerance. No recent change in weight. HEMATOLOGICAL: No anemia or easy bruising or bleeding. NEUROLOGIC: No headache, seizures, numbness, tingling or weakness. PSYCHIATRIC: No depression, no loss of interest in normal activity or change in sleep pattern.     Blood pressure 130/82, last menstrual period 11/10/2011.  Physical Exam:  HEENT:unremarkable Neck:Supple, midline, no thyroid megaly, no carotid bruits Lungs:  Clear to auscultation no rhonchi's or wheezes Heart:Regular rate and rhythm, no murmurs or gallops Breast Exam: Both breasts are symmetrical in appearance no palpable masses or tenderness no supraclavicular axillary lymphadenopathy Abdomen: Soft nontender no rebound or guarding Pelvic:BUS within normal limits Vagina: No lesions or discharge Cervix: Absent Uterus: Absent Adnexa: Tenderness in right lower quadrant Extremities: No cords, no edema Rectal: Unremarkable  No results found for this or any previous visit (from the past 24 hour(s)).  No results found.  Assessment/Plan: Patient with persistent right lower quadrant pain and persistent right ovarian cyst despite Depo-Provera injection 2. Patient scheduled for laparoscopic right salpingo-oophorectomy and lysis of pelvic adhesions at the present. Surgical risks for surgery discussed as follows:                        Patient was counseled as to the risk of surgery to include the following:  1. Infection (prohylactic antibiotics will be administered)  2. DVT/Pulmonary Embolism (prophylactic pneumo compression stockings will be used)  3.Trauma to  internal organs requiring additional surgical procedure to repair any injury to     Internal organs requiring perhaps additional hospitalization days.  4.Hemmorhage requiring transfusion and blood products which carry risks such as             anaphylactic reaction, hepatitis and AIDS  Patient had received literature information on the procedure scheduled and all her questions were answered and fully accepts all risk.   Madeline Fee HMD11:55 AMTD@Note:    Lionell Matuszak Hawkins 06/22/2015, 9:42 AM  Note: This dictation was prepared with  Dragon/digital dictation along withSmart phrase technology. Any transcriptional errors that result from this process are unintentional.  

## 2015-06-28 NOTE — Transfer of Care (Signed)
Immediate Anesthesia Transfer of Care Note  Patient: Madeline Hawkins  Procedure(s) Performed: Procedure(s): LAPAROSCOPIC RIGHT SALPINGO OOPHORECTOMY (Right) LAPAROSCOPIC LYSIS OF ABDOMINAL PELVIC ADHESIONS  Patient Location: PACU  Anesthesia Type:General  Level of Consciousness: awake, alert  and oriented  Airway & Oxygen Therapy: Patient Spontanous Breathing and Patient connected to nasal cannula oxygen  Post-op Assessment: Report given to RN, Post -op Vital signs reviewed and stable and Patient moving all extremities  Post vital signs: Reviewed and stable  Last Vitals:  Filed Vitals:   06/28/15 0617  BP: 109/77  Pulse: 82  Temp: 36.6 C  Resp: 16    Complications: No apparent anesthesia complications

## 2015-06-28 NOTE — Anesthesia Postprocedure Evaluation (Signed)
  Anesthesia Post-op Note  Patient: Madeline Hawkins  Procedure(s) Performed: Procedure(s): LAPAROSCOPIC RIGHT SALPINGO OOPHORECTOMY (Right) LAPAROSCOPIC LYSIS OF ABDOMINAL PELVIC ADHESIONS Patient is awake and responsive. Pain and nausea are reasonably well controlled. Vital signs are stable and clinically acceptable. Oxygen saturation is clinically acceptable. There are no apparent anesthetic complications at this time. Patient is ready for discharge.

## 2015-06-28 NOTE — Op Note (Signed)
Operative Note  06/28/2015  8:47 AM  PATIENT:  Madeline Hawkins  49 y.o. female  PRE-OPERATIVE DIAGNOSIS:  right ovarian cyst , pelvic pain  POST-OPERATIVE DIAGNOSIS:  right ovarian cyst , pelvic pain  PROCEDURE:  Procedure(s): LAPAROSCOPIC RIGHT SALPINGO OOPHORECTOMY LAPAROSCOPIC LYSIS OF ABDOMINAL PELVIC ADHESIONS  SURGEON:  Surgeon(s): Terrance Mass, MD Anastasio Auerbach, MD  ANESTHESIA:   general  FINDINGS: Anterior abdominal wall adhesions. A large right ovary multicystic smooth surface approximately 3 x 4 cm adhered to the right pelvic sidewall. Cul-de-sac was clear of adhesions or any endometriosis. Left ovary was normal absent left tube. Normal-appearing appendix. Smooth liver surface  DESCRIPTION OF OPERATION: The patient was taken to the operating room were she underwent successful general endotracheal anesthesia. An NG tube was placed to decompress the stomach as a result of the left upper quadrant planned entry with a 5 mm trocar because of patient's multiple surgeries. Patient received 2 g Cefotan for prophylaxis. She had PAS stockings for DVT prophylaxis. A timeout was undertaken to properly identify the patient and voice allowed the procedure to be undertaken. The abdomen vagina and perineum were prepped and draped in usual sterile fashion. A Foley catheter was placed to monitor urinary output. After the trace when placed a 5 mm trocar was inserted 3 finger breaths mid level to the left costal margin. Entry was successful under direct visualization with a 5 mm Optiview trocar. A pneumoperitoneum was established. The camera was attached and it was noted that patient had omental adhesions to the anterior abdominal wall. A second puncture site with a 5 mm trocar was made at the patient's right lower abdomen. With the use of the Harmonic Scalpel the omental adhesion from the anterior abdominal wall was freed by coaptated he and transecting it free. Once this was accomplished  a 10 mm trocar was inserted into the mid umbilicus incision to better visualize the pelvic cavity. It was noted this time patient had a multicystic 3 x 4 cm left ovary adhered to left pelvic sidewall. The contralateral ovary was normal. There was no abnormality in the cul-de-sac. The appendix appeared to be normal. The liver had a smooth surface noted. Attention was then placed to the right adnexa. The right ureter was identified medial to the infundibulopelvic ligament which was placed under tension and with the Harmonic scalpel was coaptated and transected. The dissection along with the co-optation transection was carried out along the right pelvic sidewall thus freeing the right tube and ovary. It was noted that chocolate material extruded from the ovary may get highly suspicious for an endometrioma. The laparoscopic Endopouch was introduced through the 10/11 mm incision port with the 5 mm camera attachment to the right lower abdomen and the specimen was placed in the pouch and retrieved through the umbilicus incision and submitted for histological evaluation. Of note prior to commencement of the operation pelvic washings had been obtained. The 10/11 mm trocar with camera attachment was then placed back through the umbilicus incision and a systematic inspection of the entire pelvic cavity demonstrated adequate hemostasis after irrigating the entire cavity. The pneumoperitoneum was removed all the trochars were removed. The subumbilical fascia was closed with a figure-of-eight of 0 oral suture the subcutaneous tissue was reapproximated with 30 Vicryls suture. All 3 port site skin incisions were reapproximated with Dermabond glue. Prior to this 0.25% Marcaine was infiltrated all 3 incision sites for a total 20 cc for postoperative analgesia. The patient was extubated and transferred  to recovery room stable vital signs. She received 30 mg of Toradol in route to the recovery room.  ESTIMATED BLOOD LOSS: Minimal   Intake/Output Summary (Last 24 hours) at 06/28/15 0847 Last data filed at 06/28/15 0829  Gross per 24 hour  Intake   1500 ml  Output    260 ml  Net   1240 ml     BLOOD ADMINISTERED:none   LOCAL MEDICATIONS USED:  MARCAINE   0.25 % SQ 20 cc's  SPECIMEN:  Source of Specimen:  Right tube and ovary  DISPOSITION OF SPECIMEN:  PATHOLOGY  COUNTS:  YES  PLAN OF CARE: Transfer to PACU  [facility] HMD8:47 AMTD@

## 2015-06-28 NOTE — Discharge Instructions (Signed)
DISCHARGE INSTRUCTIONS: Laparoscopy  The following instructions have been prepared to help you care for yourself upon your return home today.  MAY REMOVE SCOP PATCH ON OR BEFORE 06/30/2015  MAY TAKE IBUPROFEN AFTER 2:30 PM AS NEEDED FOR PAIN  Wound care:  Do not get the incision wet for the first 24 hours. The incision should be kept clean and dry.  The Band-Aids or dressings may be removed the day after surgery.  Should the incision become sore, red, and swollen after the first week, check with your doctor.  Personal hygiene:  Shower the day after your procedure.  Activity and limitations:  Do NOT drive or operate any equipment today.  Do NOT lift anything more than 15 pounds for 2-3 weeks after surgery.  Do NOT rest in bed all day.  Walking is encouraged. Walk each day, starting slowly with 5-minute walks 3 or 4 times a day. Slowly increase the length of your walks.  Walk up and down stairs slowly.  Do NOT do strenuous activities, such as golfing, playing tennis, bowling, running, biking, weight lifting, gardening, mowing, or vacuuming for 2-4 weeks. Ask your doctor when it is okay to start.  Diet: Eat a light meal as desired this evening. You may resume your usual diet tomorrow.  Return to work: This is dependent on the type of work you do. For the most part you can return to a desk job within a week of surgery. If you are more active at work, please discuss this with your doctor.  What to expect after your surgery: You may have a slight burning sensation when you urinate on the first day. You may have a very small amount of blood in the urine. Expect to have a small amount of vaginal discharge/light bleeding for 1-2 weeks. It is not unusual to have abdominal soreness and bruising for up to 2 weeks. You may be tired and need more rest for about 1 week. You may experience shoulder pain for 24-72 hours. Lying flat in bed may relieve it.  Call your doctor for any of the  following:  Develop a fever of 100.4 or greater  Inability to urinate 6 hours after discharge from hospital  Severe pain not relieved by pain medications  Persistent of heavy bleeding at incision site  Redness or swelling around incision site after a week  Increasing nausea or vomiting

## 2015-06-28 NOTE — Interval H&P Note (Signed)
History and Physical Interval Note:  06/28/2015 7:17 AM  Madeline Hawkins  has presented today for surgery, with the diagnosis of right ovarian cyst , pelvic pain  The various methods of treatment have been discussed with the patient and family. After consideration of risks, benefits and other options for treatment, the patient has consented to  Procedure(s): LAPAROSCOPIC UNILATERAL SALPINGO OOPHORECTOMY (Right) as a surgical intervention .  The patient's history has been reviewed, patient examined, no change in status, stable for surgery.  I have reviewed the patient's chart and labs.  Questions were answered to the patient's satisfaction.     Terrance Mass

## 2015-06-29 ENCOUNTER — Encounter (HOSPITAL_COMMUNITY): Payer: Self-pay | Admitting: Gynecology

## 2015-06-29 MED FILL — Heparin Sodium (Porcine) Inj 5000 Unit/ML: INTRAMUSCULAR | Qty: 1 | Status: AC

## 2015-07-14 ENCOUNTER — Encounter: Payer: Self-pay | Admitting: Gynecology

## 2015-07-14 ENCOUNTER — Ambulatory Visit (INDEPENDENT_AMBULATORY_CARE_PROVIDER_SITE_OTHER): Payer: BLUE CROSS/BLUE SHIELD | Admitting: Gynecology

## 2015-07-14 VITALS — BP 124/80

## 2015-07-14 DIAGNOSIS — Z09 Encounter for follow-up examination after completed treatment for conditions other than malignant neoplasm: Secondary | ICD-10-CM

## 2015-07-14 NOTE — Progress Notes (Signed)
   Patient is a 49 year old who presented to the office for her two-week postop visit. On 06/28/2015 patient underwent laparoscopic right salpingo-oophorectomy along with lysis of abdominal pelvic adhesions as a result of persistent right ovarian cyst and pelvic pain. Findings, pathology report and pictures were shared with the patient as follows:  FINDINGS: Anterior abdominal wall adhesions. A large right ovary multicystic smooth surface approximately 3 x 4 cm adhered to the right pelvic sidewall. Cul-de-sac was clear of adhesions or any endometriosis. Left ovary was normal absent left tube. Normal-appearing appendix. Smooth liver surface  Pathology report: Diagnosis Ovary and fallopian tube, right - BENIGN SEROUS CYSTADENOMA. - CYSTIC FOLLICLES AND HEMORRHAGIC CORPUS LUTEUM. - BENIGN FALLOPIAN TUBE. - NO ENDOMETRIOSIS OR MALIGNANCY IDENTIFIED.  Patient is doing well otherwise no GU or GI complaints. Some mild incisional discomfort otherwise doing well and not using narcotic for pain relief.  Exam: Blood pressure 124/80 Gen. appearance well-developed on nursing no acute distress Abdomen: Soft nontender no rebound or guarding laparoscopic incision port sites healing well Dermabond glue still present Pelvic: Bartholin urethra Skene was within normal limits Vagina no lesions or discharge Vaginal cuff no lesions or discharge Bimanual exam unremarkable Rectal exam: Not done  Assessment/plan: Patient 2 weeks status post laparoscopic right salpingo-oophorectomy along with lysis of pelvic adhesions a result of chronic pelvic pain and persistent right ovarian cyst. Benign pathology. Patient has started to resume normal activity below return back to the office in 3 weeks for final postop visit before she returns back to work.

## 2015-08-05 ENCOUNTER — Encounter: Payer: Self-pay | Admitting: Gynecology

## 2015-08-05 ENCOUNTER — Ambulatory Visit (INDEPENDENT_AMBULATORY_CARE_PROVIDER_SITE_OTHER): Payer: BLUE CROSS/BLUE SHIELD | Admitting: Gynecology

## 2015-08-05 VITALS — BP 120/70

## 2015-08-05 DIAGNOSIS — Z09 Encounter for follow-up examination after completed treatment for conditions other than malignant neoplasm: Secondary | ICD-10-CM

## 2015-08-05 DIAGNOSIS — F4321 Adjustment disorder with depressed mood: Secondary | ICD-10-CM | POA: Insufficient documentation

## 2015-08-05 HISTORY — DX: Adjustment disorder with depressed mood: F43.21

## 2015-08-05 MED ORDER — ALPRAZOLAM 0.25 MG PO TABS
0.2500 mg | ORAL_TABLET | Freq: Every evening | ORAL | Status: DC | PRN
Start: 2015-08-05 — End: 2016-02-28

## 2015-08-05 NOTE — Patient Instructions (Signed)
Alprazolam tablets What is this medicine? ALPRAZOLAM (al PRAY zoe lam) is a benzodiazepine. It is used to treat anxiety and panic attacks. This medicine may be used for other purposes; ask your health care provider or pharmacist if you have questions. What should I tell my health care provider before I take this medicine? They need to know if you have any of these conditions: -an alcohol or drug abuse problem -bipolar disorder, depression, psychosis or other mental health conditions -glaucoma -kidney or liver disease -lung or breathing disease -myasthenia gravis -Parkinson's disease -porphyria -seizures or a history of seizures -suicidal thoughts -an unusual or allergic reaction to alprazolam, other benzodiazepines, foods, dyes, or preservatives -pregnant or trying to get pregnant -breast-feeding How should I use this medicine? Take this medicine by mouth with a glass of water. Follow the directions on the prescription label. Take your medicine at regular intervals. Do not take it more often than directed. If you have been taking this medicine regularly for some time, do not suddenly stop taking it. You must gradually reduce the dose or you may get severe side effects. Ask your doctor or health care professional for advice. Even after you stop taking this medicine it can still affect your body for several days. Talk to your pediatrician regarding the use of this medicine in children. Special care may be needed. Overdosage: If you think you have taken too much of this medicine contact a poison control center or emergency room at once. NOTE: This medicine is only for you. Do not share this medicine with others. What if I miss a dose? If you miss a dose, take it as soon as you can. If it is almost time for your next dose, take only that dose. Do not take double or extra doses. What may interact with this medicine? Do not take this medicine with any of the following medications: -certain  medicines for HIV infection or AIDS -ketoconazole -itraconazole This medicine may also interact with the following medications: -birth control pills -certain macrolide antibiotics like clarithromycin, erythromycin, troleandomycin -cimetidine -cyclosporine -ergotamine -grapefruit juice -herbal or dietary supplements like kava kava, melatonin, dehydroepiandrosterone, DHEA, St. John's Wort or valerian -imatinib, STI-571 -isoniazid -levodopa -medicines for depression, anxiety, or psychotic disturbances -prescription pain medicines -rifampin, rifapentine, or rifabutin -some medicines for blood pressure or heart problems -some medicines for seizures like carbamazepine, oxcarbazepine, phenobarbital, phenytoin, primidone This list may not describe all possible interactions. Give your health care provider a list of all the medicines, herbs, non-prescription drugs, or dietary supplements you use. Also tell them if you smoke, drink alcohol, or use illegal drugs. Some items may interact with your medicine. What should I watch for while using this medicine? Visit your doctor or health care professional for regular checks on your progress. Your body can become dependent on this medicine. Ask your doctor or health care professional if you still need to take it. You may get drowsy or dizzy. Do not drive, use machinery, or do anything that needs mental alertness until you know how this medicine affects you. To reduce the risk of dizzy and fainting spells, do not stand or sit up quickly, especially if you are an older patient. Alcohol may increase dizziness and drowsiness. Avoid alcoholic drinks. Do not treat yourself for coughs, colds or allergies without asking your doctor or health care professional for advice. Some ingredients can increase possible side effects. What side effects may I notice from receiving this medicine? Side effects that you should report to your   doctor or health care professional as  soon as possible: -allergic reactions like skin rash, itching or hives, swelling of the face, lips, or tongue -confusion, forgetfulness -depression -difficulty sleeping -difficulty speaking -feeling faint or lightheaded, falls -mood changes, excitability or aggressive behavior -muscle cramps -trouble passing urine or change in the amount of urine -unusually weak or tired Side effects that usually do not require medical attention (report to your doctor or health care professional if they continue or are bothersome): -change in sex drive or performance -changes in appetite This list may not describe all possible side effects. Call your doctor for medical advice about side effects. You may report side effects to FDA at 1-800-FDA-1088. Where should I keep my medicine? Keep out of the reach of children. This medicine can be abused. Keep your medicine in a safe place to protect it from theft. Do not share this medicine with anyone. Selling or giving away this medicine is dangerous and against the law. Store at room temperature between 20 and 25 degrees C (68 and 77 degrees F). This medicine may cause accidental overdose and death if taken by other adults, children, or pets. Mix any unused medicine with a substance like cat litter or coffee grounds. Then throw the medicine away in a sealed container like a sealed bag or a coffee can with a lid. Do not use the medicine after the expiration date. NOTE: This sheet is a summary. It may not cover all possible information. If you have questions about this medicine, talk to your doctor, pharmacist, or health care provider.    2016, Elsevier/Gold Standard. (2014-07-06 14:51:36) Generalized Anxiety Disorder Generalized anxiety disorder (GAD) is a mental disorder. It interferes with life functions, including relationships, work, and school. GAD is different from normal anxiety, which everyone experiences at some point in their lives in response to specific  life events and activities. Normal anxiety actually helps [place] prepare for and get through these life events and activities. Normal anxiety goes away after the event or activity is over.  GAD causes anxiety that is not necessarily related to specific events or activities. It also causes excess anxiety in proportion to specific events or activities. The anxiety associated with GAD is also difficult to control. GAD can vary from mild to severe. People with severe GAD can have intense waves of anxiety with physical symptoms (panic attacks).  SYMPTOMS The anxiety and worry associated with GAD are difficult to control. This anxiety and worry are related to many life events and activities and also occur more days than not for 6 months or longer. People with GAD also have three or more of the following symptoms (one or more in children):  Restlessness.   Fatigue.  Difficulty concentrating.   Irritability.  Muscle tension.  Difficulty sleeping or unsatisfying sleep. DIAGNOSIS GAD is diagnosed through an assessment by your health care provider. Your health care provider will ask you questions aboutyour mood,physical symptoms, and events in your life. Your health care provider may ask you about your medical history and use of alcohol or drugs, including prescription medicines. Your health care provider may also do a physical exam and blood tests. Certain medical conditions and the use of certain substances can cause symptoms similar to those associated with GAD. Your health care provider may refer you to a mental health specialist for further evaluation. TREATMENT The following therapies are usually used to treat GAD:   Medication. Antidepressant medication usually is prescribed for long-term daily control. Antianxiety medicines may be  added in severe cases, especially when panic attacks occur.   Talk therapy (psychotherapy). Certain types of talk therapy can be helpful in treating GAD by providing  support, education, and guidance. A form of talk therapy called cognitive behavioral therapy can teach you healthy ways to think about and react to daily life events and activities.  Stress managementtechniques. These include yoga, meditation, and exercise and can be very helpful when they are practiced regularly. A mental health specialist can help determine which treatment is best for you. Some people see improvement with one therapy. However, other people require a combination of therapies.   This information is not intended to replace advice given to you by your health care provider. Make sure you discuss any questions you have with your health care provider.   Document Released: 02/09/2013 Document Revised: 11/05/2014 Document Reviewed: 02/09/2013 Elsevier Interactive Patient Education [employer].

## 2015-08-05 NOTE — Progress Notes (Signed)
   Patient presented to the office today for her six-week postop appointment.On 06/28/2015 patient underwent laparoscopic right salpingo-oophorectomy along with lysis of abdominal pelvic adhesions as a result of persistent right ovarian cyst and pelvic pain. Findings, pathology report and pictures were shared with the patient as follows:  FINDINGS: Anterior abdominal wall adhesions. A large right ovary multicystic smooth surface approximately 3 x 4 cm adhered to the right pelvic sidewall. Cul-de-sac was clear of adhesions or any endometriosis. Left ovary was normal absent left tube. Normal-appearing appendix. Smooth liver surface  Pathology report: Diagnosis Ovary and fallopian tube, right - BENIGN SEROUS CYSTADENOMA. - CYSTIC FOLLICLES AND HEMORRHAGIC CORPUS LUTEUM. - BENIGN FALLOPIAN TUBE. - NO ENDOMETRIOSIS OR MALIGNANCY IDENTIFIED.  Patient is doing well otherwise no GU or GI complaints. Her main complaint is one of feeling anxious in the past she has taken Xanax 0.25 mg on a when necessary basis and would like to have a refill.  Exam: Blood pressure 120/70 Gen. appearance well-developed on nursing no acute distress Abdomen: Soft nontender no rebound or guarding laparoscopic incision port sites completely healed  Pelvic: Bartholin urethra Skene was within normal limits Vagina no lesions or discharge Vaginal cuff no lesions or discharge Bimanual exam unremarkable Rectal exam: Not done  Assessment/plan: Patient 6 weeks status post laparoscopic right salpingo-oophorectomy along with lysis of pelvic adhesions a result of chronic pelvic pain and persistent right ovarian cyst. Benign pathology. Patient may resume full normal activity return to the office next year for her annual exam. For anxiety she was prescribed Xanax 0.25 mg to take 1 by mouth daily on a when necessary basis. Patient declined flu vaccine today.

## 2015-11-22 ENCOUNTER — Telehealth: Payer: Self-pay | Admitting: *Deleted

## 2015-11-22 DIAGNOSIS — M25559 Pain in unspecified hip: Secondary | ICD-10-CM

## 2015-11-22 NOTE — Telephone Encounter (Signed)
I left message for pt to call, Jefferson Fuel, NP is no longer at [facility], I called pt to get number so I could get office notes faxed to our office regarding this.

## 2015-11-22 NOTE — Telephone Encounter (Signed)
-----   Message from Sinclair Grooms sent at 11/21/2015  4:03 PM EST ----- Regarding: nurse call Patient went to see Dr Jefferson Fuel her primary doctor here in [place] and she was informed that it look like she had another cyst and want JF to do another ultrasound.  I informed patient we did not receive any records but patient wants [place] to ask JF if she can do another ultrasound.

## 2015-11-22 NOTE — Telephone Encounter (Signed)
Pt called back and gave me the new office # [phone], I called and left a message for the nurse to call

## 2015-11-24 NOTE — Telephone Encounter (Signed)
Tammy from NP [facility] office faxed office notes regarding the below, I got a verbal okay from [place] for ultrasound upon review of office notes. Appointment will contact pt to schedule.

## 2015-11-30 ENCOUNTER — Ambulatory Visit (INDEPENDENT_AMBULATORY_CARE_PROVIDER_SITE_OTHER): Payer: BLUE CROSS/BLUE SHIELD

## 2015-11-30 ENCOUNTER — Ambulatory Visit (INDEPENDENT_AMBULATORY_CARE_PROVIDER_SITE_OTHER): Payer: BLUE CROSS/BLUE SHIELD | Admitting: Gynecology

## 2015-11-30 ENCOUNTER — Other Ambulatory Visit: Payer: Self-pay | Admitting: Gynecology

## 2015-11-30 ENCOUNTER — Encounter: Payer: Self-pay | Admitting: Gynecology

## 2015-11-30 VITALS — BP 124/78 | Ht 64.0 in | Wt 191.0 lb

## 2015-11-30 DIAGNOSIS — M25559 Pain in unspecified hip: Secondary | ICD-10-CM | POA: Diagnosis not present

## 2015-11-30 DIAGNOSIS — R102 Pelvic and perineal pain: Secondary | ICD-10-CM | POA: Diagnosis not present

## 2015-11-30 DIAGNOSIS — N83202 Unspecified ovarian cyst, left side: Secondary | ICD-10-CM

## 2015-11-30 DIAGNOSIS — N83201 Unspecified ovarian cyst, right side: Secondary | ICD-10-CM | POA: Insufficient documentation

## 2015-11-30 LAB — CBC WITH DIFFERENTIAL/PLATELET
BASOS ABS: 0.1 10*3/uL (ref 0.0–0.1)
BASOS PCT: 1 % (ref 0–1)
EOS ABS: 0.1 10*3/uL (ref 0.0–0.7)
Eosinophils Relative: 1 % (ref 0–5)
HCT: 39.2 % (ref 36.0–46.0)
Hemoglobin: 12.8 g/dL (ref 12.0–15.0)
Lymphocytes Relative: 38 % (ref 12–46)
Lymphs Abs: 2.4 10*3/uL (ref 0.7–4.0)
MCH: 28 pg (ref 26.0–34.0)
MCHC: 32.7 g/dL (ref 30.0–36.0)
MCV: 85.8 fL (ref 78.0–100.0)
MPV: 8.8 fL (ref 8.6–12.4)
Monocytes Absolute: 0.5 10*3/uL (ref 0.1–1.0)
Monocytes Relative: 8 % (ref 3–12)
NEUTROS PCT: 52 % (ref 43–77)
Neutro Abs: 3.3 10*3/uL (ref 1.7–7.7)
PLATELETS: 216 10*3/uL (ref 150–400)
RBC: 4.57 MIL/uL (ref 3.87–5.11)
RDW: 15.5 % (ref 11.5–15.5)
WBC: 6.4 10*3/uL (ref 4.0–10.5)

## 2015-11-30 MED ORDER — KETOROLAC TROMETHAMINE 10 MG PO TABS: 10.0000 mg | ORAL_TABLET | Freq: Four times a day (QID) | ORAL | Status: DC | PRN

## 2015-11-30 MED ORDER — KETOROLAC TROMETHAMINE 30 MG/ML IJ SOLN
60.0000 mg | Freq: Once | INTRAMUSCULAR | Status: AC
  Administered 2015-11-30: 60 mg via INTRAVENOUS

## 2015-11-30 MED ORDER — MEDROXYPROGESTERONE ACETATE 150 MG/ML IM SUSP
150.0000 mg | Freq: Once | INTRAMUSCULAR | Status: AC
  Administered 2015-11-30: 150 mg via INTRAMUSCULAR

## 2015-11-30 NOTE — Progress Notes (Signed)
   Patient is a 50 year old who presented to the office today stating that for the past few weeks had been complaining of left lower abdominal quadrant stabbing sensation. Patient denied any nausea vomiting fever, chills or any vaginal discharge. No GU or GI complaints. Review of her record indicated she was seen in the office for her postop visit on 08/05/2015.  On 06/28/2015 patient underwent laparoscopic right salpingo-oophorectomy along with lysis of abdominal pelvic adhesions as a result of persistent right ovarian cyst and pelvic pain. Findings, pathology report and pictures were shared with the patient as follows:  FINDINGS: Anterior abdominal wall adhesions. A large right ovary multicystic smooth surface approximately 3 x 4 cm adhered to the right pelvic sidewall. Cul-de-sac was clear of adhesions or any endometriosis. Left ovary was normal absent left tube. Normal-appearing appendix. Smooth liver surface  Pathology report: Diagnosis Ovary and fallopian tube, right - BENIGN SEROUS CYSTADENOMA. - CYSTIC FOLLICLES AND HEMORRHAGIC CORPUS LUTEUM. - BENIGN FALLOPIAN TUBE. - NO ENDOMETRIOSIS OR MALIGNANCY IDENTIFIED.  Exam: Gen. appearance well-developed well-nourished female complaining of right lower quadrant discomfort. 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 I left ovarian thinwall echo-free avascular cyst measuring 3.6 x 3.5 x 3.4 cm average size 3.5 Center meter. Left cul-de-sac and surrounding area of left ovary free fluid 4.3 x 3.6 x 1.8 cm. Right adnexa was negative.  Assessment/plan: Patient with prior history of hysterectomy and right salpingo-oophorectomy  past few months complaining of left lower quadrant discomfort appear she has a hemorrhagic cyst has been collapsing. She will be giving a shot of Depo-Provera 150 mg IM today and return 3 months for follow-up ultrasound. We are also going to give her Toradol 60 mg IM today and a prescription for Toradol 10 mg to take 1 by mouth every 6 hours when necessary for the next 5 days. To cover all basis although there maybe limitations were still 1 to get a CA 125 today as well.

## 2015-11-30 NOTE — Addendum Note (Signed)
Addended by: Burnett Kanaris on: 11/30/2015 02:08 PM   Modules accepted: Orders

## 2015-11-30 NOTE — Patient Instructions (Addendum)
Ovarian Cyst An ovarian cyst is a fluid-filled sac that forms on an ovary. The ovaries are small organs that produce eggs in women. Various types of cysts can form on the ovaries. Most are not cancerous. Many do not cause problems, and they often go away on their own. Some may cause symptoms and require treatment. Common types of ovarian cysts include:  Functional cysts--These cysts may occur every month during the menstrual cycle. This is normal. The cysts usually go away with the next menstrual cycle if the woman does not get pregnant. Usually, there are no symptoms with a functional cyst.  Endometrioma cysts--These cysts form from the tissue that lines the uterus. They are also called "chocolate cysts" because they become filled with blood that turns brown. This type of cyst can cause pain in the lower abdomen during intercourse and with your menstrual period.  Cystadenoma cysts--This type develops from the cells on the outside of the ovary. These cysts can get very big and cause lower abdomen pain and pain with intercourse. This type of cyst can twist on itself, cut off its blood supply, and cause severe pain. It can also easily rupture and cause a lot of pain.  Dermoid cysts--This type of cyst is sometimes found in both ovaries. These cysts may contain different kinds of body tissue, such as skin, teeth, hair, or cartilage. They usually do not cause symptoms unless they get very big.  Theca lutein cysts--These cysts occur when too much of a certain hormone (human chorionic gonadotropin) is produced and overstimulates the ovaries to produce an egg. This is most common after procedures used to assist with the conception of a baby (in vitro fertilization). CAUSES   Fertility drugs can cause a condition in which multiple large cysts are formed on the ovaries. This is called ovarian hyperstimulation syndrome.  A condition called polycystic ovary syndrome can cause hormonal imbalances that can lead to  nonfunctional ovarian cysts. SIGNS AND SYMPTOMS  Many ovarian cysts do not cause symptoms. If symptoms are present, they may include:  Pelvic pain or pressure.  Pain in the lower abdomen.  Pain during sexual intercourse.  Increasing girth (swelling) of the abdomen.  Abnormal menstrual periods.  Increasing pain with menstrual periods.  Stopping having menstrual periods without being pregnant. DIAGNOSIS  These cysts are commonly found during a routine or annual pelvic exam. Tests may be ordered to find out more about the cyst. These tests may include:  Ultrasound.  X-ray of the pelvis.  CT scan.  MRI.  Blood tests. TREATMENT  Many ovarian cysts go away on their own without treatment. Your health care provider may want to check your cyst regularly for 2-3 months to see if it changes. For women in menopause, it is particularly important to monitor a cyst closely because of the higher rate of ovarian cancer in menopausal women. When treatment is needed, it may include any of the following:  A procedure to drain the cyst (aspiration). This may be done using a long needle and ultrasound. It can also be done through a laparoscopic procedure. This involves using a thin, lighted tube with a tiny camera on the end (laparoscope) inserted through a small incision.  Surgery to remove the whole cyst. This may be done using laparoscopic surgery or an open surgery involving a larger incision in the lower abdomen.  Hormone treatment or birth control pills. These methods are sometimes used to help dissolve a cyst. HOME CARE INSTRUCTIONS   Only take over-the-counter  or prescription medicines as directed by your health care provider.  Follow up with your health care provider as directed.  Get regular pelvic exams and Pap tests. SEEK MEDICAL CARE IF:   Your periods are late, irregular, or painful, or they stop.  Your pelvic pain or abdominal pain does not go away.  Your abdomen becomes  larger or swollen.  You have pressure on your bladder or trouble emptying your bladder completely.  You have pain during sexual intercourse.  You have feelings of fullness, pressure, or discomfort in your stomach.  You lose weight for no apparent reason.  You feel generally ill.  You become constipated.  You lose your appetite.  You develop acne.  You have an increase in body and facial hair.  You are gaining weight, without changing your exercise and eating habits.  You think you are pregnant. SEEK IMMEDIATE MEDICAL CARE IF:   You have increasing abdominal pain.  You feel sick to your stomach (nauseous), and you throw up (vomit).  You develop a fever that comes on suddenly.  You have abdominal pain during a bowel movement.  Your menstrual periods become heavier than usual. MAKE SURE YOU:  Understand these instructions.  Will watch your condition.  Will get help right away if you are not doing well or get worse.   This information is not intended to replace advice given to you by your health care provider. Make sure you discuss any questions you have with your health care provider.   Document Released: 10/15/2005 Document Revised: 10/20/2013 Document Reviewed: 06/22/2013 Elsevier Interactive Patient Education 2016 Elsevier Inc[employer] CA-125 Tumor Marker Test WHY AM I HAVING THIS TEST? This test is used to check the level of cancer antigen 125 (CA-125) in your blood. The CA-125 tumor marker test can be helpful in detecting ovarian cancer. The test is only performed if you are considered at high risk for ovarian cancer. Your health care provider may recommend this test if:  You have a strong family history of ovarian cancer.  You have a breast cancer antigen (BRCA) genetic defect. If you have already been diagnosed with ovarian cancer, your health care provider may use this test to help identify the extent of the disease and to monitor your response to treatment. WHAT  KIND OF SAMPLE IS TAKEN? A blood sample is required for this test. It is usually collected by inserting a needle into a vein. HOW DO I PREPARE FOR THE TEST? There is no preparation required for this test. WHAT ARE THE REFERENCE RANGES? Reference ranges are considered healthy ranges established after testing a large group of healthy people. Reference ranges may vary among different people, labs, and hospitals. It is your responsibility to obtain your test results. Ask the lab or department performing the test when and how you will get your results. The reference range for this test is 0-35 units/mL or less than 35 kunits/L (SI units). WHAT DO THE RESULTS MEAN? Increased levels of CA-125 may indicate:  Certain types of cancer, including:  Ovarian cancer.  Pancreatic cancer.  Colon cancer.  Lung cancer.  Breast cancer.  Lymphoma.  Noncancerous (benign) disorders, including:  Cirrhosis.  Pregnancy.  Endometriosis.  Pancreatitis.  Pelvic inflammatory disease (PID). Talk with your health care provider to discuss your results, treatment options, and if necessary, the need for more tests. Talk with your health care provider if you have any questions about your results.   This information is not intended to replace advice given to  you by your health care provider. Make sure you discuss any questions you have with your health care provider.   Document Released: 11/06/2004 Document Revised: 11/05/2014 Document Reviewed: 03/04/2014 Elsevier Interactive Patient Education [employer].

## 2015-12-01 LAB — CA 125: CA 125: 9 U/mL (ref <35)

## 2016-01-18 ENCOUNTER — Emergency Department (HOSPITAL_COMMUNITY)
Admission: EM | Admit: 2016-01-18 | Discharge: 2016-01-18 | Disposition: A | Discharge status: Home / Self Care | Payer: BLUE CROSS/BLUE SHIELD | Attending: Emergency Medicine | Admitting: Emergency Medicine

## 2016-01-18 ENCOUNTER — Encounter: Payer: Self-pay | Admitting: Women's Health

## 2016-01-18 ENCOUNTER — Encounter (HOSPITAL_COMMUNITY): Payer: Self-pay | Admitting: Emergency Medicine

## 2016-01-18 ENCOUNTER — Ambulatory Visit (INDEPENDENT_AMBULATORY_CARE_PROVIDER_SITE_OTHER): Payer: BLUE CROSS/BLUE SHIELD | Admitting: Women's Health

## 2016-01-18 ENCOUNTER — Emergency Department (HOSPITAL_COMMUNITY): Payer: BLUE CROSS/BLUE SHIELD

## 2016-01-18 VITALS — BP 128/88

## 2016-01-18 DIAGNOSIS — Z9049 Acquired absence of other specified parts of digestive tract: Secondary | ICD-10-CM | POA: Insufficient documentation

## 2016-01-18 DIAGNOSIS — F419 Anxiety disorder, unspecified: Secondary | ICD-10-CM | POA: Insufficient documentation

## 2016-01-18 DIAGNOSIS — I10 Essential (primary) hypertension: Secondary | ICD-10-CM | POA: Insufficient documentation

## 2016-01-18 DIAGNOSIS — R1031 Right lower quadrant pain: Secondary | ICD-10-CM | POA: Diagnosis not present

## 2016-01-18 DIAGNOSIS — Z8742 Personal history of other diseases of the female genital tract: Secondary | ICD-10-CM | POA: Diagnosis not present

## 2016-01-18 DIAGNOSIS — Z79899 Other long term (current) drug therapy: Secondary | ICD-10-CM | POA: Diagnosis not present

## 2016-01-18 LAB — URINALYSIS W MICROSCOPIC + REFLEX CULTURE
Bilirubin Urine: NEGATIVE
CASTS: NONE SEEN [LPF]
CRYSTALS: NONE SEEN [HPF]
Glucose, UA: NEGATIVE
Hgb urine dipstick: NEGATIVE
NITRITE: NEGATIVE
PROTEIN: NEGATIVE
SPECIFIC GRAVITY, URINE: 1.025 (ref 1.001–1.035)
YEAST: NONE SEEN [HPF]
pH: 5.5 (ref 5.0–8.0)

## 2016-01-18 LAB — CBC
HEMATOCRIT: 39.7 % (ref 36.0–46.0)
HEMOGLOBIN: 12.8 g/dL (ref 12.0–15.0)
MCH: 28.1 pg (ref 26.0–34.0)
MCHC: 32.2 g/dL (ref 30.0–36.0)
MCV: 87.3 fL (ref 78.0–100.0)
Platelets: 242 10*3/uL (ref 150–400)
RBC: 4.55 MIL/uL (ref 3.87–5.11)
RDW: 15.8 % — ABNORMAL HIGH (ref 11.5–15.5)
WBC: 7.1 10*3/uL (ref 4.0–10.5)

## 2016-01-18 LAB — COMPREHENSIVE METABOLIC PANEL
ALBUMIN: 4.5 g/dL (ref 3.5–5.0)
ALT: 19 U/L (ref 14–54)
ANION GAP: 11 (ref 5–15)
AST: 18 U/L (ref 15–41)
Alkaline Phosphatase: 49 U/L (ref 38–126)
BILIRUBIN TOTAL: 0.6 mg/dL (ref 0.3–1.2)
BUN: 16 mg/dL (ref 6–20)
CHLORIDE: 104 mmol/L (ref 101–111)
CO2: 22 mmol/L (ref 22–32)
Calcium: 9.5 mg/dL (ref 8.9–10.3)
Creatinine, Ser: 0.76 mg/dL (ref 0.44–1.00)
GFR calc Af Amer: >60 mL/min (ref >60)
GFR calc non Af Amer: >60 mL/min (ref >60)
GLUCOSE: 75 mg/dL (ref 65–99)
POTASSIUM: 4 mmol/L (ref 3.5–5.1)
Sodium: 137 mmol/L (ref 135–145)
TOTAL PROTEIN: 7.6 g/dL (ref 6.5–8.1)

## 2016-01-18 LAB — URINALYSIS, ROUTINE W REFLEX MICROSCOPIC
Bilirubin Urine: NEGATIVE
GLUCOSE, UA: NEGATIVE mg/dL
Hgb urine dipstick: NEGATIVE
Ketones, ur: 40 mg/dL — AB
LEUKOCYTES UA: NEGATIVE
NITRITE: NEGATIVE
PH: 5.5 (ref 5.0–8.0)
Protein, ur: NEGATIVE mg/dL
SPECIFIC GRAVITY, URINE: 1.027 (ref 1.005–1.030)

## 2016-01-18 MED ORDER — IOPAMIDOL (ISOVUE-300) INJECTION 61%
100.0000 mL | Freq: Once | INTRAVENOUS | Status: AC | PRN
  Administered 2016-01-18: 100 mL via INTRAVENOUS

## 2016-01-18 MED ORDER — ONDANSETRON HCL 4 MG/2ML IJ SOLN
4.0000 mg | Freq: Once | INTRAMUSCULAR | Status: AC
  Administered 2016-01-18: 4 mg via INTRAVENOUS
  Filled 2016-01-18: qty 2

## 2016-01-18 MED ORDER — MORPHINE SULFATE (PF) 4 MG/ML IV SOLN
4.0000 mg | Freq: Once | INTRAVENOUS | Status: AC
  Administered 2016-01-18: 4 mg via INTRAVENOUS
  Filled 2016-01-18: qty 1

## 2016-01-18 MED ORDER — SODIUM CHLORIDE 0.9 % IV BOLUS (SEPSIS)
1000.0000 mL | Freq: Once | INTRAVENOUS | Status: AC
  Administered 2016-01-18: 1000 mL via INTRAVENOUS

## 2016-01-18 MED ORDER — IOHEXOL 300 MG/ML  SOLN: 25.0000 mL | Freq: Once | INTRAMUSCULAR | Status: DC | PRN

## 2016-01-18 NOTE — Addendum Note (Signed)
Addended by: Thurnell Garbe A on: 01/18/2016 12:33 PM   Modules accepted: Orders

## 2016-01-18 NOTE — ED Notes (Signed)
Per pt, states RLQ pain for tow weeks-saw GYN because she thought it was an ovarian cyst-sent here for appey

## 2016-01-18 NOTE — Progress Notes (Signed)
Patient ID: Madeline Hawkins, female   DOB: 1966/04/21, 50 y.o.   MRN: [id number] Presents with constant RLQ pain, occasional nausea, and constipation for the past 2 weeks. Had been able to go to work until today, but rates the pain now 10/10 today after 600 mg ibuprofen, with anorexia, no vomiting. Has been taking Tylenol and ibuprofen without complete relief of pain, pain is mildly relieved with standing vs. sitting. Denies fever, radiation of pain, vaginal discharge, or urinary symptoms. States her previous cysts have had the same presentation of severe, constant, localized pain. 2013 TAH for fibroids. History of right salpingo-oophorectomy with lysis of adhesions 05/2015 benign cyst. 11/2015 presented with LLQ pain, [place] found a 3.5 cm left ovarian cyst with fluid in the cul-de-sac, given Depo-Provera 150mg  IM and IM Toradol which transiently relieved her pain. Last bowel movement was several days ago, normally goes 2 times a week. Still has appendix.   Exam: Moderate distress secondary to pain. Bowel sounds present all 4 quadrants. Abdomen soft, tender to palpation in right lower quadrant. Positive obturator sign, positive Markle sign, no Rovsing's or psoas signs. Urinalysis: Trace leuk esterase, 20-40 squamous epis, 6-10 WBCs, many bacteria  Moderate RLQ pain TAH RSO for fibroids  History of cysts and adhesions  Plan: Declined IM Toradol due to burning sensation with injection, given 600 mg ibuprofen and observed. No relief of pain with ibuprofen, cannot rule out appendicitis at this time. Advised patient present to the emergency room for workup of RLQ pain and to rule out possible appendicitis. Agreeable with plan, instructed to call family, declines at this time states is able to drive self to ER.Marland Kitchen

## 2016-01-18 NOTE — Discharge Instructions (Signed)
Take 4 over the counter ibuprofen tablets 3 times a day or 2 over-the-counter naproxen tablets twice a day for pain.  Abdominal Pain, Adult Many things can cause abdominal pain. Usually, abdominal pain is not caused by a disease and will improve without treatment. It can often be observed and treated at home. Your health care provider will do a physical exam and possibly order blood tests and X-rays to help determine the seriousness of your pain. However, in many cases, more time must pass before a clear cause of the pain can be found. Before that point, your health care provider may not know if you need more testing or further treatment. HOME CARE INSTRUCTIONS Monitor your abdominal pain for any changes. The following actions may help to alleviate any discomfort you are experiencing:  Only take over-the-counter or prescription medicines as directed by your health care provider.  Do not take laxatives unless directed to do so by your health care provider.  Try a clear liquid diet (broth, tea, or water) as directed by your health care provider. Slowly move to a bland diet as tolerated. SEEK MEDICAL CARE IF:  You have unexplained abdominal pain.  You have abdominal pain associated with nausea or diarrhea.  You have pain when you urinate or have a bowel movement.  You experience abdominal pain that wakes you in the night.  You have abdominal pain that is worsened or improved by eating food.  You have abdominal pain that is worsened with eating fatty foods.  You have a fever. SEEK IMMEDIATE MEDICAL CARE IF:  Your pain does not go away within 2 hours.  You keep throwing up (vomiting).  Your pain is felt only in portions of the abdomen, such as the right side or the left lower portion of the abdomen.  You pass bloody or black tarry stools. MAKE SURE YOU:  Understand these instructions.  Will watch your condition.  Will get help right away if you are not doing well or get worse.   This information is not intended to replace advice given to you by your health care provider. Make sure you discuss any questions you have with your health care provider.   Document Released: 07/25/2005 Document Revised: 07/06/2015 Document Reviewed: 06/24/2013 Elsevier Interactive Patient Education [employer].

## 2016-01-18 NOTE — ED Provider Notes (Signed)
CSN: [id number]     Arrival date & time 01/18/16  1159 History   First MD Initiated Contact with Patient 01/18/16 1458     Chief Complaint  Patient presents with  . RLQ pain      (Consider location/radiation/quality/duration/timing/severity/associated sxs/prior Treatment) Patient is a 50 y.o. female presenting with abdominal pain. The history is provided by the patient.  Abdominal Pain Pain location:  RLQ Pain quality: sharp and shooting   Pain radiates to:  Does not radiate Pain severity:  Severe Onset quality:  Gradual Duration:  2 weeks Timing:  Constant Progression:  Worsening Chronicity:  New (feels like prior ovarian cyst) Relieved by:  Nothing Worsened by:  Movement, palpation and position changes Ineffective treatments:  None tried Associated symptoms: no anorexia, no chest pain, no chills, no constipation, no diarrhea, no dysuria, no fever, no nausea, no shortness of breath, no vaginal bleeding, no vaginal discharge and no vomiting    50 yo F With a chief complaint of right lower quadrant tenderness. This been going on for about 2 weeks. Feels like a prior ovarian cyst. Patient had her right ovary removed previously. Pain is worse with movement or palpation. When she points the pain it's localized about the inguinal ligament. Was seen today by her obstetrician who is concerned that she had appendicitis so she was sent here for rule out. Worse with movement palpation. Sharp stabbing pain. Denies radiation.  Past Medical History  Diagnosis Date  . CIN I (cervical intraepithelial neoplasia I) 1989    cryo...also in 2008 .. had cryo   . Hypertension   . Anxiety    Past Surgical History  Procedure Laterality Date  . Salpingectomy      left for ectopic pregnancy  . Gynecologic cryosurgery  07/07/2007  . Tubal ligation      right tubal ligation  . Laparoscopy  11/22/2011    Procedure: LAPAROSCOPY DIAGNOSTIC;  Surgeon: Terrance Mass, MD;  Location: [facility];  Service:  Gynecology;  Laterality: N/A;  . Abdominal hysterectomy  11/22/2011    Procedure: HYSTERECTOMY ABDOMINAL;  Surgeon: Terrance Mass, MD;  Location: [facility];  Service: Gynecology;  Laterality: N/A;  . Ovarian cyst removal  11/22/2011    Procedure: OVARIAN CYSTECTOMY;  Surgeon: Terrance Mass, MD;  Location: [facility];  Service: Gynecology;  Laterality: Left;  . Colon polyps removed    . Laparoscopic unilateral salpingo oopherectomy Right 06/28/2015    Procedure: LAPAROSCOPIC RIGHT SALPINGO OOPHORECTOMY;  Surgeon: Terrance Mass, MD;  Location: [facility];  Service: Gynecology;  Laterality: Right;  . Laparoscopic lysis of adhesions  06/28/2015    Procedure: LAPAROSCOPIC LYSIS OF ABDOMINAL PELVIC ADHESIONS;  Surgeon: Terrance Mass, MD;  Location: [facility];  Service: Gynecology;;   Family History  Problem Relation Age of Onset  . Diabetes Brother   . Ovarian cancer Mother   . Ovarian cancer Paternal Aunt   . Heart disease Maternal Grandmother   . Breast cancer Maternal Grandmother     Age 56's  . Breast cancer Maternal Aunt     Age 58's   Social History  Substance Use Topics  . Smoking status: Never Smoker   . Smokeless tobacco: Never Used  . Alcohol Use: No   OB History    Gravida Para Term Preterm AB TAB SAB Ectopic Multiple Living   5 4 4  1   0 1  4     Review of Systems  Constitutional: Negative for fever and chills.  HENT: Negative for congestion and rhinorrhea.   Eyes: Negative for redness and visual disturbance.  Respiratory: Negative for shortness of breath and wheezing.   Cardiovascular: Negative for chest pain and palpitations.  Gastrointestinal: Positive for abdominal pain. Negative for nausea, vomiting, diarrhea, constipation and anorexia.  Genitourinary: Negative for dysuria, urgency, vaginal bleeding, vaginal discharge and vaginal pain.  Musculoskeletal: Negative for myalgias and arthralgias.  Skin: Negative for pallor and wound.  Neurological: Negative for dizziness  and headaches.      Allergies  Review of patient's allergies indicates no known allergies.  Home Medications   Prior to Admission medications   Medication Sig Start Date End Date Taking? Authorizing Provider  ALPRAZolam (XANAX) 0.25 MG tablet Take 1 tablet (0.25 mg total) by mouth at bedtime as needed for anxiety. 08/05/15  Yes Terrance Mass, MD  atorvastatin (LIPITOR) 10 MG tablet Take 10 mg by mouth daily.   Yes Historical Provider, MD  ketorolac (TORADOL) 10 MG tablet Take 1 tablet (10 mg total) by mouth every 6 (six) hours as needed. Patient taking differently: Take 10 mg by mouth every 6 (six) hours as needed for moderate pain.  11/30/15  Yes Terrance Mass, MD  lisinopril-hydrochlorothiazide (PRINZIDE,ZESTORETIC) 10-12.5 MG per tablet Take 1 tablet by mouth daily.   Yes Historical Provider, MD  meclizine (ANTIVERT) 12.5 MG tablet Take 12.5 mg by mouth 3 (three) times daily as needed for dizziness.  10/17/15  Yes Historical Provider, MD  ondansetron (ZOFRAN) 4 MG tablet Take 4 mg by mouth every 6 (six) hours as needed. n/v 12/13/15  Yes Historical Provider, MD   BP 123/71 mmHg  Pulse 85  Temp(Src) 97.6 F (36.4 C) (Oral)  Resp 14  SpO2 100%  LMP 11/10/2011 Physical Exam  Constitutional: She is oriented to person, place, and time. She appears well-developed and well-nourished. No distress.  HENT:  Head: Normocephalic and atraumatic.  Eyes: EOM are normal. Pupils are equal, round, and reactive to light.  Neck: Normal range of motion. Neck supple.  Cardiovascular: Normal rate and regular rhythm.  Exam reveals no gallop and no friction rub.   No murmur heard. Pulmonary/Chest: Effort normal. She has no wheezes. She has no rales.  Abdominal: Soft. She exhibits no distension. There is tenderness. There is rebound.  RLQ pain, worst to the inguinal ligament on the right.  Negative rosvigs, +psoas and obturator  Musculoskeletal: She exhibits no edema or tenderness.  Neurological:  She is alert and oriented to person, place, and time.  Skin: Skin is warm and dry. She is not diaphoretic.  Psychiatric: She has a normal mood and affect. Her behavior is normal.  Nursing note and vitals reviewed.   ED Course  Procedures (including critical care time) Labs Review Labs Reviewed  CBC - Abnormal; Notable for the following:    RDW 15.8 (*)    All other components within normal limits  URINALYSIS, ROUTINE W REFLEX MICROSCOPIC (NOT AT [facility]) - Abnormal; Notable for the following:    Ketones, ur 40 (*)    All other components within normal limits  COMPREHENSIVE METABOLIC PANEL    Imaging Review Ct Abdomen Pelvis W Contrast  01/18/2016  CLINICAL DATA:  Right lower quadrant pain, history of ovarian cysts, possible appendicitis EXAM: CT ABDOMEN WITHOUT AND WITH CONTRAST CT PELVIS WITHOUT CONTRAST TECHNIQUE: Multidetector CT imaging of the abdomen was performed initially following the standard protocol before administration of intravenous contrast. Multidetector CT imaging of the abdomen was then performed following the standard  protocol during the bolus injection of intravenous contrast. Multidetector CT imaging of the pelvis was performed following the standard protocol without intravenous contrast. CONTRAST:  128mL ISOVUE-300 IOPAMIDOL (ISOVUE-300) INJECTION 61% COMPARISON:  11/08/2011 FINDINGS: Lower chest: Lung bases are unremarkable. Heart size within normal limits. No pericardial effusion. Hepatobiliary: Enhanced liver shows no focal mass. No calcified gallstones are noted within gallbladder. No intrahepatic biliary ductal dilatation. No CBD dilatation. Pancreas: Enhanced pancreas shows no focal mass or peripancreatic inflammation. Spleen:  Unremarkable Adrenals/Urinary Tract: No adrenal gland mass is noted. Enhanced kidneys are symmetrical in size. No hydronephrosis or hydroureter. Delayed renal images shows bilateral renal symmetrical excretion. Bilateral visualized proximal ureter  is unremarkable. Stomach/Bowel: There is no gastric outlet obstruction. No small bowel obstruction. No thickened or dilated small bowel loops. Some colonic stool noted in right colon and transverse colon. Normal appendix is noted in axial image 57. No pericecal inflammation. The terminal ileum is unremarkable. No distal colonic obstruction. Some colonic stool and gas noted in left colon and proximal sigmoid colon. No colitis or diverticulitis. Vascular/Lymphatic: No aortic aneurysm. No retroperitoneal or mesenteric adenopathy. Reproductive: The uterus is surgically absent. The right ovary is not identified. There is a left ovarian cyst measures 2.6 cm. No pelvic free fluid. The urinary bladder is unremarkable. Other: Again noted a umbilical and supraumbilical hernia containing omental fat measures 2.2 by 2.3 cm without evidence of acute complication. This is best seen in sagittal image 58. Musculoskeletal: Sagittal images of the spine shows mild degenerative changes lower thoracic spine. No destructive bony lesions are noted. No destructive bony lesions are noted within pelvis. Mild degenerative changes pubic symphysis. Bilateral hip joints are symmetrical in appearance on coronal views. IMPRESSION: 1. There is no evidence of acute inflammatory process within abdomen. 2. Normal appendix.  No pericecal inflammation. 3. Again noted umbilical and supraumbilical component small hernia containing fat without evidence of acute complication. Measures 2.3 x 2.3 cm. 4. No hydronephrosis or hydroureter. Bilateral renal symmetrical excretion. 5. Surgically absent uterus. The right ovary is not identified. There is a simple cyst within left ovary measures 2.6 cm. No pelvic free fluid. 6. Minimal degenerative changes lower thoracic spine. Electronically Signed   By: Lahoma Crocker M.D.   On: 01/18/2016 16:33   I have personally reviewed and evaluated these images and lab results as part of my medical decision-making.   EKG  Interpretation None      MDM   Final diagnoses:  RLQ abdominal pain    50 yo F with right lower quadrant tenderness. Tenderness with rebound on exam. Will obtain a CT scan of the abdomen and pelvis with contrast.  CT scan without appy, no signs of femoral hernia, no ovarian pathology.  Images viewed by myself.  Discussed results with patient, as pain is worse with movement will trial course of nsaids for possible muscle strain.  Feel appy, femoral hernia unlikely.   4:51 PM:  I have discussed the diagnosis/risks/treatment options with the patient and family and believe the pt to be eligible for discharge home to follow-up with PCP. We also discussed returning to the ED immediately if new or worsening sx occur. We discussed the sx which are most concerning (e.g., sudden worsening pain, fever, inability to tolerate by mouth) that necessitate immediate return. Medications administered to the patient during their visit and any new prescriptions provided to the patient are listed below.  Medications given during this visit Medications  iohexol (OMNIPAQUE) 300 MG/ML solution 25 mL (not administered)  morphine 4 MG/ML injection 4 mg (4 mg Intravenous Given 01/18/16 1537)  ondansetron (ZOFRAN) injection 4 mg (4 mg Intravenous Given 01/18/16 1537)  sodium chloride 0.9 % bolus 1,000 mL (0 mLs Intravenous Stopped 01/18/16 1649)  iopamidol (ISOVUE-300) 61 % injection 100 mL (100 mLs Intravenous Contrast Given 01/18/16 1607)    Discharge Medication List as of 01/18/2016  4:38 PM      The patient appears reasonably screen and/or stabilized for discharge and I doubt any other medical condition or other [facility] requiring further screening, evaluation, or treatment in the ED at this time prior to discharge.     Deno Etienne, DO 01/18/16 1651

## 2016-01-18 NOTE — ED Notes (Signed)
Patient presents to [facility] for complaints of lower right abdominal pain. She has a history of ovarian cysts and feels the pain is similar but worse. She went to her GYN for an appointment but they were unable to ultrasound her. GYN was concerned for appendicitis due to a positive Markle test. She is accompanied by her husband today.

## 2016-01-20 LAB — URINE CULTURE
Colony Count: NO GROWTH
Organism ID, Bacteria: NO GROWTH

## 2016-01-31 ENCOUNTER — Telehealth: Payer: Self-pay | Admitting: *Deleted

## 2016-01-31 ENCOUNTER — Encounter: Payer: Self-pay | Admitting: *Deleted

## 2016-01-31 NOTE — Telephone Encounter (Signed)
Pre-Visit Call completed with patient and chart updated.   Pre-Visit Info documented in Specialty Comments under [employer].    

## 2016-02-01 ENCOUNTER — Encounter: Payer: Self-pay | Admitting: Physician Assistant

## 2016-02-01 ENCOUNTER — Ambulatory Visit (INDEPENDENT_AMBULATORY_CARE_PROVIDER_SITE_OTHER): Payer: BLUE CROSS/BLUE SHIELD | Admitting: Physician Assistant

## 2016-02-01 VITALS — BP 120/68 | HR 84 | Temp 98.0°F | Ht 64.0 in | Wt 204.0 lb

## 2016-02-01 DIAGNOSIS — R079 Chest pain, unspecified: Secondary | ICD-10-CM | POA: Diagnosis not present

## 2016-02-01 DIAGNOSIS — K219 Gastro-esophageal reflux disease without esophagitis: Secondary | ICD-10-CM | POA: Insufficient documentation

## 2016-02-01 DIAGNOSIS — M25551 Pain in right hip: Secondary | ICD-10-CM | POA: Insufficient documentation

## 2016-02-01 DIAGNOSIS — R1013 Epigastric pain: Secondary | ICD-10-CM | POA: Diagnosis not present

## 2016-02-01 LAB — TROPONIN I: TNIDX: 0 ug/L (ref 0.00–0.06)

## 2016-02-01 LAB — H. PYLORI ANTIBODY, IGG: H Pylori IgG: NEGATIVE

## 2016-02-01 MED ORDER — LISINOPRIL-HYDROCHLOROTHIAZIDE 10-12.5 MG PO TABS: 1.0000 | ORAL_TABLET | Freq: Every day | ORAL | Status: DC

## 2016-02-01 MED ORDER — ESOMEPRAZOLE MAGNESIUM 20 MG PO CPDR: 20.0000 mg | DELAYED_RELEASE_CAPSULE | Freq: Every day | ORAL | Status: DC

## 2016-02-01 MED ORDER — CELECOXIB 100 MG PO CAPS: 100.0000 mg | ORAL_CAPSULE | Freq: Two times a day (BID) | ORAL | Status: DC

## 2016-02-01 MED ORDER — ATORVASTATIN CALCIUM 10 MG PO TABS: 10.0000 mg | ORAL_TABLET | Freq: Every day | ORAL | Status: DC

## 2016-02-01 NOTE — Progress Notes (Signed)
Patient presents to clinic today to establish care. Patient was seen in ER on 01/18/16 for RLQ pain. CT abdomen and pelvis obtained and negative for appendicitis of acute abnormality. Was felt pain was muscular. Patient given Rx Ketorolac to take. Is taking at night with improvement in pain. Still having R thigh and hip pain. Endorses pain with ROM of her leg. Denies trauma or injury. Denies numbness or tingling. Denies decreased ROM. Denies abdominal pain presently. Denies nausea or vomiting. Denies change to bowel or bladder habits. Of note CT revealed left ovarian cyst and surgically absent R ovary.   Patient endorses sternal pain over the past 2 days associated with increased reflux. Takes Prilosec over the counter but states it is subtherapeutic. Denies dysphagia, coughing. Symptoms worse at night. Symptoms improved today but are still present. Does have history of hypertension but denies history of CAD. Is taking BP medications as directed. Patient denies chest pain, palpitations, lightheadedness, dizziness, vision changes or frequent headaches.  Health Maintenance: Mammogram -- Followed by Gynecology. PAP -- s/p hysterectomy. Is followed by Gynecology.  Past Medical History  Diagnosis Date  . CIN I (cervical intraepithelial neoplasia I) 1989    cryo...also in 2008 .. had cryo   . Hypertension   . Anxiety   . Hyperlipidemia     Past Surgical History  Procedure Laterality Date  . Salpingectomy      left for ectopic pregnancy  . Gynecologic cryosurgery  07/07/2007  . Tubal ligation      right tubal ligation  . Laparoscopy  11/22/2011    Procedure: LAPAROSCOPY DIAGNOSTIC;  Surgeon: Terrance Mass, MD;  Location: [facility];  Service: Gynecology;  Laterality: N/A;  . Abdominal hysterectomy  11/22/2011    Procedure: HYSTERECTOMY ABDOMINAL;  Surgeon: Terrance Mass, MD;  Location: [facility];  Service: Gynecology;  Laterality: N/A;  . Ovarian cyst removal  11/22/2011    Procedure: OVARIAN  CYSTECTOMY;  Surgeon: Terrance Mass, MD;  Location: [facility];  Service: Gynecology;  Laterality: Left;  . Colon polyps removed    . Laparoscopic unilateral salpingo oopherectomy Right 06/28/2015    Procedure: LAPAROSCOPIC RIGHT SALPINGO OOPHORECTOMY;  Surgeon: Terrance Mass, MD;  Location: [facility];  Service: Gynecology;  Laterality: Right;  . Laparoscopic lysis of adhesions  06/28/2015    Procedure: LAPAROSCOPIC LYSIS OF ABDOMINAL PELVIC ADHESIONS;  Surgeon: Terrance Mass, MD;  Location: [facility];  Service: Gynecology;;    Current Outpatient Prescriptions on File Prior to Visit  Medication Sig Dispense Refill  . ALPRAZolam (XANAX) 0.25 MG tablet Take 1 tablet (0.25 mg total) by mouth at bedtime as needed for anxiety. 30 tablet 1  . meclizine (ANTIVERT) 12.5 MG tablet Take 12.5 mg by mouth 3 (three) times daily as needed for dizziness.      No current facility-administered medications on file prior to visit.    No Known Allergies  Family History  Problem Relation Age of Onset  . Diabetes Brother   . Ovarian cancer Mother   . Ovarian cancer Paternal Aunt   . Heart disease Maternal Grandmother   . Breast cancer Maternal Grandmother     Age 77's  . Breast cancer Maternal Aunt     Age 17's    Social History   Social History  . Marital Status: Married    Spouse Name: N/A  . Number of Children: N/A  . Years of Education: N/A   Occupational History  . Not on file.   Social  History Main Topics  . Smoking status: Never Smoker   . Smokeless tobacco: Never Used  . Alcohol Use: No  . Drug Use: No  . Sexual Activity: Yes    Birth Control/ Protection: Surgical     Comment: tubal ligation   Other Topics Concern  . Not on file   Social History Narrative   Review of Systems  Constitutional: Negative for fever and malaise/fatigue.  Respiratory: Negative for cough and shortness of breath.   Cardiovascular: Negative for palpitations.  Gastrointestinal: Positive for heartburn.  Negative for nausea, vomiting, diarrhea, constipation, blood in stool and melena.       + epigastric pain  Genitourinary: Negative for dysuria, urgency, frequency, hematuria and flank pain.  Neurological: Negative for dizziness, loss of consciousness and headaches.  Psychiatric/Behavioral: Negative for depression. The patient is not nervous/anxious.     BP 120/68 mmHg  Pulse 84  Temp(Src) 98 F (36.7 C) (Oral)  Ht _0  (1.626 m)  Wt 204 lb (92.534 kg)  BMI 35.00 kg/m2  SpO2 99%  LMP 11/10/2011  Physical Exam  Constitutional: She is oriented to person, place, and time and well-developed, well-nourished, and in no distress.  HENT:  Head: Normocephalic and atraumatic.  Eyes: Conjunctivae are normal. Pupils are equal, round, and reactive to light.  Neck: Neck supple. No thyromegaly present.  Cardiovascular: Normal rate, regular rhythm, normal heart sounds and intact distal pulses.   Pulmonary/Chest: Effort normal and breath sounds normal. No respiratory distress. She has no wheezes. She has no rales. She exhibits tenderness.  Abdominal: Soft. Bowel sounds are normal. She exhibits no distension and no mass. There is tenderness. There is no rebound and no guarding.  + epigastric tenderness  Musculoskeletal:       Right hip: She exhibits normal range of motion and normal strength.       Legs: Lymphadenopathy:    She has no cervical adenopathy.  Neurological: She is alert and oriented to person, place, and time.  Skin: Skin is warm and dry. No rash noted.  Psychiatric: Affect normal.  Vitals reviewed.   Recent Results (from the past 2160 hour(s))  CBC with Differential/Platelet     Status: None   Collection Time: 11/30/15  1:07 PM  Result Value Ref Range   WBC 6.4 4.0 - 10.5 K/uL   RBC 4.57 3.87 - 5.11 MIL/uL   Hemoglobin 12.8 12.0 - 15.0 g/dL   HCT 39.2 36.0 - 46.0 %   MCV 85.8 78.0 - 100.0 fL   MCH 28.0 26.0 - 34.0 pg   MCHC 32.7 30.0 - 36.0 g/dL   RDW 15.5 11.5 - 15.5 %     Platelets 216 150 - 400 K/uL   MPV 8.8 8.6 - 12.4 fL   Neutrophils Relative % 52 43 - 77 %   Neutro Abs 3.3 1.7 - 7.7 K/uL   Lymphocytes Relative 38 12 - 46 %   Lymphs Abs 2.4 0.7 - 4.0 K/uL   Monocytes Relative 8 3 - 12 %   Monocytes Absolute 0.5 0.1 - 1.0 K/uL   Eosinophils Relative 1 0 - 5 %   Eosinophils Absolute 0.1 0.0 - 0.7 K/uL   Basophils Relative 1 0 - 1 %   Basophils Absolute 0.1 0.0 - 0.1 K/uL   Smear Review Criteria for review not met   CA 125     Status: None   Collection Time: 11/30/15  1:07 PM  Result Value Ref Range   CA 125 9 <  35 U/mL    Comment:   This test was performed using the Beckman Coulter chemiluminescent method.  Values obtained from different assay methods cannot be used interchangeably.  CA125 levels , regardless of value, should not be interpreted as absolute evidence of the presence or absence of disease.   Urinalysis with Culture Reflex     Status: Abnormal   Collection Time: 01/18/16 12:47 PM  Result Value Ref Range   Color, Urine YELLOW YELLOW    Comment: ** Please note change in unit of measure and reference range(s). **      APPearance CLEAR CLEAR   Specific Gravity, Urine 1.025 1.001 - 1.035   pH 5.5 5.0 - 8.0   Glucose, UA NEGATIVE NEGATIVE   Bilirubin Urine NEGATIVE NEGATIVE   Ketones, ur TRACE (A) NEGATIVE   Hgb urine dipstick NEGATIVE NEGATIVE   Protein, ur NEGATIVE NEGATIVE   Nitrite NEGATIVE NEGATIVE   Leukocytes, UA TRACE (A) NEGATIVE   WBC, UA 6-10 (A) <=5 WBC/HPF   RBC / HPF 0-2 <=2 RBC/HPF   Squamous Epithelial / LPF 20-40 (A) <=5 HPF   Bacteria, UA MANY (A) NONE SEEN HPF   Crystals NONE SEEN NONE SEEN HPF   Casts NONE SEEN NONE SEEN LPF   Yeast NONE SEEN NONE SEEN HPF   Urine-Other SEE NOTE     Comment: Few mucous threads *URINE CULTURE PENDING*   Urine culture     Status: None   Collection Time: 01/18/16 12:47 PM  Result Value Ref Range   Colony Count NO GROWTH    Organism ID, Bacteria NO GROWTH    Comprehensive metabolic panel     Status: None   Collection Time: 01/18/16 12:50 PM  Result Value Ref Range   Sodium 137 135 - 145 mmol/L   Potassium 4.0 3.5 - 5.1 mmol/L   Chloride 104 101 - 111 mmol/L   CO2 22 22 - 32 mmol/L   Glucose, Bld 75 65 - 99 mg/dL   BUN 16 6 - 20 mg/dL   Creatinine, Ser 0.76 0.44 - 1.00 mg/dL   Calcium 9.5 8.9 - 10.3 mg/dL   Total Protein 7.6 6.5 - 8.1 g/dL   Albumin 4.5 3.5 - 5.0 g/dL   AST 18 15 - 41 U/L   ALT 19 14 - 54 U/L   Alkaline Phosphatase 49 38 - 126 U/L   Total Bilirubin 0.6 0.3 - 1.2 mg/dL   GFR calc non Af Amer >60 >60 mL/min   GFR calc Af Amer >60 >60 mL/min    Comment: (NOTE) The eGFR has been calculated using the CKD EPI equation. This calculation has not been validated in all clinical situations. eGFR's persistently <60 mL/min signify possible Chronic Kidney Disease.    Anion gap 11 5 - 15  CBC     Status: Abnormal   Collection Time: 01/18/16 12:50 PM  Result Value Ref Range   WBC 7.1 4.0 - 10.5 K/uL   RBC 4.55 3.87 - 5.11 MIL/uL   Hemoglobin 12.8 12.0 - 15.0 g/dL   HCT 39.7 36.0 - 46.0 %   MCV 87.3 78.0 - 100.0 fL   MCH 28.1 26.0 - 34.0 pg   MCHC 32.2 30.0 - 36.0 g/dL   RDW 15.8 (H) 11.5 - 15.5 %   Platelets 242 150 - 400 K/uL  Urinalysis, Routine w reflex microscopic (not at [facility])     Status: Abnormal   Collection Time: 01/18/16  3:24 PM  Result Value Ref Range  Color, Urine YELLOW YELLOW   APPearance CLEAR CLEAR   Specific Gravity, Urine 1.027 1.005 - 1.030   pH 5.5 5.0 - 8.0   Glucose, UA NEGATIVE NEGATIVE mg/dL   Hgb urine dipstick NEGATIVE NEGATIVE   Bilirubin Urine NEGATIVE NEGATIVE   Ketones, ur 40 (A) NEGATIVE mg/dL   Protein, ur NEGATIVE NEGATIVE mg/dL   Nitrite NEGATIVE NEGATIVE   Leukocytes, UA NEGATIVE NEGATIVE    Comment: MICROSCOPIC NOT DONE ON URINES WITH NEGATIVE PROTEIN, BLOOD, LEUKOCYTES, NITRITE, OR GLUCOSE <1000 mg/dL.   Assessment/Plan: Abdominal pain, epigastric EKG reveals NSR. Will check  Troponin today just to be overly cautious. Patient in no distress. Increase in GERD with epigastric tenderness. Will check H. Pylori today. Prilosec sub therapeutic. Will begin Nexium daily. GERD diet reviewed. Probiotic daily. Follow-up 1 week.  Alarm signs/symptoms reviewed that would prompt ER assessment.  Right hip pain Pain with abduction and flexion. Again CT negative for acute abdomen. No lower abdominal pain on examination. Will stop Ketorolac and attempt trial of Celebrex twice daily since it will be better for GERD. Supportive measures reviewed. Recommended PT or Sports Medicine Assessment. Patient will give some thought. Follow-up 1 week.

## 2016-02-01 NOTE — Assessment & Plan Note (Signed)
EKG reveals NSR. Will check Troponin today just to be overly cautious. Patient in no distress. Increase in GERD with epigastric tenderness. Will check H. Pylori today. Prilosec sub therapeutic. Will begin Nexium daily. GERD diet reviewed. Probiotic daily. Follow-up 1 week.  Alarm signs/symptoms reviewed that would prompt ER assessment.

## 2016-02-01 NOTE — Patient Instructions (Addendum)
Please go to the lab for blood work. I will call with your results.  Please stop the Ketorolac. Start Celebrex as directed with food.  Apply Icy Hot to the R hip. If symptoms not resolving, recommended Sports Medicine Assessment.  Please start the Nexium as directed for reflux. Limit late night eating and heavy foods. Please start a probiotic daily.   Follow-up with me in 1 week for follow-up and physical. If anything worsens, please go to the ER.  Food Choices for Gastroesophageal Reflux Disease, Adult When you have gastroesophageal reflux disease (GERD), the foods you eat and your eating habits are very important. Choosing the right foods can help ease the discomfort of GERD. WHAT GENERAL GUIDELINES DO I NEED TO FOLLOW?  Choose fruits, vegetables, whole grains, low-fat dairy products, and low-fat meat, fish, and poultry.  Limit fats such as oils, salad dressings, butter, nuts, and avocado.  Keep a food diary to identify foods that cause symptoms.  Avoid foods that cause reflux. These may be different for different people.  Eat frequent small meals instead of three large meals each day.  Eat your meals slowly, in a relaxed setting.  Limit fried foods.  Cook foods using methods other than frying.  Avoid drinking alcohol.  Avoid drinking large amounts of liquids with your meals.  Avoid bending over or lying down until 2-3 hours after eating. WHAT FOODS ARE NOT RECOMMENDED? The following are some foods and drinks that may worsen your symptoms: Vegetables Tomatoes. Tomato juice. Tomato and spaghetti sauce. Chili peppers. Onion and garlic. Horseradish. Fruits Oranges, grapefruit, and lemon (fruit and juice). Meats High-fat meats, fish, and poultry. This includes hot dogs, ribs, ham, sausage, salami, and bacon. Dairy Whole milk and chocolate milk. Sour cream. Cream. Butter. Ice cream. Cream cheese.  Beverages Coffee and tea, with or without caffeine. Carbonated beverages  or energy drinks. Condiments Hot sauce. Barbecue sauce.  Sweets/Desserts Chocolate and cocoa. Donuts. Peppermint and spearmint. Fats and Oils High-fat foods, including [place] fries and potato chips. Other Vinegar. Strong spices, such as black pepper, white pepper, red pepper, cayenne, curry powder, cloves, ginger, and chili powder. The items listed above may not be a complete list of foods and beverages to avoid. Contact your dietitian for more information.   This information is not intended to replace advice given to you by your health care provider. Make sure you discuss any questions you have with your health care provider.   Document Released: 10/15/2005 Document Revised: 11/05/2014 Document Reviewed: 08/19/2013 Elsevier Interactive Patient Education [employer].

## 2016-02-01 NOTE — Assessment & Plan Note (Signed)
Pain with abduction and flexion. Again CT negative for acute abdomen. No lower abdominal pain on examination. Will stop Ketorolac and attempt trial of Celebrex twice daily since it will be better for GERD. Supportive measures reviewed. Recommended PT or Sports Medicine Assessment. Patient will give some thought. Follow-up 1 week.

## 2016-02-01 NOTE — Progress Notes (Signed)
Pre visit review using our clinic review tool, if applicable. No additional management support is needed unless otherwise documented below in the visit note. 

## 2016-02-07 ENCOUNTER — Telehealth: Payer: Self-pay

## 2016-02-07 NOTE — Telephone Encounter (Signed)
Left message on patients answering machine for pre visit call.

## 2016-02-08 ENCOUNTER — Encounter: Payer: Self-pay | Admitting: Physician Assistant

## 2016-02-08 ENCOUNTER — Encounter: Payer: Self-pay | Admitting: *Deleted

## 2016-02-08 ENCOUNTER — Ambulatory Visit (INDEPENDENT_AMBULATORY_CARE_PROVIDER_SITE_OTHER): Payer: BLUE CROSS/BLUE SHIELD | Admitting: Physician Assistant

## 2016-02-08 VITALS — BP 106/58 | HR 90 | Temp 98.0°F | Ht 64.0 in | Wt 205.6 lb

## 2016-02-08 DIAGNOSIS — I10 Essential (primary) hypertension: Secondary | ICD-10-CM | POA: Diagnosis not present

## 2016-02-08 DIAGNOSIS — E785 Hyperlipidemia, unspecified: Secondary | ICD-10-CM | POA: Diagnosis not present

## 2016-02-08 DIAGNOSIS — Z0000 Encounter for general adult medical examination without abnormal findings: Secondary | ICD-10-CM

## 2016-02-08 DIAGNOSIS — K219 Gastro-esophageal reflux disease without esophagitis: Secondary | ICD-10-CM | POA: Diagnosis not present

## 2016-02-08 DIAGNOSIS — E668 Other obesity: Secondary | ICD-10-CM

## 2016-02-08 DIAGNOSIS — IMO0002 Reserved for concepts with insufficient information to code with codable children: Secondary | ICD-10-CM

## 2016-02-08 LAB — LIPID PANEL
CHOL/HDL RATIO: 4
CHOLESTEROL: 143 mg/dL (ref 0–200)
HDL: 40.1 mg/dL (ref >39.00)
LDL CALC: 93 mg/dL (ref 0–99)
NonHDL: 102.51
TRIGLYCERIDES: 46 mg/dL (ref 0.0–149.0)
VLDL: 9.2 mg/dL (ref 0.0–40.0)

## 2016-02-08 LAB — CBC
HEMATOCRIT: 37.4 % (ref 36.0–46.0)
HEMOGLOBIN: 12.3 g/dL (ref 12.0–15.0)
MCHC: 32.9 g/dL (ref 30.0–36.0)
MCV: 85.3 fl (ref 78.0–100.0)
PLATELETS: 225 10*3/uL (ref 150.0–400.0)
RBC: 4.39 Mil/uL (ref 3.87–5.11)
RDW: 17.1 % — ABNORMAL HIGH (ref 11.5–15.5)
WBC: 5.3 10*3/uL (ref 4.0–10.5)

## 2016-02-08 LAB — URINALYSIS, ROUTINE W REFLEX MICROSCOPIC
Bilirubin Urine: NEGATIVE
Hgb urine dipstick: NEGATIVE
KETONES UR: NEGATIVE
LEUKOCYTES UA: NEGATIVE
Nitrite: NEGATIVE
PH: 7 (ref 5.0–8.0)
RBC / HPF: NONE SEEN (ref 0–?)
SPECIFIC GRAVITY, URINE: 1.015 (ref 1.000–1.030)
Total Protein, Urine: NEGATIVE
URINE GLUCOSE: NEGATIVE
UROBILINOGEN UA: 0.2 (ref 0.0–1.0)

## 2016-02-08 LAB — COMPREHENSIVE METABOLIC PANEL
ALBUMIN: 4.1 g/dL (ref 3.5–5.2)
ALK PHOS: 45 U/L (ref 39–117)
ALT: 13 U/L (ref 0–35)
AST: 12 U/L (ref 0–37)
BILIRUBIN TOTAL: 0.5 mg/dL (ref 0.2–1.2)
BUN: 15 mg/dL (ref 6–23)
CALCIUM: 9.5 mg/dL (ref 8.4–10.5)
CO2: 28 mEq/L (ref 19–32)
Chloride: 107 mEq/L (ref 96–112)
Creatinine, Ser: 0.84 mg/dL (ref 0.40–1.20)
GFR: 93.22 mL/min (ref >60.00)
GLUCOSE: 73 mg/dL (ref 70–99)
POTASSIUM: 3.9 meq/L (ref 3.5–5.1)
Sodium: 141 mEq/L (ref 135–145)
TOTAL PROTEIN: 6.8 g/dL (ref 6.0–8.3)

## 2016-02-08 LAB — HEMOGLOBIN A1C: Hgb A1c MFr Bld: 5.7 % (ref 4.6–6.5)

## 2016-02-08 LAB — TSH: TSH: 1.57 u[IU]/mL (ref 0.35–4.50)

## 2016-02-08 MED ORDER — HYDROCHLOROTHIAZIDE 12.5 MG PO TABS: 12.5000 mg | ORAL_TABLET | Freq: Every day | ORAL | Status: DC

## 2016-02-08 MED ORDER — ATORVASTATIN CALCIUM 10 MG PO TABS: 10.0000 mg | ORAL_TABLET | Freq: Every day | ORAL | Status: DC

## 2016-02-08 NOTE — Progress Notes (Signed)
Pre visit review using our clinic review tool, if applicable. No additional management support is needed unless otherwise documented below in the visit note. 

## 2016-02-08 NOTE — Assessment & Plan Note (Deleted)
Depression screen negative. Health Maintenance reviewed. Preventive schedule discussed and handout given in AVS. Will obtain fasting labs today.  

## 2016-02-08 NOTE — Patient Instructions (Signed)
Please go to the lab for blood work. I will call with your results.  I am glad your symptoms are resolving with the Nexium. Please continue taking as directed.   Your BP is at the low end of normal despite being off of your medication for 4 days. I am stopping your combination pill. We will continue the diuretic at a very low dose to help with swelling. Follow-up 3-4 weeks for a BP recheck.  Preventive Care for Adults, Female A healthy lifestyle and preventive care can promote health and wellness. Preventive health guidelines for women include the following key practices.  A routine yearly physical is a good way to check with your health care provider about your health and preventive screening. It is a chance to share any concerns and updates on your health and to receive a thorough exam.  Visit your dentist for a routine exam and preventive care every 6 months. Brush your teeth twice a day and floss once a day. Good oral hygiene prevents tooth decay and gum disease.  The frequency of eye exams is based on your age, health, family medical history, use of contact lenses, and other factors. Follow your health care provider's recommendations for frequency of eye exams.  Eat a healthy diet. Foods like vegetables, fruits, whole grains, low-fat dairy products, and lean protein foods contain the nutrients you need without too many calories. Decrease your intake of foods high in solid fats, added sugars, and salt. Eat the right amount of calories for you.Get information about a proper diet from your health care provider, if necessary.  Regular physical exercise is one of the most important things you can do for your health. Most adults should get at least 150 minutes of moderate-intensity exercise (any activity that increases your heart rate and causes you to sweat) each week. In addition, most adults need muscle-strengthening exercises on 2 or more days a week.  Maintain a healthy weight. The body mass  index (BMI) is a screening tool to identify possible weight problems. It provides an estimate of body fat based on height and weight. Your health care provider can find your BMI and can help you achieve or maintain a healthy weight.For adults 20 years and older:  A BMI below 18.5 is considered underweight.  A BMI of 18.5 to 24.9 is normal.  A BMI of 25 to 29.9 is considered overweight.  A BMI of 30 and above is considered obese.  Maintain normal blood lipids and cholesterol levels by exercising and minimizing your intake of saturated fat. Eat a balanced diet with plenty of fruit and vegetables. Blood tests for lipids and cholesterol should begin at age 36 and be repeated every 5 years. If your lipid or cholesterol levels are high, you are over 50, or you are at high risk for heart disease, you may need your cholesterol levels checked more frequently.Ongoing high lipid and cholesterol levels should be treated with medicines if diet and exercise are not working.  If you smoke, find out from your health care provider how to quit. If you do not use tobacco, do not start.  Lung cancer screening is recommended for adults aged 25-80 years who are at high risk for developing lung cancer because of a history of smoking. A yearly low-dose CT scan of the lungs is recommended for people who have at least a 30-pack-year history of smoking and are a current smoker or have quit within the past 15 years. A pack year of smoking is  smoking an average of 1 pack of cigarettes a day for 1 year (for example: 1 pack a day for 30 years or 2 packs a day for 15 years). Yearly screening should continue until the smoker has stopped smoking for at least 15 years. Yearly screening should be stopped for people who develop a health problem that would prevent them from having lung cancer treatment.  If you are pregnant, do not drink alcohol. If you are breastfeeding, be very cautious about drinking alcohol. If you are not pregnant  and choose to drink alcohol, do not have more than 1 drink per day. One drink is considered to be 12 ounces (355 mL) of beer, 5 ounces (148 mL) of wine, or 1.5 ounces (44 mL) of liquor.  Avoid use of street drugs. Do not share needles with anyone. Ask for help if you need support or instructions about stopping the use of drugs.  High blood pressure causes heart disease and increases the risk of stroke. Your blood pressure should be checked at least every 1 to 2 years. Ongoing high blood pressure should be treated with medicines if weight loss and exercise do not work.  If you are 26-37 years old, ask your health care provider if you should take aspirin to prevent strokes.  Diabetes screening is done by taking a blood sample to check your blood glucose level after you have not eaten for a certain period of time (fasting). If you are not overweight and you do not have risk factors for diabetes, you should be screened once every 3 years starting at age 26. If you are overweight or obese and you are 57-48 years of age, you should be screened for diabetes every year as part of your cardiovascular risk assessment.  Breast cancer screening is essential preventive care for women. You should practice "breast self-awareness." This means understanding the normal appearance and feel of your breasts and may include breast self-examination. Any changes detected, no matter how small, should be reported to a health care provider. Women in their 4s and 30s should have a clinical breast exam (CBE) by a health care provider as part of a regular health exam every 1 to 3 years. After age 66, women should have a CBE every year. Starting at age 44, women should consider having a mammogram (breast X-ray test) every year. Women who have a family history of breast cancer should talk to their health care provider about genetic screening. Women at a high risk of breast cancer should talk to their health care providers about having  an MRI and a mammogram every year.  Breast cancer gene (BRCA)-related cancer risk assessment is recommended for women who have family members with BRCA-related cancers. BRCA-related cancers include breast, ovarian, tubal, and peritoneal cancers. Having family members with these cancers may be associated with an increased risk for harmful changes (mutations) in the breast cancer genes BRCA1 and BRCA2. Results of the assessment will determine the need for genetic counseling and BRCA1 and BRCA2 testing.  Your health care provider may recommend that you be screened regularly for cancer of the pelvic organs (ovaries, uterus, and vagina). This screening involves a pelvic examination, including checking for microscopic changes to the surface of your cervix (Pap test). You may be encouraged to have this screening done every 3 years, beginning at age 29.  For women ages 17-65, health care providers may recommend pelvic exams and Pap testing every 3 years, or they may recommend the Pap and pelvic exam,  combined with testing for human papilloma virus (HPV), every 5 years. Some types of HPV increase your risk of cervical cancer. Testing for HPV may also be done on women of any age with unclear Pap test results.  Other health care providers may not recommend any screening for nonpregnant women who are considered low risk for pelvic cancer and who do not have symptoms. Ask your health care provider if a screening pelvic exam is right for you.  If you have had past treatment for cervical cancer or a condition that could lead to cancer, you need Pap tests and screening for cancer for at least 20 years after your treatment. If Pap tests have been discontinued, your risk factors (such as having a new sexual partner) need to be reassessed to determine if screening should resume. Some women have medical problems that increase the chance of getting cervical cancer. In these cases, your health care provider may recommend more  frequent screening and Pap tests.  Colorectal cancer can be detected and often prevented. Most routine colorectal cancer screening begins at the age of 8 years and continues through age 40 years. However, your health care provider may recommend screening at an earlier age if you have risk factors for colon cancer. On a yearly basis, your health care provider may provide home test kits to check for hidden blood in the stool. Use of a small camera at the end of a tube, to directly examine the colon (sigmoidoscopy or colonoscopy), can detect the earliest forms of colorectal cancer. Talk to your health care provider about this at age 41, when routine screening begins. Direct exam of the colon should be repeated every 5-10 years through age 83 years, unless early forms of precancerous polyps or small growths are found.  People who are at an increased risk for hepatitis B should be screened for this virus. You are considered at high risk for hepatitis B if:  You were born in a country where hepatitis B occurs often. Talk with your health care provider about which countries are considered high risk.  Your parents were born in a high-risk country and you have not received a shot to protect against hepatitis B (hepatitis B vaccine).  You have HIV or AIDS.  You use needles to inject street drugs.  You live with, or have sex with, someone who has hepatitis B.  You get hemodialysis treatment.  You take certain medicines for conditions like cancer, organ transplantation, and autoimmune conditions.  Hepatitis C blood testing is recommended for all people born from [birth year] through 1965 and any individual with known risks for hepatitis C.  Practice safe sex. Use condoms and avoid high-risk sexual practices to reduce the spread of sexually transmitted infections (STIs). STIs include gonorrhea, chlamydia, syphilis, trichomonas, herpes, HPV, and human immunodeficiency virus (HIV). Herpes, HIV, and HPV are viral  illnesses that have no cure. They can result in disability, cancer, and death.  You should be screened for sexually transmitted illnesses (STIs) including gonorrhea and chlamydia if:  You are sexually active and are younger than 24 years.  You are older than 24 years and your health care provider tells you that you are at risk for this type of infection.  Your sexual activity has changed since you were last screened and you are at an increased risk for chlamydia or gonorrhea. Ask your health care provider if you are at risk.  If you are at risk of being infected with HIV, it is recommended that  you take a prescription medicine daily to prevent HIV infection. This is called preexposure prophylaxis (PrEP). You are considered at risk if:  You are sexually active and do not regularly use condoms or know the HIV status of your partner(s).  You take drugs by injection.  You are sexually active with a partner who has HIV.  Talk with your health care provider about whether you are at high risk of being infected with HIV. If you choose to begin PrEP, you should first be tested for HIV. You should then be tested every 3 months for as long as you are taking PrEP.  Osteoporosis is a disease in which the bones lose minerals and strength with aging. This can result in serious bone fractures or breaks. The risk of osteoporosis can be identified using a bone density scan. Women ages 73 years and over and women at risk for fractures or osteoporosis should discuss screening with their health care providers. Ask your health care provider whether you should take a calcium supplement or vitamin D to reduce the rate of osteoporosis.  Menopause can be associated with physical symptoms and risks. Hormone replacement therapy is available to decrease symptoms and risks. You should talk to your health care provider about whether hormone replacement therapy is right for you.  Use sunscreen. Apply sunscreen liberally and  repeatedly throughout the day. You should seek shade when your shadow is shorter than you. Protect yourself by wearing long sleeves, pants, a wide-brimmed hat, and sunglasses year round, whenever you are outdoors.  Once a month, do a whole body skin exam, using a mirror to look at the skin on your back. Tell your health care provider of new moles, moles that have irregular borders, moles that are larger than a pencil eraser, or moles that have changed in shape or color.  Stay current with required vaccines (immunizations).  Influenza vaccine. All adults should be immunized every year.  Tetanus, diphtheria, and acellular pertussis (Td, Tdap) vaccine. Pregnant women should receive 1 dose of Tdap vaccine during each pregnancy. The dose should be obtained regardless of the length of time since the last dose. Immunization is preferred during the 27th-36th week of gestation. An adult who has not previously received Tdap or who does not know her vaccine status should receive 1 dose of Tdap. This initial dose should be followed by tetanus and diphtheria toxoids (Td) booster doses every 10 years. Adults with an unknown or incomplete history of completing a 3-dose immunization series with Td-containing vaccines should begin or complete a primary immunization series including a Tdap dose. Adults should receive a Td booster every 10 years.  Varicella vaccine. An adult without evidence of immunity to varicella should receive 2 doses or a second dose if she has previously received 1 dose. Pregnant females who do not have evidence of immunity should receive the first dose after pregnancy. This first dose should be obtained before leaving the health care facility. The second dose should be obtained 4-8 weeks after the first dose.  Human papillomavirus (HPV) vaccine. Females aged 13-26 years who have not received the vaccine previously should obtain the 3-dose series. The vaccine is not recommended for use in pregnant  females. However, pregnancy testing is not needed before receiving a dose. If a female is found to be pregnant after receiving a dose, no treatment is needed. In that case, the remaining doses should be delayed until after the pregnancy. Immunization is recommended for any person with an immunocompromised condition through  the age of 26 years if she did not get any or all doses earlier. During the 3-dose series, the second dose should be obtained 4-8 weeks after the first dose. The third dose should be obtained 24 weeks after the first dose and 16 weeks after the second dose.  Zoster vaccine. One dose is recommended for adults aged 24 years or older unless certain conditions are present.  Measles, mumps, and rubella (MMR) vaccine. Adults born before [birth year] generally are considered immune to measles and mumps. Adults born in [birth year] or later should have 1 or more doses of MMR vaccine unless there is a contraindication to the vaccine or there is laboratory evidence of immunity to each of the three diseases. A routine second dose of MMR vaccine should be obtained at least 28 days after the first dose for students attending postsecondary schools, health care workers, or international travelers. People who received inactivated measles vaccine or an unknown type of measles vaccine during 1963-1967 should receive 2 doses of MMR vaccine. People who received inactivated mumps vaccine or an unknown type of mumps vaccine before 1979 and are at high risk for mumps infection should consider immunization with 2 doses of MMR vaccine. For females of childbearing age, rubella immunity should be determined. If there is no evidence of immunity, females who are not pregnant should be vaccinated. If there is no evidence of immunity, females who are pregnant should delay immunization until after pregnancy. Unvaccinated health care workers born before [birth year] who lack laboratory evidence of measles, mumps, or rubella immunity or laboratory  confirmation of disease should consider measles and mumps immunization with 2 doses of MMR vaccine or rubella immunization with 1 dose of MMR vaccine.  Pneumococcal 13-valent conjugate (PCV13) vaccine. When indicated, a person who is uncertain of his immunization history and has no record of immunization should receive the PCV13 vaccine. All adults 63 years of age and older should receive this vaccine. An adult aged 67 years or older who has certain medical conditions and has not been previously immunized should receive 1 dose of PCV13 vaccine. This PCV13 should be followed with a dose of pneumococcal polysaccharide (PPSV23) vaccine. Adults who are at high risk for pneumococcal disease should obtain the PPSV23 vaccine at least 8 weeks after the dose of PCV13 vaccine. Adults older than 48 years of age who have normal immune system function should obtain the PPSV23 vaccine dose at least 1 year after the dose of PCV13 vaccine.  Pneumococcal polysaccharide (PPSV23) vaccine. When PCV13 is also indicated, PCV13 should be obtained first. All adults aged 10 years and older should be immunized. An adult younger than age 19 years who has certain medical conditions should be immunized. Any person who resides in a nursing home or long-term care facility should be immunized. An adult smoker should be immunized. People with an immunocompromised condition and certain other conditions should receive both PCV13 and PPSV23 vaccines. People with human immunodeficiency virus (HIV) infection should be immunized as soon as possible after diagnosis. Immunization during chemotherapy or radiation therapy should be avoided. Routine use of PPSV23 vaccine is not recommended for American Indians, [address], or people younger than 65 years unless there are medical conditions that require PPSV23 vaccine. When indicated, people who have unknown immunization and have no record of immunization should receive PPSV23 vaccine. One-time  revaccination 5 years after the first dose of PPSV23 is recommended for people aged 19-64 years who have chronic kidney failure, nephrotic syndrome, asplenia, or immunocompromised conditions.  People who received 1-2 doses of PPSV23 before age 20 years should receive another dose of PPSV23 vaccine at age 14 years or later if at least 5 years have passed since the previous dose. Doses of PPSV23 are not needed for people immunized with PPSV23 at or after age 35 years.  Meningococcal vaccine. Adults with asplenia or persistent complement component deficiencies should receive 2 doses of quadrivalent meningococcal conjugate (MenACWY-D) vaccine. The doses should be obtained at least 2 months apart. Microbiologists working with certain meningococcal bacteria, [employer] recruits, people at risk during an outbreak, and people who travel to or live in countries with a high rate of meningitis should be immunized. A first-year college student up through age 5 years who is living in a residence hall should receive a dose if she did not receive a dose on or after her 16th birthday. Adults who have certain high-risk conditions should receive one or more doses of vaccine.  Hepatitis A vaccine. Adults who wish to be protected from this disease, have certain high-risk conditions, work with hepatitis A-infected animals, work in hepatitis A research labs, or travel to or work in countries with a high rate of hepatitis A should be immunized. Adults who were previously unvaccinated and who anticipate close contact with an international adoptee during the first 60 days after arrival in the [place] from a country with a high rate of hepatitis A should be immunized.  Hepatitis B vaccine. Adults who wish to be protected from this disease, have certain high-risk conditions, may be exposed to blood or other infectious body fluids, are household contacts or sex partners of hepatitis B positive people, are clients or workers in  certain care facilities, or travel to or work in countries with a high rate of hepatitis B should be immunized.  Haemophilus influenzae type b (Hib) vaccine. A previously unvaccinated person with asplenia or sickle cell disease or having a scheduled splenectomy should receive 1 dose of Hib vaccine. Regardless of previous immunization, a recipient of a hematopoietic stem cell transplant should receive a 3-dose series 6-12 months after her successful transplant. Hib vaccine is not recommended for adults with HIV infection. Preventive Services / Frequency Ages 75 to 59 years  Blood pressure check.** / Every 3-5 years.  Lipid and cholesterol check.** / Every 5 years beginning at age 66.  Clinical breast exam.** / Every 3 years for women in their 30s and 44s.  BRCA-related cancer risk assessment.** / For women who have family members with a BRCA-related cancer (breast, ovarian, tubal, or peritoneal cancers).  Pap test.** / Every 2 years from ages 67 through 65. Every 3 years starting at age 43 through age 97 or 43 with a history of 3 consecutive normal Pap tests.  HPV screening.** / Every 3 years from ages 93 through ages 77 to 92 with a history of 3 consecutive normal Pap tests.  Hepatitis C blood test.** / For any individual with known risks for hepatitis C.  Skin self-exam. / Monthly.  Influenza vaccine. / Every year.  Tetanus, diphtheria, and acellular pertussis (Tdap, Td) vaccine.** / Consult your health care provider. Pregnant women should receive 1 dose of Tdap vaccine during each pregnancy. 1 dose of Td every 10 years.  Varicella vaccine.** / Consult your health care provider. Pregnant females who do not have evidence of immunity should receive the first dose after pregnancy.  HPV vaccine. / 3 doses over 6 months, if 59 and younger. The vaccine is not recommended for use  in pregnant females. However, pregnancy testing is not needed before receiving a dose.  Measles, mumps, rubella  (MMR) vaccine.** / You need at least 1 dose of MMR if you were born in 1957 or later. You may also need a 2nd dose. For females of childbearing age, rubella immunity should be determined. If there is no evidence of immunity, females who are not pregnant should be vaccinated. If there is no evidence of immunity, females who are pregnant should delay immunization until after pregnancy.  Pneumococcal 13-valent conjugate (PCV13) vaccine.** / Consult your health care provider.  Pneumococcal polysaccharide (PPSV23) vaccine.** / 1 to 2 doses if you smoke cigarettes or if you have certain conditions.  Meningococcal vaccine.** / 1 dose if you are age 80 to 76 years and a Orthoptist living in a residence hall, or have one of several medical conditions, you need to get vaccinated against meningococcal disease. You may also need additional booster doses.  Hepatitis A vaccine.** / Consult your health care provider.  Hepatitis B vaccine.** / Consult your health care provider.  Haemophilus influenzae type b (Hib) vaccine.** / Consult your health care provider. Ages 66 to 64 years  Blood pressure check.** / Every year.  Lipid and cholesterol check.** / Every 5 years beginning at age 27 years.  Lung cancer screening. / Every year if you are aged 55-80 years and have a 30-pack-year history of smoking and currently smoke or have quit within the past 15 years. Yearly screening is stopped once you have quit smoking for at least 15 years or develop a health problem that would prevent you from having lung cancer treatment.  Clinical breast exam.** / Every year after age 59 years.  BRCA-related cancer risk assessment.** / For women who have family members with a BRCA-related cancer (breast, ovarian, tubal, or peritoneal cancers).  Mammogram.** / Every year beginning at age 62 years and continuing for as long as you are in good health. Consult with your health care provider.  Pap test.** / Every 3  years starting at age 54 years through age 29 or 37 years with a history of 3 consecutive normal Pap tests.  HPV screening.** / Every 3 years from ages 73 years through ages 61 to 65 years with a history of 3 consecutive normal Pap tests.  Fecal occult blood test (FOBT) of stool. / Every year beginning at age 53 years and continuing until age 58 years. You may not need to do this test if you get a colonoscopy every 10 years.  Flexible sigmoidoscopy or colonoscopy.** / Every 5 years for a flexible sigmoidoscopy or every 10 years for a colonoscopy beginning at age 70 years and continuing until age 15 years.  Hepatitis C blood test.** / For all people born from [birth year] through 1963 and any individual with known risks for hepatitis C.  Skin self-exam. / Monthly.  Influenza vaccine. / Every year.  Tetanus, diphtheria, and acellular pertussis (Tdap/Td) vaccine.** / Consult your health care provider. Pregnant women should receive 1 dose of Tdap vaccine during each pregnancy. 1 dose of Td every 10 years.  Varicella vaccine.** / Consult your health care provider. Pregnant females who do not have evidence of immunity should receive the first dose after pregnancy.  Zoster vaccine.** / 1 dose for adults aged 49 years or older.  Measles, mumps, rubella (MMR) vaccine.** / You need at least 1 dose of MMR if you were born in 1957 or later. You may also need a second dose.  For females of childbearing age, rubella immunity should be determined. If there is no evidence of immunity, females who are not pregnant should be vaccinated. If there is no evidence of immunity, females who are pregnant should delay immunization until after pregnancy.  Pneumococcal 13-valent conjugate (PCV13) vaccine.** / Consult your health care provider.  Pneumococcal polysaccharide (PPSV23) vaccine.** / 1 to 2 doses if you smoke cigarettes or if you have certain conditions.  Meningococcal vaccine.** / Consult your health care  provider.  Hepatitis A vaccine.** / Consult your health care provider.  Hepatitis B vaccine.** / Consult your health care provider.  Haemophilus influenzae type b (Hib) vaccine.** / Consult your health care provider. Ages 41 years and over  Blood pressure check.** / Every year.  Lipid and cholesterol check.** / Every 5 years beginning at age 88 years.  Lung cancer screening. / Every year if you are aged 59-80 years and have a 30-pack-year history of smoking and currently smoke or have quit within the past 15 years. Yearly screening is stopped once you have quit smoking for at least 15 years or develop a health problem that would prevent you from having lung cancer treatment.  Clinical breast exam.** / Every year after age 68 years.  BRCA-related cancer risk assessment.** / For women who have family members with a BRCA-related cancer (breast, ovarian, tubal, or peritoneal cancers).  Mammogram.** / Every year beginning at age 72 years and continuing for as long as you are in good health. Consult with your health care provider.  Pap test.** / Every 3 years starting at age 2 years through age 90 or 46 years with 3 consecutive normal Pap tests. Testing can be stopped between 65 and 70 years with 3 consecutive normal Pap tests and no abnormal Pap or HPV tests in the past 10 years.  HPV screening.** / Every 3 years from ages 84 years through ages 56 or 28 years with a history of 3 consecutive normal Pap tests. Testing can be stopped between 65 and 70 years with 3 consecutive normal Pap tests and no abnormal Pap or HPV tests in the past 10 years.  Fecal occult blood test (FOBT) of stool. / Every year beginning at age 39 years and continuing until age 26 years. You may not need to do this test if you get a colonoscopy every 10 years.  Flexible sigmoidoscopy or colonoscopy.** / Every 5 years for a flexible sigmoidoscopy or every 10 years for a colonoscopy beginning at age 45 years and continuing  until age 90 years.  Hepatitis C blood test.** / For all people born from [birth year] through 1963 and any individual with known risks for hepatitis C.  Osteoporosis screening.** / A one-time screening for women ages 54 years and over and women at risk for fractures or osteoporosis.  Skin self-exam. / Monthly.  Influenza vaccine. / Every year.  Tetanus, diphtheria, and acellular pertussis (Tdap/Td) vaccine.** / 1 dose of Td every 10 years.  Varicella vaccine.** / Consult your health care provider.  Zoster vaccine.** / 1 dose for adults aged 45 years or older.  Pneumococcal 13-valent conjugate (PCV13) vaccine.** / Consult your health care provider.  Pneumococcal polysaccharide (PPSV23) vaccine.** / 1 dose for all adults aged 74 years and older.  Meningococcal vaccine.** / Consult your health care provider.  Hepatitis A vaccine.** / Consult your health care provider.  Hepatitis B vaccine.** / Consult your health care provider.  Haemophilus influenzae type b (Hib) vaccine.** / Consult your health care provider. **  Family history and personal history of risk and conditions may change your health care provider's recommendations.   This information is not intended to replace advice given to you by your health care provider. Make sure you discuss any questions you have with your health care provider.   Document Released: 12/11/2001 Document Revised: 11/05/2014 Document Reviewed: 03/12/2011 Elsevier Interactive Patient Education [employer].

## 2016-02-08 NOTE — Progress Notes (Signed)
Patient presents to clinic today for annual exam.  Patient is fasting for labs.   Chronic Issues: Hypertension -- Is taking lisinopril-HCTZ as directed. Has been out of medications for 4 days. Patient denies chest pain, palpitations, lightheadedness, dizziness, vision changes or frequent headaches.  BP Readings from Last 3 Encounters:  02/08/16 106/58  02/01/16 120/68  01/18/16 123/71   Hyperlipidemia -- Endorses taking her Lipitor as directed. Denies myalgias.  GERD -- Endorses doing very well with Nexium. Epigastric discomfort and heart burn have resolved. Is avoiding trigger foods.  Obesity -- Body mass index is 35.27 kg/(m^2).   Health Maintenance: Immunizations -- up-to-date. HIV screen up-to-date Mammogram -- Up-to-date PAP -- s/p hysterectomy.  Past Medical History  Diagnosis Date  . CIN I (cervical intraepithelial neoplasia I) 1989    cryo...also in 2008 .. had cryo   . Hypertension   . Anxiety   . Hyperlipidemia     Past Surgical History  Procedure Laterality Date  . Salpingectomy      left for ectopic pregnancy  . Gynecologic cryosurgery  07/07/2007  . Tubal ligation      right tubal ligation  . Laparoscopy  11/22/2011    Procedure: LAPAROSCOPY DIAGNOSTIC;  Surgeon: Terrance Mass, MD;  Location: [facility];  Service: Gynecology;  Laterality: N/A;  . Abdominal hysterectomy  11/22/2011    Procedure: HYSTERECTOMY ABDOMINAL;  Surgeon: Terrance Mass, MD;  Location: [facility];  Service: Gynecology;  Laterality: N/A;  . Ovarian cyst removal  11/22/2011    Procedure: OVARIAN CYSTECTOMY;  Surgeon: Terrance Mass, MD;  Location: [facility];  Service: Gynecology;  Laterality: Left;  . Colon polyps removed    . Laparoscopic unilateral salpingo oopherectomy Right 06/28/2015    Procedure: LAPAROSCOPIC RIGHT SALPINGO OOPHORECTOMY;  Surgeon: Terrance Mass, MD;  Location: [facility];  Service: Gynecology;  Laterality: Right;  . Laparoscopic lysis of adhesions  06/28/2015   Procedure: LAPAROSCOPIC LYSIS OF ABDOMINAL PELVIC ADHESIONS;  Surgeon: Terrance Mass, MD;  Location: [facility];  Service: Gynecology;;    Current Outpatient Prescriptions on File Prior to Visit  Medication Sig Dispense Refill  . ALPRAZolam (XANAX) 0.25 MG tablet Take 1 tablet (0.25 mg total) by mouth at bedtime as needed for anxiety. 30 tablet 1  . celecoxib (CELEBREX) 100 MG capsule Take 1 capsule (100 mg total) by mouth 2 (two) times daily. 60 capsule 0  . esomeprazole (NEXIUM) 20 MG capsule Take 1 capsule (20 mg total) by mouth daily at 12 noon. 30 capsule 3  . meclizine (ANTIVERT) 12.5 MG tablet Take 12.5 mg by mouth 3 (three) times daily as needed for dizziness.      No current facility-administered medications on file prior to visit.    No Known Allergies  Family History  Problem Relation Age of Onset  . Diabetes Brother   . Ovarian cancer Mother   . Ovarian cancer Paternal Aunt   . Heart disease Maternal Grandmother   . Breast cancer Maternal Grandmother     Age 88's  . Breast cancer Maternal Aunt     Age 66's    Social History   Social History  . Marital Status: Married    Spouse Name: N/A  . Number of Children: N/A  . Years of Education: N/A   Occupational History  . Not on file.   Social History Main Topics  . Smoking status: Never Smoker   . Smokeless tobacco: Never Used  . Alcohol Use: No  .  Drug Use: No  . Sexual Activity: Yes    Birth Control/ Protection: Surgical     Comment: tubal ligation   Other Topics Concern  . Not on file   Social History Narrative   Review of Systems  Constitutional: Negative for fever and weight loss.  HENT: Negative for ear discharge, ear pain, hearing loss and tinnitus.   Eyes: Negative for blurred vision, double vision, photophobia and pain.  Respiratory: Negative for cough and shortness of breath.   Cardiovascular: Negative for chest pain and palpitations.  Gastrointestinal: Positive for heartburn. Negative for  nausea, vomiting, abdominal pain, diarrhea, constipation, blood in stool and melena.  Genitourinary: Negative for dysuria, urgency, frequency, hematuria and flank pain.  Musculoskeletal: Negative for falls.  Neurological: Negative for dizziness, loss of consciousness and headaches.  Endo/Heme/Allergies: Negative for environmental allergies.  Psychiatric/Behavioral: Negative for depression, suicidal ideas, hallucinations and substance abuse. The patient is not nervous/anxious and does not have insomnia.    BP 106/58 mmHg  Pulse 90  Temp(Src) 98 F (36.7 C) (Oral)  Ht _0  (1.626 m)  Wt 205 lb 9.6 oz (93.26 kg)  BMI 35.27 kg/m2  SpO2 98%  LMP 11/10/2011  Physical Exam  Constitutional: She is oriented to person, place, and time and well-developed, well-nourished, and in no distress.  HENT:  Head: Normocephalic and atraumatic.  Right Ear: Tympanic membrane, external ear and ear canal normal.  Left Ear: Tympanic membrane, external ear and ear canal normal.  Nose: Nose normal. No mucosal edema.  Mouth/Throat: Uvula is midline, oropharynx is clear and moist and mucous membranes are normal. No oropharyngeal exudate or posterior oropharyngeal erythema.  Eyes: Conjunctivae are normal. Pupils are equal, round, and reactive to light.  Neck: Neck supple. No thyromegaly present.  Cardiovascular: Normal rate, regular rhythm, normal heart sounds and intact distal pulses.   Pulmonary/Chest: Effort normal and breath sounds normal. No respiratory distress. She has no wheezes. She has no rales.  Abdominal: Soft. Bowel sounds are normal. She exhibits no distension and no mass. There is no tenderness. There is no rebound and no guarding.  Lymphadenopathy:    She has no cervical adenopathy.  Neurological: She is alert and oriented to person, place, and time. No cranial nerve deficit.  Skin: Skin is warm and dry. No rash noted.  Psychiatric: Affect normal.  Vitals reviewed.   Recent Results (from the  past 2160 hour(s))  CBC with Differential/Platelet     Status: None   Collection Time: 11/30/15  1:07 PM  Result Value Ref Range   WBC 6.4 4.0 - 10.5 K/uL   RBC 4.57 3.87 - 5.11 MIL/uL   Hemoglobin 12.8 12.0 - 15.0 g/dL   HCT 39.2 36.0 - 46.0 %   MCV 85.8 78.0 - 100.0 fL   MCH 28.0 26.0 - 34.0 pg   MCHC 32.7 30.0 - 36.0 g/dL   RDW 15.5 11.5 - 15.5 %   Platelets 216 150 - 400 K/uL   MPV 8.8 8.6 - 12.4 fL   Neutrophils Relative % 52 43 - 77 %   Neutro Abs 3.3 1.7 - 7.7 K/uL   Lymphocytes Relative 38 12 - 46 %   Lymphs Abs 2.4 0.7 - 4.0 K/uL   Monocytes Relative 8 3 - 12 %   Monocytes Absolute 0.5 0.1 - 1.0 K/uL   Eosinophils Relative 1 0 - 5 %   Eosinophils Absolute 0.1 0.0 - 0.7 K/uL   Basophils Relative 1 0 - 1 %  Basophils Absolute 0.1 0.0 - 0.1 K/uL   Smear Review Criteria for review not met   CA 125     Status: None   Collection Time: 11/30/15  1:07 PM  Result Value Ref Range   CA 125 9 <35 U/mL    Comment:   This test was performed using the Beckman Coulter chemiluminescent method.  Values obtained from different assay methods cannot be used interchangeably.  CA125 levels , regardless of value, should not be interpreted as absolute evidence of the presence or absence of disease.   Urinalysis with Culture Reflex     Status: Abnormal   Collection Time: 01/18/16 12:47 PM  Result Value Ref Range   Color, Urine YELLOW YELLOW    Comment: ** Please note change in unit of measure and reference range(s). **      APPearance CLEAR CLEAR   Specific Gravity, Urine 1.025 1.001 - 1.035   pH 5.5 5.0 - 8.0   Glucose, UA NEGATIVE NEGATIVE   Bilirubin Urine NEGATIVE NEGATIVE   Ketones, ur TRACE (A) NEGATIVE   Hgb urine dipstick NEGATIVE NEGATIVE   Protein, ur NEGATIVE NEGATIVE   Nitrite NEGATIVE NEGATIVE   Leukocytes, UA TRACE (A) NEGATIVE   WBC, UA 6-10 (A) <=5 WBC/HPF   RBC / HPF 0-2 <=2 RBC/HPF   Squamous Epithelial / LPF 20-40 (A) <=5 HPF   Bacteria, UA MANY (A) NONE  SEEN HPF   Crystals NONE SEEN NONE SEEN HPF   Casts NONE SEEN NONE SEEN LPF   Yeast NONE SEEN NONE SEEN HPF   Urine-Other SEE NOTE     Comment: Few mucous threads *URINE CULTURE PENDING*   Urine culture     Status: None   Collection Time: 01/18/16 12:47 PM  Result Value Ref Range   Colony Count NO GROWTH    Organism ID, Bacteria NO GROWTH   Comprehensive metabolic panel     Status: None   Collection Time: 01/18/16 12:50 PM  Result Value Ref Range   Sodium 137 135 - 145 mmol/L   Potassium 4.0 3.5 - 5.1 mmol/L   Chloride 104 101 - 111 mmol/L   CO2 22 22 - 32 mmol/L   Glucose, Bld 75 65 - 99 mg/dL   BUN 16 6 - 20 mg/dL   Creatinine, Ser 0.76 0.44 - 1.00 mg/dL   Calcium 9.5 8.9 - 10.3 mg/dL   Total Protein 7.6 6.5 - 8.1 g/dL   Albumin 4.5 3.5 - 5.0 g/dL   AST 18 15 - 41 U/L   ALT 19 14 - 54 U/L   Alkaline Phosphatase 49 38 - 126 U/L   Total Bilirubin 0.6 0.3 - 1.2 mg/dL   GFR calc non Af Amer >60 >60 mL/min   GFR calc Af Amer >60 >60 mL/min    Comment: (NOTE) The eGFR has been calculated using the CKD EPI equation. This calculation has not been validated in all clinical situations. eGFR's persistently <60 mL/min signify possible Chronic Kidney Disease.    Anion gap 11 5 - 15  CBC     Status: Abnormal   Collection Time: 01/18/16 12:50 PM  Result Value Ref Range   WBC 7.1 4.0 - 10.5 K/uL   RBC 4.55 3.87 - 5.11 MIL/uL   Hemoglobin 12.8 12.0 - 15.0 g/dL   HCT 39.7 36.0 - 46.0 %   MCV 87.3 78.0 - 100.0 fL   MCH 28.1 26.0 - 34.0 pg   MCHC 32.2 30.0 - 36.0 g/dL  RDW 15.8 (H) 11.5 - 15.5 %   Platelets 242 150 - 400 K/uL  Urinalysis, Routine w reflex microscopic (not at [facility])     Status: Abnormal   Collection Time: 01/18/16  3:24 PM  Result Value Ref Range   Color, Urine YELLOW YELLOW   APPearance CLEAR CLEAR   Specific Gravity, Urine 1.027 1.005 - 1.030   pH 5.5 5.0 - 8.0   Glucose, UA NEGATIVE NEGATIVE mg/dL   Hgb urine dipstick NEGATIVE NEGATIVE   Bilirubin Urine  NEGATIVE NEGATIVE   Ketones, ur 40 (A) NEGATIVE mg/dL   Protein, ur NEGATIVE NEGATIVE mg/dL   Nitrite NEGATIVE NEGATIVE   Leukocytes, UA NEGATIVE NEGATIVE    Comment: MICROSCOPIC NOT DONE ON URINES WITH NEGATIVE PROTEIN, BLOOD, LEUKOCYTES, NITRITE, OR GLUCOSE <1000 mg/dL.  Troponin I     Status: None   Collection Time: 02/01/16  8:23 AM  Result Value Ref Range   TNIDX 0.00 0.00 - 0.06 ug/l  H. pylori antibody, IgG     Status: None   Collection Time: 02/01/16  8:23 AM  Result Value Ref Range   H Pylori IgG Negative Negative  CBC     Status: Abnormal   Collection Time: 02/08/16  9:07 AM  Result Value Ref Range   WBC 5.3 4.0 - 10.5 K/uL   RBC 4.39 3.87 - 5.11 Mil/uL   Platelets 225.0 150.0 - 400.0 K/uL   Hemoglobin 12.3 12.0 - 15.0 g/dL   HCT 37.4 36.0 - 46.0 %   MCV 85.3 78.0 - 100.0 fl   MCHC 32.9 30.0 - 36.0 g/dL   RDW 17.1 (H) 11.5 - 15.5 %  Comp Met (CMET)     Status: None   Collection Time: 02/08/16  9:07 AM  Result Value Ref Range   Sodium 141 135 - 145 mEq/L   Potassium 3.9 3.5 - 5.1 mEq/L   Chloride 107 96 - 112 mEq/L   CO2 28 19 - 32 mEq/L   Glucose, Bld 73 70 - 99 mg/dL   BUN 15 6 - 23 mg/dL   Creatinine, Ser 0.84 0.40 - 1.20 mg/dL   Total Bilirubin 0.5 0.2 - 1.2 mg/dL   Alkaline Phosphatase 45 39 - 117 U/L   AST 12 0 - 37 U/L   ALT 13 0 - 35 U/L   Total Protein 6.8 6.0 - 8.3 g/dL   Albumin 4.1 3.5 - 5.2 g/dL   Calcium 9.5 8.4 - 10.5 mg/dL   GFR 93.22 >60.00 mL/min  TSH     Status: None   Collection Time: 02/08/16  9:07 AM  Result Value Ref Range   TSH 1.57 0.35 - 4.50 uIU/mL  Urinalysis, Routine w reflex microscopic     Status: Abnormal   Collection Time: 02/08/16  9:07 AM  Result Value Ref Range   Color, Urine YELLOW Yellow;Lt. Yellow   APPearance CLEAR Clear   Specific Gravity, Urine 1.015 1.000-1.030   pH 7.0 5.0 - 8.0   Total Protein, Urine NEGATIVE Negative   Urine Glucose NEGATIVE Negative   Ketones, ur NEGATIVE Negative   Bilirubin Urine  NEGATIVE Negative   Hgb urine dipstick NEGATIVE Negative   Urobilinogen, UA 0.2 0.0 - 1.0   Leukocytes, UA NEGATIVE Negative   Nitrite NEGATIVE Negative   WBC, UA 0-2/hpf 0-2/hpf   RBC / HPF none seen 0-2/hpf   Squamous Epithelial / LPF Rare(0-4/hpf) Rare(0-4/hpf)    Comment: Rare clue cells present   Bacteria, UA Rare(<10/hpf) (A) None  Lipid  Profile     Status: None   Collection Time: 02/08/16  9:07 AM  Result Value Ref Range   Cholesterol 143 0 - 200 mg/dL    Comment: ATP III Classification       Desirable:  < 200 mg/dL               Borderline High:  200 - 239 mg/dL          High:  > = 240 mg/dL   Triglycerides 46.0 0.0 - 149.0 mg/dL    Comment: Normal:  <150 mg/dLBorderline High:  150 - 199 mg/dL   HDL 40.10 >39.00 mg/dL   VLDL 9.2 0.0 - 40.0 mg/dL   LDL Cholesterol 93 0 - 99 mg/dL   Total CHOL/HDL Ratio 4     Comment:                Men          Women1/2 Average Risk     3.4          3.3Average Risk          5.0          4.42X Average Risk          9.6          7.13X Average Risk          15.0          11.0                       NonHDL 102.51     Comment: NOTE:  Non-HDL goal should be 30 mg/dL higher than patient's LDL goal (i.e. LDL goal of < 70 mg/dL, would have non-HDL goal of < 100 mg/dL)  Hemoglobin A1c     Status: None   Collection Time: 02/08/16  9:07 AM  Result Value Ref Range   Hgb A1c MFr Bld 5.7 4.6 - 6.5 %    Comment: Glycemic Control Guidelines for People with Diabetes:Non Diabetic:  <6%Goal of Therapy: <7%Additional Action Suggested:  >8%     Assessment/Plan: 1. Visit for preventive health examination Health Maintenance up-to-date. Depression screen negative. Will obtain labs today. - CBC - Comp Met (CMET) - TSH - Urinalysis, Routine w reflex microscopic - Lipid Profile - Hemoglobin A1c  2. Hyperlipidemia Tolerating statin. Will check fasting lipid panel today. - Lipid Profile  3. Essential hypertension BP lower end of normal despite being out of  medications for 3 days. Do not feel she needs combination ACEI-diuretic. Will Rx HCTZ 12.5 mg to take daily. FU scheduled. - Comp Met (CMET)  4. Gastroesophageal reflux disease without esophagitis Much improved. Will continue current regimen.

## 2016-02-28 ENCOUNTER — Other Ambulatory Visit: Payer: Self-pay | Admitting: Gynecology

## 2016-02-28 NOTE — Telephone Encounter (Signed)
Rx called in 

## 2016-02-28 NOTE — Telephone Encounter (Signed)
Last filled in Oct. 2016 with 1 refill

## 2016-02-29 ENCOUNTER — Encounter: Payer: Self-pay | Admitting: Physician Assistant

## 2016-02-29 ENCOUNTER — Ambulatory Visit (INDEPENDENT_AMBULATORY_CARE_PROVIDER_SITE_OTHER): Payer: BLUE CROSS/BLUE SHIELD | Admitting: Physician Assistant

## 2016-02-29 VITALS — BP 108/76 | HR 75 | Temp 98.2°F | Resp 16 | Ht 64.0 in | Wt 203.1 lb

## 2016-02-29 DIAGNOSIS — I10 Essential (primary) hypertension: Secondary | ICD-10-CM

## 2016-02-29 NOTE — Progress Notes (Signed)
Patient presents to clinic today for follow-up of hypertension after stopping her lisinopril-HCTZ and switching to HCTZ 12.5 mg daily due to low blood pressure at last visit. CBC checked that day was negative for anemia. Patient endorses taking new medication as directed. Patient denies chest pain, palpitations, lightheadedness, dizziness, vision changes or frequent headaches.  BP Readings from Last 3 Encounters:  02/29/16 108/76  02/08/16 106/58  02/01/16 120/68   Past Medical History  Diagnosis Date  . CIN I (cervical intraepithelial neoplasia I) 1989    cryo...also in 2008 .. had cryo   . Hypertension   . Anxiety   . Hyperlipidemia     Current Outpatient Prescriptions on File Prior to Visit  Medication Sig Dispense Refill  . ALPRAZolam (XANAX) 0.25 MG tablet TAKE 1 TABLET BY MOUTH EVERY EVENING AT BEDTIME AS NEEDED FOR ANXIETY 30 tablet 3  . atorvastatin (LIPITOR) 10 MG tablet Take 1 tablet (10 mg total) by mouth daily. 30 tablet 5  . celecoxib (CELEBREX) 100 MG capsule Take 1 capsule (100 mg total) by mouth 2 (two) times daily. 60 capsule 0  . esomeprazole (NEXIUM) 20 MG capsule Take 1 capsule (20 mg total) by mouth daily at 12 noon. 30 capsule 3  . hydrochlorothiazide (HYDRODIURIL) 12.5 MG tablet Take 1 tablet (12.5 mg total) by mouth daily. 30 tablet 3  . meclizine (ANTIVERT) 12.5 MG tablet Take 12.5 mg by mouth 3 (three) times daily as needed for dizziness.      No current facility-administered medications on file prior to visit.    No Known Allergies  Family History  Problem Relation Age of Onset  . Diabetes Brother   . Ovarian cancer Mother   . Ovarian cancer Paternal Aunt   . Heart disease Maternal Grandmother   . Breast cancer Maternal Grandmother     Age 43's  . Breast cancer Maternal Aunt     Age 82's    Social History   Social History  . Marital Status: Married    Spouse Name: N/A  . Number of Children: N/A  . Years of Education: N/A   Social  History Main Topics  . Smoking status: Never Smoker   . Smokeless tobacco: Never Used  . Alcohol Use: No  . Drug Use: No  . Sexual Activity: Yes    Birth Control/ Protection: Surgical     Comment: tubal ligation   Other Topics Concern  . None   Social History Narrative   Review of Systems - See HPI.  All other ROS are negative.  BP 108/76 mmHg  Pulse 75  Temp(Src) 98.2 F (36.8 C) (Oral)  Resp 16  Ht '5\' 4"'$  (1.626 m)  Wt 203 lb 2 oz (92.137 kg)  BMI 34.85 kg/m2  SpO2 99%  LMP 11/10/2011  Physical Exam  Constitutional: She is oriented to person, place, and time and well-developed, well-nourished, and in no distress.  HENT:  Head: Normocephalic and atraumatic.  Cardiovascular: Normal rate, regular rhythm, normal heart sounds and intact distal pulses.   Pulmonary/Chest: Effort normal and breath sounds normal. No respiratory distress. She has no wheezes. She has no rales. She exhibits no tenderness.  Neurological: She is alert and oriented to person, place, and time.  Skin: Skin is warm and dry. No rash noted.  Psychiatric: Affect normal.  Vitals reviewed.   Recent Results (from the past 2160 hour(s))  Urinalysis with Culture Reflex     Status: Abnormal   Collection Time: 01/18/16 12:47 PM  Result Value Ref Range   Color, Urine YELLOW YELLOW    Comment: ** Please note change in unit of measure and reference range(s). **      APPearance CLEAR CLEAR   Specific Gravity, Urine 1.025 1.001 - 1.035   pH 5.5 5.0 - 8.0   Glucose, UA NEGATIVE NEGATIVE   Bilirubin Urine NEGATIVE NEGATIVE   Ketones, ur TRACE (A) NEGATIVE   Hgb urine dipstick NEGATIVE NEGATIVE   Protein, ur NEGATIVE NEGATIVE   Nitrite NEGATIVE NEGATIVE   Leukocytes, UA TRACE (A) NEGATIVE   WBC, UA 6-10 (A) <=5 WBC/HPF   RBC / HPF 0-2 <=2 RBC/HPF   Squamous Epithelial / LPF 20-40 (A) <=5 HPF   Bacteria, UA MANY (A) NONE SEEN HPF   Crystals NONE SEEN NONE SEEN HPF   Casts NONE SEEN NONE SEEN LPF   Yeast  NONE SEEN NONE SEEN HPF   Urine-Other SEE NOTE     Comment: Few mucous threads *URINE CULTURE PENDING*   Urine culture     Status: None   Collection Time: 01/18/16 12:47 PM  Result Value Ref Range   Colony Count NO GROWTH    Organism ID, Bacteria NO GROWTH   Comprehensive metabolic panel     Status: None   Collection Time: 01/18/16 12:50 PM  Result Value Ref Range   Sodium 137 135 - 145 mmol/L   Potassium 4.0 3.5 - 5.1 mmol/L   Chloride 104 101 - 111 mmol/L   CO2 22 22 - 32 mmol/L   Glucose, Bld 75 65 - 99 mg/dL   BUN 16 6 - 20 mg/dL   Creatinine, Ser 0.76 0.44 - 1.00 mg/dL   Calcium 9.5 8.9 - 10.3 mg/dL   Total Protein 7.6 6.5 - 8.1 g/dL   Albumin 4.5 3.5 - 5.0 g/dL   AST 18 15 - 41 U/L   ALT 19 14 - 54 U/L   Alkaline Phosphatase 49 38 - 126 U/L   Total Bilirubin 0.6 0.3 - 1.2 mg/dL   GFR calc non Af Amer >60 >60 mL/min   GFR calc Af Amer >60 >60 mL/min    Comment: (NOTE) The eGFR has been calculated using the CKD EPI equation. This calculation has not been validated in all clinical situations. eGFR's persistently <60 mL/min signify possible Chronic Kidney Disease.    Anion gap 11 5 - 15  CBC     Status: Abnormal   Collection Time: 01/18/16 12:50 PM  Result Value Ref Range   WBC 7.1 4.0 - 10.5 K/uL   RBC 4.55 3.87 - 5.11 MIL/uL   Hemoglobin 12.8 12.0 - 15.0 g/dL   HCT 39.7 36.0 - 46.0 %   MCV 87.3 78.0 - 100.0 fL   MCH 28.1 26.0 - 34.0 pg   MCHC 32.2 30.0 - 36.0 g/dL   RDW 15.8 (H) 11.5 - 15.5 %   Platelets 242 150 - 400 K/uL  Urinalysis, Routine w reflex microscopic (not at [facility])     Status: Abnormal   Collection Time: 01/18/16  3:24 PM  Result Value Ref Range   Color, Urine YELLOW YELLOW   APPearance CLEAR CLEAR   Specific Gravity, Urine 1.027 1.005 - 1.030   pH 5.5 5.0 - 8.0   Glucose, UA NEGATIVE NEGATIVE mg/dL   Hgb urine dipstick NEGATIVE NEGATIVE   Bilirubin Urine NEGATIVE NEGATIVE   Ketones, ur 40 (A) NEGATIVE mg/dL   Protein, ur NEGATIVE NEGATIVE  mg/dL   Nitrite NEGATIVE NEGATIVE   Leukocytes,  UA NEGATIVE NEGATIVE    Comment: MICROSCOPIC NOT DONE ON URINES WITH NEGATIVE PROTEIN, BLOOD, LEUKOCYTES, NITRITE, OR GLUCOSE <1000 mg/dL.  Troponin I     Status: None   Collection Time: 02/01/16  8:23 AM  Result Value Ref Range   TNIDX 0.00 0.00 - 0.06 ug/l  H. pylori antibody, IgG     Status: None   Collection Time: 02/01/16  8:23 AM  Result Value Ref Range   H Pylori IgG Negative Negative  CBC     Status: Abnormal   Collection Time: 02/08/16  9:07 AM  Result Value Ref Range   WBC 5.3 4.0 - 10.5 K/uL   RBC 4.39 3.87 - 5.11 Mil/uL   Platelets 225.0 150.0 - 400.0 K/uL   Hemoglobin 12.3 12.0 - 15.0 g/dL   HCT 37.4 36.0 - 46.0 %   MCV 85.3 78.0 - 100.0 fl   MCHC 32.9 30.0 - 36.0 g/dL   RDW 17.1 (H) 11.5 - 15.5 %  Comp Met (CMET)     Status: None   Collection Time: 02/08/16  9:07 AM  Result Value Ref Range   Sodium 141 135 - 145 mEq/L   Potassium 3.9 3.5 - 5.1 mEq/L   Chloride 107 96 - 112 mEq/L   CO2 28 19 - 32 mEq/L   Glucose, Bld 73 70 - 99 mg/dL   BUN 15 6 - 23 mg/dL   Creatinine, Ser 0.84 0.40 - 1.20 mg/dL   Total Bilirubin 0.5 0.2 - 1.2 mg/dL   Alkaline Phosphatase 45 39 - 117 U/L   AST 12 0 - 37 U/L   ALT 13 0 - 35 U/L   Total Protein 6.8 6.0 - 8.3 g/dL   Albumin 4.1 3.5 - 5.2 g/dL   Calcium 9.5 8.4 - 10.5 mg/dL   GFR 93.22 >60.00 mL/min  TSH     Status: None   Collection Time: 02/08/16  9:07 AM  Result Value Ref Range   TSH 1.57 0.35 - 4.50 uIU/mL  Urinalysis, Routine w reflex microscopic     Status: Abnormal   Collection Time: 02/08/16  9:07 AM  Result Value Ref Range   Color, Urine YELLOW Yellow;Lt. Yellow   APPearance CLEAR Clear   Specific Gravity, Urine 1.015 1.000-1.030   pH 7.0 5.0 - 8.0   Total Protein, Urine NEGATIVE Negative   Urine Glucose NEGATIVE Negative   Ketones, ur NEGATIVE Negative   Bilirubin Urine NEGATIVE Negative   Hgb urine dipstick NEGATIVE Negative   Urobilinogen, UA 0.2 0.0 - 1.0    Leukocytes, UA NEGATIVE Negative   Nitrite NEGATIVE Negative   WBC, UA 0-2/hpf 0-2/hpf   RBC / HPF none seen 0-2/hpf   Squamous Epithelial / LPF Rare(0-4/hpf) Rare(0-4/hpf)    Comment: Rare clue cells present   Bacteria, UA Rare(<10/hpf) (A) None  Lipid Profile     Status: None   Collection Time: 02/08/16  9:07 AM  Result Value Ref Range   Cholesterol 143 0 - 200 mg/dL    Comment: ATP III Classification       Desirable:  < 200 mg/dL               Borderline High:  200 - 239 mg/dL          High:  > = 240 mg/dL   Triglycerides 46.0 0.0 - 149.0 mg/dL    Comment: Normal:  <150 mg/dLBorderline High:  150 - 199 mg/dL   HDL 40.10 >39.00 mg/dL   VLDL 9.2  0.0 - 40.0 mg/dL   LDL Cholesterol 93 0 - 99 mg/dL   Total CHOL/HDL Ratio 4     Comment:                Men          Women1/2 Average Risk     3.4          3.3Average Risk          5.0          4.42X Average Risk          9.6          7.13X Average Risk          15.0          11.0                       NonHDL 102.51     Comment: NOTE:  Non-HDL goal should be 30 mg/dL higher than patient's LDL goal (i.e. LDL goal of < 70 mg/dL, would have non-HDL goal of < 100 mg/dL)  Hemoglobin A1c     Status: None   Collection Time: 02/08/16  9:07 AM  Result Value Ref Range   Hgb A1c MFr Bld 5.7 4.6 - 6.5 %    Comment: Glycemic Control Guidelines for People with Diabetes:Non Diabetic:  <6%Goal of Therapy: <7%Additional Action Suggested:  >8%    Assessment/Plan: No problem-specific assessment & plan notes found for this encounter.

## 2016-02-29 NOTE — Patient Instructions (Signed)
Your BP looks good today. Please continue the Hydrochlorothiazide as directed. Increase exercise to a goal of 150 minutes per week.  Follow-up with me in 6 months. Return sooner if needed.  Consider starting a B-complex supplement daily.  DASH Eating Plan DASH stands for "Dietary Approaches to Stop Hypertension." The DASH eating plan is a healthy eating plan that has been shown to reduce high blood pressure (hypertension). Additional health benefits may include reducing the risk of type 2 diabetes mellitus, heart disease, and stroke. The DASH eating plan may also help with weight loss. WHAT DO I NEED TO KNOW ABOUT THE DASH EATING PLAN? For the DASH eating plan, you will follow these general guidelines:  Choose foods with a percent daily value for sodium of less than 5% (as listed on the food label).  Use salt-free seasonings or herbs instead of table salt or sea salt.  Check with your health care provider or pharmacist before using salt substitutes.  Eat lower-sodium products, often labeled as "lower sodium" or "no salt added."  Eat fresh foods.  Eat more vegetables, fruits, and low-fat dairy products.  Choose whole grains. Look for the word "whole" as the first word in the ingredient list.  Choose fish and skinless chicken or [place] more often than red meat. Limit fish, poultry, and meat to 6 oz (170 g) each day.  Limit sweets, desserts, sugars, and sugary drinks.  Choose heart-healthy fats.  Limit cheese to 1 oz (28 g) per day.  Eat more home-cooked food and less restaurant, buffet, and fast food.  Limit fried foods.  Cook foods using methods other than frying.  Limit canned vegetables. If you do use them, rinse them well to decrease the sodium.  When eating at a restaurant, ask that your food be prepared with less salt, or no salt if possible. WHAT FOODS CAN I EAT? Seek help from a dietitian for individual calorie needs. Grains Whole grain or whole wheat bread.  Brown rice. Whole grain or whole wheat pasta. Quinoa, bulgur, and whole grain cereals. Low-sodium cereals. Corn or whole wheat flour tortillas. Whole grain cornbread. Whole grain crackers. Low-sodium crackers. Vegetables Fresh or frozen vegetables (raw, steamed, roasted, or grilled). Low-sodium or reduced-sodium tomato and vegetable juices. Low-sodium or reduced-sodium tomato sauce and paste. Low-sodium or reduced-sodium canned vegetables.  Fruits All fresh, canned (in natural juice), or frozen fruits. Meat and Other Protein Products Ground beef (85% or leaner), grass-fed beef, or beef trimmed of fat. Skinless chicken or [place]. Ground chicken or [place]. Pork trimmed of fat. All fish and seafood. Eggs. Dried beans, peas, or lentils. Unsalted nuts and seeds. Unsalted canned beans. Dairy Low-fat dairy products, such as skim or 1% milk, 2% or reduced-fat cheeses, low-fat ricotta or cottage cheese, or plain low-fat yogurt. Low-sodium or reduced-sodium cheeses. Fats and Oils Tub margarines without trans fats. Light or reduced-fat mayonnaise and salad dressings (reduced sodium). Avocado. Safflower, olive, or canola oils. Natural peanut or almond butter. Other Unsalted popcorn and pretzels. The items listed above may not be a complete list of recommended foods or beverages. Contact your dietitian for more options. WHAT FOODS ARE NOT RECOMMENDED? Grains White bread. White pasta. White rice. Refined cornbread. Bagels and croissants. Crackers that contain trans fat. Vegetables Creamed or fried vegetables. Vegetables in a cheese sauce. Regular canned vegetables. Regular canned tomato sauce and paste. Regular tomato and vegetable juices. Fruits Dried fruits. Canned fruit in light or heavy syrup. Fruit juice. Meat and Other Protein Products Fatty cuts of  meat. Ribs, chicken wings, bacon, sausage, bologna, salami, chitterlings, fatback, hot dogs, bratwurst, and packaged luncheon meats. Salted nuts and seeds.  Canned beans with salt. Dairy Whole or 2% milk, cream, half-and-half, and cream cheese. Whole-fat or sweetened yogurt. Full-fat cheeses or blue cheese. Nondairy creamers and whipped toppings. Processed cheese, cheese spreads, or cheese curds. Condiments Onion and garlic salt, seasoned salt, table salt, and sea salt. Canned and packaged gravies. Worcestershire sauce. Tartar sauce. Barbecue sauce. Teriyaki sauce. Soy sauce, including reduced sodium. Steak sauce. Fish sauce. Oyster sauce. Cocktail sauce. Horseradish. Ketchup and mustard. Meat flavorings and tenderizers. Bouillon cubes. Hot sauce. Tabasco sauce. Marinades. Taco seasonings. Relishes. Fats and Oils Butter, stick margarine, lard, shortening, ghee, and bacon fat. Coconut, palm kernel, or palm oils. Regular salad dressings. Other Pickles and olives. Salted popcorn and pretzels. The items listed above may not be a complete list of foods and beverages to avoid. Contact your dietitian for more information. WHERE CAN I FIND MORE INFORMATION? National [employer], [employer], and [employer] Institute: travelstabloid.com   This information is not intended to replace advice given to you by your health care provider. Make sure you discuss any questions you have with your health care provider.   Document Released: 10/04/2011 Document Revised: 11/05/2014 Document Reviewed: 08/19/2013 Elsevier Interactive Patient Education [employer].

## 2016-02-29 NOTE — Assessment & Plan Note (Signed)
BP improved. Tolerating medication well. Will continue current regimen. FU 6 months.

## 2016-04-16 ENCOUNTER — Ambulatory Visit (INDEPENDENT_AMBULATORY_CARE_PROVIDER_SITE_OTHER): Payer: BLUE CROSS/BLUE SHIELD | Admitting: Physician Assistant

## 2016-04-16 ENCOUNTER — Encounter: Payer: Self-pay | Admitting: Physician Assistant

## 2016-04-16 VITALS — BP 128/96 | HR 100 | Temp 98.2°F | Resp 16 | Ht 64.0 in | Wt 206.1 lb

## 2016-04-16 DIAGNOSIS — S93402A Sprain of unspecified ligament of left ankle, initial encounter: Secondary | ICD-10-CM | POA: Diagnosis not present

## 2016-04-16 MED ORDER — CELECOXIB 100 MG PO CAPS: 100.0000 mg | ORAL_CAPSULE | Freq: Two times a day (BID) | ORAL | Status: DC

## 2016-04-16 NOTE — Patient Instructions (Signed)
Please wear ACE wrap as directed. Keep foot elevated while resting. Wear supportive footwear.  Take Celebrex as directed with food over the next 4-5 days. Please let me know if symptoms are not resolving.

## 2016-04-16 NOTE — Progress Notes (Signed)
Patient presents to clinic today c/o 2 days of left foot and ankle pain, mostly lateral and radiating to the middle of plantar surface. Denies trauma or injury. Denies numbness or tingling. Endorses some mild swelling of foot that has resolved. Denies history of similar symptoms. Denies symptoms of R foot. Has taken OTC advil with only some relief in symptoms.  Past Medical History  Diagnosis Date  . CIN I (cervical intraepithelial neoplasia I) 1989    cryo...also in 2008 .. had cryo   . Hypertension   . Anxiety   . Hyperlipidemia     Current Outpatient Prescriptions on File Prior to Visit  Medication Sig Dispense Refill  . ALPRAZolam (XANAX) 0.25 MG tablet TAKE 1 TABLET BY MOUTH EVERY EVENING AT BEDTIME AS NEEDED FOR ANXIETY 30 tablet 3  . atorvastatin (LIPITOR) 10 MG tablet Take 1 tablet (10 mg total) by mouth daily. 30 tablet 5  . celecoxib (CELEBREX) 100 MG capsule Take 1 capsule (100 mg total) by mouth 2 (two) times daily. 60 capsule 0  . esomeprazole (NEXIUM) 20 MG capsule Take 1 capsule (20 mg total) by mouth daily at 12 noon. 30 capsule 3  . hydrochlorothiazide (HYDRODIURIL) 12.5 MG tablet Take 1 tablet (12.5 mg total) by mouth daily. 30 tablet 3  . meclizine (ANTIVERT) 12.5 MG tablet Take 12.5 mg by mouth 3 (three) times daily as needed for dizziness.     . Multiple Vitamins-Minerals (WOMENS MULTIVITAMIN PLUS) TABS Take by mouth daily.     No current facility-administered medications on file prior to visit.    No Known Allergies  Family History  Problem Relation Age of Onset  . Diabetes Brother   . Ovarian cancer Mother   . Ovarian cancer Paternal Aunt   . Heart disease Maternal Grandmother   . Breast cancer Maternal Grandmother     Age 68's  . Breast cancer Maternal Aunt     Age 53's    Social History   Social History  . Marital Status: Married    Spouse Name: N/A  . Number of Children: N/A  . Years of Education: N/A   Social History Main Topics  .  Smoking status: Never Smoker   . Smokeless tobacco: Never Used  . Alcohol Use: No  . Drug Use: No  . Sexual Activity: Yes    Birth Control/ Protection: Surgical     Comment: tubal ligation   Other Topics Concern  . Not on file   Social History Narrative   Review of Systems - See HPI.  All other ROS are negative.  BP 128/96 mmHg  Pulse 100  Temp(Src) 98.2 F (36.8 C) (Oral)  Resp 16  Ht '5\' 4"'  (1.626 m)  Wt 206 lb 2 oz (93.498 kg)  BMI 35.36 kg/m2  SpO2 100%  LMP 11/10/2011  Physical Exam  Constitutional: She is oriented to person, place, and time and well-developed, well-nourished, and in no distress.  HENT:  Head: Normocephalic and atraumatic.  Eyes: Conjunctivae are normal.  Cardiovascular: Normal rate, regular rhythm, normal heart sounds and intact distal pulses.   Pulmonary/Chest: Effort normal and breath sounds normal. No respiratory distress. She has no wheezes. She has no rales. She exhibits no tenderness.  Musculoskeletal:       Right ankle: She exhibits normal range of motion and no swelling. Tenderness. AITFL tenderness found. No lateral malleolus and no medial malleolus tenderness found. Achilles tendon normal.  Neurological: She is alert and oriented to person, place, and  time.  Skin: Skin is warm and dry.  Vitals reviewed.   Recent Results (from the past 2160 hour(s))  Urinalysis with Culture Reflex     Status: Abnormal   Collection Time: 01/18/16 12:47 PM  Result Value Ref Range   Color, Urine YELLOW YELLOW    Comment: ** Please note change in unit of measure and reference range(s). **      APPearance CLEAR CLEAR   Specific Gravity, Urine 1.025 1.001 - 1.035   pH 5.5 5.0 - 8.0   Glucose, UA NEGATIVE NEGATIVE   Bilirubin Urine NEGATIVE NEGATIVE   Ketones, ur TRACE (A) NEGATIVE   Hgb urine dipstick NEGATIVE NEGATIVE   Protein, ur NEGATIVE NEGATIVE   Nitrite NEGATIVE NEGATIVE   Leukocytes, UA TRACE (A) NEGATIVE   WBC, UA 6-10 (A) <=5 WBC/HPF   RBC  / HPF 0-2 <=2 RBC/HPF   Squamous Epithelial / LPF 20-40 (A) <=5 HPF   Bacteria, UA MANY (A) NONE SEEN HPF   Crystals NONE SEEN NONE SEEN HPF   Casts NONE SEEN NONE SEEN LPF   Yeast NONE SEEN NONE SEEN HPF   Urine-Other SEE NOTE     Comment: Few mucous threads *URINE CULTURE PENDING*   Urine culture     Status: None   Collection Time: 01/18/16 12:47 PM  Result Value Ref Range   Colony Count NO GROWTH    Organism ID, Bacteria NO GROWTH   Comprehensive metabolic panel     Status: None   Collection Time: 01/18/16 12:50 PM  Result Value Ref Range   Sodium 137 135 - 145 mmol/L   Potassium 4.0 3.5 - 5.1 mmol/L   Chloride 104 101 - 111 mmol/L   CO2 22 22 - 32 mmol/L   Glucose, Bld 75 65 - 99 mg/dL   BUN 16 6 - 20 mg/dL   Creatinine, Ser 0.76 0.44 - 1.00 mg/dL   Calcium 9.5 8.9 - 10.3 mg/dL   Total Protein 7.6 6.5 - 8.1 g/dL   Albumin 4.5 3.5 - 5.0 g/dL   AST 18 15 - 41 U/L   ALT 19 14 - 54 U/L   Alkaline Phosphatase 49 38 - 126 U/L   Total Bilirubin 0.6 0.3 - 1.2 mg/dL   GFR calc non Af Amer >60 >60 mL/min   GFR calc Af Amer >60 >60 mL/min    Comment: (NOTE) The eGFR has been calculated using the CKD EPI equation. This calculation has not been validated in all clinical situations. eGFR's persistently <60 mL/min signify possible Chronic Kidney Disease.    Anion gap 11 5 - 15  CBC     Status: Abnormal   Collection Time: 01/18/16 12:50 PM  Result Value Ref Range   WBC 7.1 4.0 - 10.5 K/uL   RBC 4.55 3.87 - 5.11 MIL/uL   Hemoglobin 12.8 12.0 - 15.0 g/dL   HCT 39.7 36.0 - 46.0 %   MCV 87.3 78.0 - 100.0 fL   MCH 28.1 26.0 - 34.0 pg   MCHC 32.2 30.0 - 36.0 g/dL   RDW 15.8 (H) 11.5 - 15.5 %   Platelets 242 150 - 400 K/uL  Urinalysis, Routine w reflex microscopic (not at [facility])     Status: Abnormal   Collection Time: 01/18/16  3:24 PM  Result Value Ref Range   Color, Urine YELLOW YELLOW   APPearance CLEAR CLEAR   Specific Gravity, Urine 1.027 1.005 - 1.030   pH 5.5 5.0 - 8.0     Glucose,  UA NEGATIVE NEGATIVE mg/dL   Hgb urine dipstick NEGATIVE NEGATIVE   Bilirubin Urine NEGATIVE NEGATIVE   Ketones, ur 40 (A) NEGATIVE mg/dL   Protein, ur NEGATIVE NEGATIVE mg/dL   Nitrite NEGATIVE NEGATIVE   Leukocytes, UA NEGATIVE NEGATIVE    Comment: MICROSCOPIC NOT DONE ON URINES WITH NEGATIVE PROTEIN, BLOOD, LEUKOCYTES, NITRITE, OR GLUCOSE <1000 mg/dL.  Troponin I     Status: None   Collection Time: 02/01/16  8:23 AM  Result Value Ref Range   TNIDX 0.00 0.00 - 0.06 ug/l  H. pylori antibody, IgG     Status: None   Collection Time: 02/01/16  8:23 AM  Result Value Ref Range   H Pylori IgG Negative Negative  CBC     Status: Abnormal   Collection Time: 02/08/16  9:07 AM  Result Value Ref Range   WBC 5.3 4.0 - 10.5 K/uL   RBC 4.39 3.87 - 5.11 Mil/uL   Platelets 225.0 150.0 - 400.0 K/uL   Hemoglobin 12.3 12.0 - 15.0 g/dL   HCT 37.4 36.0 - 46.0 %   MCV 85.3 78.0 - 100.0 fl   MCHC 32.9 30.0 - 36.0 g/dL   RDW 17.1 (H) 11.5 - 15.5 %  Comp Met (CMET)     Status: None   Collection Time: 02/08/16  9:07 AM  Result Value Ref Range   Sodium 141 135 - 145 mEq/L   Potassium 3.9 3.5 - 5.1 mEq/L   Chloride 107 96 - 112 mEq/L   CO2 28 19 - 32 mEq/L   Glucose, Bld 73 70 - 99 mg/dL   BUN 15 6 - 23 mg/dL   Creatinine, Ser 0.84 0.40 - 1.20 mg/dL   Total Bilirubin 0.5 0.2 - 1.2 mg/dL   Alkaline Phosphatase 45 39 - 117 U/L   AST 12 0 - 37 U/L   ALT 13 0 - 35 U/L   Total Protein 6.8 6.0 - 8.3 g/dL   Albumin 4.1 3.5 - 5.2 g/dL   Calcium 9.5 8.4 - 10.5 mg/dL   GFR 93.22 >60.00 mL/min  TSH     Status: None   Collection Time: 02/08/16  9:07 AM  Result Value Ref Range   TSH 1.57 0.35 - 4.50 uIU/mL  Urinalysis, Routine w reflex microscopic     Status: Abnormal   Collection Time: 02/08/16  9:07 AM  Result Value Ref Range   Color, Urine YELLOW Yellow;Lt. Yellow   APPearance CLEAR Clear   Specific Gravity, Urine 1.015 1.000-1.030   pH 7.0 5.0 - 8.0   Total Protein, Urine NEGATIVE  Negative   Urine Glucose NEGATIVE Negative   Ketones, ur NEGATIVE Negative   Bilirubin Urine NEGATIVE Negative   Hgb urine dipstick NEGATIVE Negative   Urobilinogen, UA 0.2 0.0 - 1.0   Leukocytes, UA NEGATIVE Negative   Nitrite NEGATIVE Negative   WBC, UA 0-2/hpf 0-2/hpf   RBC / HPF none seen 0-2/hpf   Squamous Epithelial / LPF Rare(0-4/hpf) Rare(0-4/hpf)    Comment: Rare clue cells present   Bacteria, UA Rare(<10/hpf) (A) None  Lipid Profile     Status: None   Collection Time: 02/08/16  9:07 AM  Result Value Ref Range   Cholesterol 143 0 - 200 mg/dL    Comment: ATP III Classification       Desirable:  < 200 mg/dL               Borderline High:  200 - 239 mg/dL  High:  > = 240 mg/dL   Triglycerides 46.0 0.0 - 149.0 mg/dL    Comment: Normal:  <150 mg/dLBorderline High:  150 - 199 mg/dL   HDL 40.10 >39.00 mg/dL   VLDL 9.2 0.0 - 40.0 mg/dL   LDL Cholesterol 93 0 - 99 mg/dL   Total CHOL/HDL Ratio 4     Comment:                Men          Women1/2 Average Risk     3.4          3.3Average Risk          5.0          4.42X Average Risk          9.6          7.13X Average Risk          15.0          11.0                       NonHDL 102.51     Comment: NOTE:  Non-HDL goal should be 30 mg/dL higher than patient's LDL goal (i.e. LDL goal of < 70 mg/dL, would have non-HDL goal of < 100 mg/dL)  Hemoglobin A1c     Status: None   Collection Time: 02/08/16  9:07 AM  Result Value Ref Range   Hgb A1c MFr Bld 5.7 4.6 - 6.5 %    Comment: Glycemic Control Guidelines for People with Diabetes:Non Diabetic:  <6%Goal of Therapy: <7%Additional Action Suggested:  >8%    Assessment/Plan: 1. Left ankle sprain, initial encounter Mild. Can ambulate. Good ROM. Pain over AITFL. No swelling noted. ACE wrap applied for compression. NSAID as directed. Supportive measures reviewed. FU if symptoms are not resolving.  - celecoxib (CELEBREX) 100 MG capsule; Take 1 capsule (100 mg total) by mouth 2 (two) times  daily.  Dispense: 60 capsule; Refill: 0   Leeanne Rio, [place]

## 2016-05-07 ENCOUNTER — Telehealth: Payer: Self-pay | Admitting: *Deleted

## 2016-05-07 ENCOUNTER — Other Ambulatory Visit: Payer: Self-pay | Admitting: Physician Assistant

## 2016-05-07 DIAGNOSIS — R1013 Epigastric pain: Secondary | ICD-10-CM

## 2016-05-07 MED ORDER — ESOMEPRAZOLE MAGNESIUM 20 MG PO CPDR: 20.0000 mg | DELAYED_RELEASE_CAPSULE | Freq: Every day | ORAL | Status: DC

## 2016-05-07 MED ORDER — HYDROCHLOROTHIAZIDE 12.5 MG PO TABS: 12.5000 mg | ORAL_TABLET | Freq: Every day | ORAL | Status: DC

## 2016-05-07 NOTE — Telephone Encounter (Signed)
90-day Rx request to pharmacy/SL[facility]

## 2016-05-07 NOTE — Telephone Encounter (Signed)
This has been taken care of.

## 2016-05-07 NOTE — Telephone Encounter (Signed)
Relation to [id number] Call back number: [phone]  Pharmacy: [id number] - [place], [place] - [address] AT [place]  Reason for call:  Patient requesting a 90 day supply regarding the following medication listed below. Patient states the medication is cheaper. Please advise

## 2016-05-08 ENCOUNTER — Encounter: Payer: Self-pay | Admitting: Physician Assistant

## 2016-05-14 ENCOUNTER — Encounter: Payer: Self-pay | Admitting: Physician Assistant

## 2016-05-21 ENCOUNTER — Emergency Department (HOSPITAL_BASED_OUTPATIENT_CLINIC_OR_DEPARTMENT_OTHER)
Admission: EM | Admit: 2016-05-21 | Discharge: 2016-05-21 | Disposition: A | Discharge status: Home / Self Care | Payer: BLUE CROSS/BLUE SHIELD | Attending: Emergency Medicine | Admitting: Emergency Medicine

## 2016-05-21 ENCOUNTER — Encounter (HOSPITAL_BASED_OUTPATIENT_CLINIC_OR_DEPARTMENT_OTHER): Payer: Self-pay | Admitting: *Deleted

## 2016-05-21 DIAGNOSIS — E785 Hyperlipidemia, unspecified: Secondary | ICD-10-CM | POA: Diagnosis not present

## 2016-05-21 DIAGNOSIS — R300 Dysuria: Secondary | ICD-10-CM | POA: Insufficient documentation

## 2016-05-21 DIAGNOSIS — Z79899 Other long term (current) drug therapy: Secondary | ICD-10-CM | POA: Diagnosis not present

## 2016-05-21 DIAGNOSIS — R110 Nausea: Secondary | ICD-10-CM | POA: Diagnosis not present

## 2016-05-21 DIAGNOSIS — R002 Palpitations: Secondary | ICD-10-CM

## 2016-05-21 DIAGNOSIS — I10 Essential (primary) hypertension: Secondary | ICD-10-CM | POA: Diagnosis not present

## 2016-05-21 LAB — URINALYSIS, ROUTINE W REFLEX MICROSCOPIC
BILIRUBIN URINE: NEGATIVE
GLUCOSE, UA: NEGATIVE mg/dL
Hgb urine dipstick: NEGATIVE
KETONES UR: NEGATIVE mg/dL
NITRITE: NEGATIVE
PH: 6 (ref 5.0–8.0)
PROTEIN: NEGATIVE mg/dL
SPECIFIC GRAVITY, URINE: 1.014 (ref 1.005–1.030)

## 2016-05-21 LAB — CBC
HEMATOCRIT: 39.1 % (ref 36.0–46.0)
HEMOGLOBIN: 12.8 g/dL (ref 12.0–15.0)
MCH: 27.8 pg (ref 26.0–34.0)
MCHC: 32.7 g/dL (ref 30.0–36.0)
MCV: 84.8 fL (ref 78.0–100.0)
Platelets: 250 10*3/uL (ref 150–400)
RBC: 4.61 MIL/uL (ref 3.87–5.11)
RDW: 15 % (ref 11.5–15.5)
WBC: 5.5 10*3/uL (ref 4.0–10.5)

## 2016-05-21 LAB — BASIC METABOLIC PANEL
ANION GAP: 8 (ref 5–15)
BUN: 12 mg/dL (ref 6–20)
CHLORIDE: 100 mmol/L — AB (ref 101–111)
CO2: 28 mmol/L (ref 22–32)
Calcium: 9 mg/dL (ref 8.9–10.3)
Creatinine, Ser: 0.71 mg/dL (ref 0.44–1.00)
GFR calc Af Amer: >60 mL/min (ref >60)
GLUCOSE: 91 mg/dL (ref 65–99)
POTASSIUM: 3.8 mmol/L (ref 3.5–5.1)
SODIUM: 136 mmol/L (ref 135–145)

## 2016-05-21 LAB — URINE MICROSCOPIC-ADD ON: RBC / HPF: NONE SEEN RBC/hpf (ref 0–5)

## 2016-05-21 LAB — TSH: TSH: 1.854 u[IU]/mL (ref 0.350–4.500)

## 2016-05-21 LAB — TROPONIN I: Troponin I: <0.03 ng/mL (ref <0.03)

## 2016-05-21 MED ORDER — SODIUM CHLORIDE 0.9 % IV BOLUS (SEPSIS)
500.0000 mL | Freq: Once | INTRAVENOUS | Status: AC
  Administered 2016-05-21: 500 mL via INTRAVENOUS

## 2016-05-21 NOTE — ED Triage Notes (Signed)
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 Pt states she has hx of vertigo dx "years ago" and she takes meclizine every day, "I know I'm only supposed to take it when needed, but I feel dizzy every day, so I take it every day." pt reports some pain to her left inner chest area, tender to palp on assessment. ekg performed while pt being triaged.

## 2016-05-21 NOTE — ED Provider Notes (Signed)
Patient seen and evaluated. Discussed with Dr. Ola Spurr.  She reports episode of palpitations while at work. She works putting the lens on rubber hoses. Reports episode this morning where she looked at her device on her wrist and read her heart rate is 145. Symptoms are rather short-lived and resolve spontaneously. Reports some fleeting chest pain after arrival here but no pain that started episode. This does not somewhat panic attack per she states she got anxious after it happened prior to. TSH is pending. Remainder of her labs are reassuring EKG shows sinus rhythm without ectopy or preexcitation.   Exam is normal. She has no bruits. She has no abnormal heart sounds or gallops. Nothing to indicate abnormal heart structure. No history of mitral valve disease or rheumatic fever.   She is referred to cardiology.  We'll contact her TSH is abnormal.  Discussion with the rate at 140 this would be a bit fast for sinus tachycardia and she has nothing to precipitate an episode of sinus tach. If Atrial  fibrillation, her chads score is low. She was directed to take a full aspirin until cardialogy follow-up. ER with recurrence.   Tanna Furry, MD 05/21/16 1149

## 2016-05-21 NOTE — ED Provider Notes (Signed)
[facility] Provider Note   CSN: [id number] Arrival date & time: 05/21/16  [id number]  First Provider Contact:  [phone]     History   Chief Complaint Chief Complaint  Patient presents with  . Palpitations    HPI Madeline Hawkins is a 50 y.o. female. She presents due to palpitations that began at work today. She says she saw her HR in the 140s on her FitBit that only came down to 119. She says this has happened before but has never lasted this long. The sensation of palpitations was mostly gone by the time she got to the ED. She denies vision changes, nausea, or vomiting. She initially denied chest pain but later indicated she had centralized chest pain that gets worse when she presses on her sternum. She reports chronic dizziness for which she takes meclizine nightly. She has had decreased appetite for the last week and had contacted her doctor who recommended drinking plenty of fluids. She says she drinks about 3 bottles of water/fluids daily and does not drink soda. 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 She also has had frequent dysuria for the last few months. No n/v/d. She also complains of right-sided facial twitching over the weekend.   HPI  Past Medical History:  Diagnosis Date  . Anxiety   . CIN I (cervical intraepithelial neoplasia I) 1989   cryo...also in 2008 .. had cryo   . Hyperlipidemia   . Hypertension     Patient Active Problem List   Diagnosis Date Noted  . Visit for preventive health examination 02/08/2016  . GERD (gastroesophageal reflux disease) 02/01/2016  . Right hip pain 02/01/2016  . Ovarian cyst, right 11/30/2015  . Adjustment disorder with depressed mood 08/05/2015  . Hyperlipidemia 11/14/2012  . HTN (hypertension) 02/04/2012  . Anxiety 02/04/2012  . Anemia 11/13/2011    Past Surgical History:  Procedure Laterality Date  . ABDOMINAL HYSTERECTOMY  11/22/2011   Procedure: HYSTERECTOMY ABDOMINAL;   Surgeon: Terrance Mass, MD;  Location: [facility];  Service: Gynecology;  Laterality: N/A;  . colon polyps removed    . GYNECOLOGIC CRYOSURGERY  07/07/2007  . LAPAROSCOPIC LYSIS OF ADHESIONS  06/28/2015   Procedure: LAPAROSCOPIC LYSIS OF ABDOMINAL PELVIC ADHESIONS;  Surgeon: Terrance Mass, MD;  Location: [facility];  Service: Gynecology;;  . LAPAROSCOPIC UNILATERAL SALPINGO OOPHERECTOMY Right 06/28/2015   Procedure: LAPAROSCOPIC RIGHT SALPINGO OOPHORECTOMY;  Surgeon: Terrance Mass, MD;  Location: [facility];  Service: Gynecology;  Laterality: Right;  . LAPAROSCOPY  11/22/2011   Procedure: LAPAROSCOPY DIAGNOSTIC;  Surgeon: Terrance Mass, MD;  Location: [facility];  Service: Gynecology;  Laterality: N/A;  . OVARIAN CYST REMOVAL  11/22/2011   Procedure: OVARIAN CYSTECTOMY;  Surgeon: Terrance Mass, MD;  Location: [facility];  Service: Gynecology;  Laterality: Left;  . SALPINGECTOMY     left for ectopic pregnancy  . TUBAL LIGATION     right tubal ligation    OB History    Gravida Para Term Preterm AB Living   5 4 4   1 4    SAB TAB Ectopic Multiple Live Births   0   1           Home Medications    Prior to Admission medications   Medication Sig Start Date End Date Taking? Authorizing Provider  ALPRAZolam (XANAX) 0.25 MG tablet TAKE 1 TABLET BY MOUTH EVERY EVENING AT BEDTIME AS NEEDED FOR ANXIETY 02/28/16  Terrance Mass, MD  celecoxib (CELEBREX) 100 MG capsule Take 1 capsule (100 mg total) by mouth 2 (two) times daily. 04/16/16   Brunetta Jeans, PA-C  esomeprazole (NEXIUM) 20 MG capsule Take 1 capsule (20 mg total) by mouth daily at 12 noon. 05/07/16   Brunetta Jeans, PA-C  hydrochlorothiazide (HYDRODIURIL) 12.5 MG tablet Take 1 tablet (12.5 mg total) by mouth daily. 05/07/16   Brunetta Jeans, PA-C  meclizine (ANTIVERT) 12.5 MG tablet Take 12.5 mg by mouth 3 (three) times daily as needed for dizziness.  10/17/15   Historical Provider, MD  Multiple Vitamins-Minerals (WOMENS MULTIVITAMIN PLUS)  TABS Take by mouth daily.    Historical Provider, MD    Family History Family History  Problem Relation Age of Onset  . Diabetes Brother   . Ovarian cancer Mother   . Ovarian cancer Paternal Aunt   . Heart disease Maternal Grandmother   . Breast cancer Maternal Grandmother     Age 68's  . Breast cancer Maternal Aunt     Age 48's    Social History Social History  Substance Use Topics  . Smoking status: Never Smoker  . Smokeless tobacco: Never Used  . Alcohol use No     Allergies   Review of patient's allergies indicates no known allergies.   Review of Systems Review of Systems  Constitutional: Negative for chills and fever.  HENT: Negative for rhinorrhea.   Eyes: Negative for pain and visual disturbance.  Respiratory: Negative for cough and shortness of breath.   Cardiovascular: Positive for chest pain and palpitations.  Gastrointestinal: Positive for nausea. Negative for abdominal pain, diarrhea and vomiting.  Genitourinary: Positive for dysuria. Negative for urgency and vaginal discharge.  Musculoskeletal: Negative for back pain, gait problem, myalgias and neck pain.  Neurological: Positive for light-headedness. Negative for weakness.    Physical Exam Updated Vital Signs BP 114/79   Pulse 79   Resp 18   Wt 93.4 kg   LMP 11/10/2011   SpO2 100%   BMI 35.36 kg/m   Physical Exam  Constitutional: She is oriented to person, place, and time. She appears well-developed and well-nourished. No distress.  HENT:  Head: Normocephalic and atraumatic.  Nose: Nose normal.  MM slightly tacky.  Eyes: Conjunctivae and EOM are normal. Pupils are equal, round, and reactive to light.  Neck: Normal range of motion. Neck supple.  Cardiovascular: Normal rate, regular rhythm, normal heart sounds and intact distal pulses.   No murmur heard. Pulmonary/Chest: Effort normal and breath sounds normal. No respiratory distress. She has no wheezes. She exhibits tenderness.  Abdominal:  Soft. Bowel sounds are normal. She exhibits no distension and no mass. There is no tenderness. There is no rebound and no guarding.  Musculoskeletal: Normal range of motion. She exhibits no edema or tenderness.  Neurological: She is alert and oriented to person, place, and time. No cranial nerve deficit. Coordination normal.  Skin: Skin is warm and dry. Capillary refill takes less than 2 seconds. She is not diaphoretic.  Psychiatric: She has a normal mood and affect. Her behavior is normal.  Nursing note and vitals reviewed.  ED Treatments / Results  Labs (all labs ordered are listed, but only abnormal results are displayed) Labs Reviewed  BASIC METABOLIC PANEL - Abnormal; Notable for the following:       Result Value   Chloride 100 (*)    All other components within normal limits  URINALYSIS, ROUTINE W REFLEX MICROSCOPIC (NOT AT [facility]) -  Abnormal; Notable for the following:    Leukocytes, UA TRACE (*)    All other components within normal limits  URINE MICROSCOPIC-ADD ON - Abnormal; Notable for the following:    Squamous Epithelial / LPF 0-5 (*)    Bacteria, UA RARE (*)    All other components within normal limits  TSH  CBC  TROPONIN I    EKG  EKG Interpretation  Date/Time:  Monday May 21 2016 08:42:43 EDT Ventricular Rate:  89 PR Interval:    QRS Duration: 82 QT Interval:  392 QTC Calculation: 477 R Axis:   64 Text Interpretation:  Sinus rhythm Baseline wander in lead(s) III aVL aVF No significant change since last tracing Confirmed by FLOYD MD, Quillian Quince [id number]) on 05/22/2016 6:12:31 PM       Radiology No results found.  Procedures Procedures (including critical care time)  Medications Ordered in ED Medications  sodium chloride 0.9 % bolus 500 mL (0 mLs Intravenous Stopped 05/21/16 1102)  sodium chloride 0.9 % bolus 500 mL (0 mLs Intravenous Stopped 05/21/16 1158)    Initial Impression / Assessment and Plan / ED Course  I have reviewed the triage vital signs and  the nursing notes.  Pertinent labs & imaging results that were available during my care of the patient were reviewed by me and considered in my medical decision making (see chart for details).  Clinical Course   Patient presented after experiencing palpitations at work and noticing fast HR that did not come down. Patient was not tachycardic by the time she came to the ED. MM were slightly tacky so IVFs given. EKG showed normal sinus rhythm. Troponin was negative. TSH obtained and pending. Chest pain was reproducible on exam, so likely MSK in nature. Chest pain was not worsened by particular movements, such as lying flat, and no sustained tachycardia to suggest pericarditis. UA negative. Electrolytes were normal. Patient was no longer having chest pain or palpitations at time of discharge and was agreeable to plan of following up with cardiology for possible holter monitoring to look for arrythmia. Referral to cardiology was placed.    Final Clinical Impressions(s) / ED Diagnoses   Final diagnoses:  Palpitations    New Prescriptions Discharge Medication List as of 05/21/2016 11:55 AM       Rogue Bussing, MD 05/23/16 [id number]    Tanna Furry, MD 06/04/16 [phone]

## 2016-05-21 NOTE — Discharge Instructions (Signed)
Ms. Truscott,  You were evaluated for heart palpitations. This could have been due to dehydration; please drink at least 8 cups of water a day. The electrical activity of your heart looked normal, and your heart enzymes were normal. Your electrolytes and blood counts looked good too. Your urine did not show signs of infection. There is a thyroid level pending. We have referred you to cardiology for further work-up. They may recommend wearing a monitor to look for abnormal heart rhythms. Please return for evaluation if you have chest pain or return of fast heart rate.

## 2016-05-24 ENCOUNTER — Other Ambulatory Visit: Payer: Self-pay | Admitting: Gynecology

## 2016-05-24 DIAGNOSIS — Z1231 Encounter for screening mammogram for malignant neoplasm of breast: Secondary | ICD-10-CM

## 2016-06-05 ENCOUNTER — Ambulatory Visit
Admission: RE | Admit: 2016-06-05 | Discharge: 2016-06-05 | Disposition: A | Discharge status: Home / Self Care | Payer: BLUE CROSS/BLUE SHIELD | Source: Ambulatory Visit | Attending: Gynecology | Admitting: Gynecology

## 2016-06-05 DIAGNOSIS — Z1231 Encounter for screening mammogram for malignant neoplasm of breast: Secondary | ICD-10-CM

## 2016-06-18 ENCOUNTER — Other Ambulatory Visit: Payer: Self-pay | Admitting: Physician Assistant

## 2016-06-18 DIAGNOSIS — S93402A Sprain of unspecified ligament of left ankle, initial encounter: Secondary | ICD-10-CM

## 2016-06-21 ENCOUNTER — Other Ambulatory Visit: Payer: Self-pay | Admitting: Physician Assistant

## 2016-06-21 DIAGNOSIS — S93402A Sprain of unspecified ligament of left ankle, initial encounter: Secondary | ICD-10-CM

## 2016-06-22 ENCOUNTER — Other Ambulatory Visit: Payer: Self-pay | Admitting: Physician Assistant

## 2016-06-22 MED ORDER — ATORVASTATIN CALCIUM 10 MG PO TABS: 10.0000 mg | ORAL_TABLET | Freq: Every day | ORAL | 1 refills | Status: DC

## 2016-06-22 NOTE — Telephone Encounter (Signed)
Please have patient schedule for a 6 month follow up appt. Rx for Antivert has been sent Thanks! PC

## 2016-06-22 NOTE — Telephone Encounter (Signed)
I scheduled f/u for 08/31/16, pt states that she also needs atorvastatin. She is out of medication. It appears it was discontinued previously.

## 2016-08-08 ENCOUNTER — Encounter: Payer: Self-pay | Admitting: Gynecology

## 2016-08-08 ENCOUNTER — Ambulatory Visit (INDEPENDENT_AMBULATORY_CARE_PROVIDER_SITE_OTHER): Payer: BLUE CROSS/BLUE SHIELD | Admitting: Gynecology

## 2016-08-08 VITALS — BP 138/86

## 2016-08-08 DIAGNOSIS — R102 Pelvic and perineal pain: Secondary | ICD-10-CM | POA: Diagnosis not present

## 2016-08-08 DIAGNOSIS — Z01411 Encounter for gynecological examination (general) (routine) with abnormal findings: Secondary | ICD-10-CM

## 2016-08-08 DIAGNOSIS — Z8742 Personal history of other diseases of the female genital tract: Secondary | ICD-10-CM | POA: Diagnosis not present

## 2016-08-08 NOTE — Patient Instructions (Signed)
Ovarian Cyst An ovarian cyst is a fluid-filled sac that forms on an ovary. The ovaries are small organs that produce eggs in women. Various types of cysts can form on the ovaries. Most are not cancerous. Many do not cause problems, and they often go away on their own. Some may cause symptoms and require treatment. Common types of ovarian cysts include:  Functional cysts--These cysts may occur every month during the menstrual cycle. This is normal. The cysts usually go away with the next menstrual cycle if the woman does not get pregnant. Usually, there are no symptoms with a functional cyst.  Endometrioma cysts--These cysts form from the tissue that lines the uterus. They are also called "chocolate cysts" because they become filled with blood that turns brown. This type of cyst can cause pain in the lower abdomen during intercourse and with your menstrual period.  Cystadenoma cysts--This type develops from the cells on the outside of the ovary. These cysts can get very big and cause lower abdomen pain and pain with intercourse. This type of cyst can twist on itself, cut off its blood supply, and cause severe pain. It can also easily rupture and cause a lot of pain.  Dermoid cysts--This type of cyst is sometimes found in both ovaries. These cysts may contain different kinds of body tissue, such as skin, teeth, hair, or cartilage. They usually do not cause symptoms unless they get very big.  Theca lutein cysts--These cysts occur when too much of a certain hormone (human chorionic gonadotropin) is produced and overstimulates the ovaries to produce an egg. This is most common after procedures used to assist with the conception of a baby (in vitro fertilization). CAUSES   Fertility drugs can cause a condition in which multiple large cysts are formed on the ovaries. This is called ovarian hyperstimulation syndrome.  A condition called polycystic ovary syndrome can cause hormonal imbalances that can lead to  nonfunctional ovarian cysts. SIGNS AND SYMPTOMS  Many ovarian cysts do not cause symptoms. If symptoms are present, they may include:  Pelvic pain or pressure.  Pain in the lower abdomen.  Pain during sexual intercourse.  Increasing girth (swelling) of the abdomen.  Abnormal menstrual periods.  Increasing pain with menstrual periods.  Stopping having menstrual periods without being pregnant. DIAGNOSIS  These cysts are commonly found during a routine or annual pelvic exam. Tests may be ordered to find out more about the cyst. These tests may include:  Ultrasound.  X-ray of the pelvis.  CT scan.  MRI.  Blood tests. TREATMENT  Many ovarian cysts go away on their own without treatment. Your health care provider may want to check your cyst regularly for 2-3 months to see if it changes. For women in menopause, it is particularly important to monitor a cyst closely because of the higher rate of ovarian cancer in menopausal women. When treatment is needed, it may include any of the following:  A procedure to drain the cyst (aspiration). This may be done using a long needle and ultrasound. It can also be done through a laparoscopic procedure. This involves using a thin, lighted tube with a tiny camera on the end (laparoscope) inserted through a small incision.  Surgery to remove the whole cyst. This may be done using laparoscopic surgery or an open surgery involving a larger incision in the lower abdomen.  Hormone treatment or birth control pills. These methods are sometimes used to help dissolve a cyst. HOME CARE INSTRUCTIONS   Only take over-the-counter   or prescription medicines as directed by your health care provider.  Follow up with your health care provider as directed.  Get regular pelvic exams and Pap tests. SEEK MEDICAL CARE IF:   Your periods are late, irregular, or painful, or they stop.  Your pelvic pain or abdominal pain does not go away.  Your abdomen becomes  larger or swollen.  You have pressure on your bladder or trouble emptying your bladder completely.  You have pain during sexual intercourse.  You have feelings of fullness, pressure, or discomfort in your stomach.  You lose weight for no apparent reason.  You feel generally ill.  You become constipated.  You lose your appetite.  You develop acne.  You have an increase in body and facial hair.  You are gaining weight, without changing your exercise and eating habits.  You think you are pregnant. SEEK IMMEDIATE MEDICAL CARE IF:   You have increasing abdominal pain.  You feel sick to your stomach (nauseous), and you throw up (vomit).  You develop a fever that comes on suddenly.  You have abdominal pain during a bowel movement.  Your menstrual periods become heavier than usual. MAKE SURE YOU:  Understand these instructions.  Will watch your condition.  Will get help right away if you are not doing well or get worse.   This information is not intended to replace advice given to you by your health care provider. Make sure you discuss any questions you have with your health care provider.   Document Released: 10/15/2005 Document Revised: 10/20/2013 Document Reviewed: 06/22/2013 Elsevier Interactive Patient Education 2016 [employer]  

## 2016-08-08 NOTE — Progress Notes (Signed)
Madeline Hawkins 1967/01/48 [id number]   History:    50 y.o.  for annual gyn exam with complaints of at times on and off right lower quadrant discomfort. Review of her record indicated that on 06/28/2015 patient underwent laparoscopic right salpingo-oophorectomy along with lysis of abdominal pelvic adhesions as a result of persistent right ovarian cyst and pelvic pain. Findings, pathology report and pictures were shared with the patient as follows:  FINDINGS: Anterior abdominal wall adhesions. A large right ovary multicystic smooth surface approximately 3 x 4 cm adhered to the right pelvic sidewall. Cul-de-sac was clear of adhesions or any endometriosis. Left ovary was normal absent left tube. Normal-appearing appendix. Smooth liver surface  Pathology report: Diagnosis Ovary and fallopian tube, right - BENIGN SEROUS CYSTADENOMA. - CYSTIC FOLLICLES AND HEMORRHAGIC CORPUS LUTEUM. - BENIGN FALLOPIAN TUBE. - NO ENDOMETRIOSIS OR MALIGNANCY IDENTIFIED.  She was then seen in the office on 11/30/2015 and had been complaining of stabbing like sensation and left lower quadrant of her abdomen so she had an ultrasound which demonstrated the following: A left  echo-free avascular cyst measuring 3.6 x 3.5 x 3.4 cm average size 3.5 Center meter. Left cul-de-sac and surrounding area of left ovary free fluid 4.3 x 3.6 x 1.8 cm. Right adnexa was negative.  She was given a shot of Depo-Provera 150 mg IM was asked to return back in 3 months for follow-up ultrasound. She had a normal CA 125.  She then presented to the emergency room on 01/18/2016 complaining of right lower abdominal pain and a CT had been done which demonstrated the following:  IMPRESSION: 1. There is no evidence of acute inflammatory process within abdomen. 2. Normal appendix.  No pericecal inflammation. 3. Again noted umbilical and supraumbilical component small hernia containing fat without evidence of acute complication. Measures  2.3 x 2.3 cm. 4. No hydronephrosis or hydroureter. Bilateral renal symmetrical excretion. 5. Surgically absent uterus. The right ovary is not identified. There is a simple cyst within left ovary measures 2.6 cm. No pelvic free fluid. 6. Minimal degenerative changes lower thoracic spine.  She denies any dyspareunia. Pathology report from her hysterectomy demonstrated no evidence of dysplasia. Many years ago she had had CIN-1 prior hysterectomy and had been treated with cryotherapy. She also had been formally that less than 5 years ago she had a colonoscopy because of rectal bleeding and benign polyps were noted and she is on a 5 year recall.   Past medical history,surgical history, family history and social history were all reviewed and documented in the EPIC chart.  Gynecologic History Patient's last menstrual period was 11/10/2011. Contraception: status post hysterectomy Last Pap: 2013. Results were: normal Last mammogram: 2017. Results were: normal  Obstetric History OB History  Gravida Para Term Preterm AB Living  5 4 4   1 4   SAB TAB Ectopic Multiple Live Births  0   1   4    # Outcome Date GA Lbr Len/2nd Weight Sex Delivery Anes PTL Lv  5 Ectopic           4 Term     F Vag-Spont  N LIV  3 Term     M Vag-Spont  N LIV  2 Term     F Vag-Spont  N LIV  1 Term     F Vag-Spont  N LIV       ROS: A ROS was performed and pertinent positives and negatives are included in the history.  GENERAL: No fevers  or chills. HEENT: No change in vision, no earache, sore throat or sinus congestion. NECK: No pain or stiffness. CARDIOVASCULAR: No chest pain or pressure. No palpitations. PULMONARY: No shortness of breath, cough or wheeze. GASTROINTESTINAL: No abdominal pain, nausea, vomiting or diarrhea, melena or bright red blood per rectum. GENITOURINARY: No urinary frequency, urgency, hesitancy or dysuria. MUSCULOSKELETAL: No joint or muscle pain, no back pain, no recent trauma. DERMATOLOGIC: No  rash, no itching, no lesions. ENDOCRINE: No polyuria, polydipsia, no heat or cold intolerance. No recent change in weight. HEMATOLOGICAL: No anemia or easy bruising or bleeding. NEUROLOGIC: No headache, seizures, numbness, tingling or weakness. PSYCHIATRIC: No depression, no loss of interest in normal activity or change in sleep pattern.     Exam: chaperone present  BP 138/86   LMP 11/10/2011   There is no height or weight on file to calculate BMI.  General appearance : Well developed well nourished female. No acute distress HEENT: Eyes: no retinal hemorrhage or exudates,  Neck supple, trachea midline, no carotid bruits, no thyroidmegaly Lungs: Clear to auscultation, no rhonchi or wheezes, or rib retractions  Heart: Regular rate and rhythm, no murmurs or gallops Breast:Examined in sitting and supine position were symmetrical in appearance, no palpable masses or tenderness,  no skin retraction, no nipple inversion, no nipple discharge, no skin discoloration, no axillary or supraclavicular lymphadenopathy Abdomen: no palpable masses or tenderness, no rebound or guarding Extremities: no edema or skin discoloration or tenderness  Pelvic:  Bartholin, Urethra, Skene Glands: Within normal limits             Vagina: No gross lesions or discharge  Cervix: Absent  Uterus absent  Adnexa  Without masses or tenderness  Anus and perineum  normal   Rectovaginal  normal sphincter tone without palpated masses or tenderness             Hemoccult cards will be provided     Assessment/Plan:  50 y.o. female for annual exam patient will follow-up in the next 1-2 weeks with an ultrasound to assess her remaining ovary make sure the cyst has resolved. Pap smear not indicated. PCP has been doing her blood work. Patient declined flu vaccine. We discussed importance of calcium vitamin D and weightbearing exercises for osteoporosis prevention. We discussed importance of monthly breast exams.   Terrance Mass  MD, 9:01 AM 08/08/2016

## 2016-08-15 ENCOUNTER — Telehealth: Payer: Self-pay | Admitting: *Deleted

## 2016-08-15 NOTE — Telephone Encounter (Signed)
Pt's plan with [employer] pays [place] and visit at 20% since deductible has been met.   Her responsibility is $83.43  Pt will be informed KW CMA

## 2016-08-22 ENCOUNTER — Ambulatory Visit (INDEPENDENT_AMBULATORY_CARE_PROVIDER_SITE_OTHER): Payer: BLUE CROSS/BLUE SHIELD

## 2016-08-22 ENCOUNTER — Other Ambulatory Visit: Payer: Self-pay | Admitting: Gynecology

## 2016-08-22 ENCOUNTER — Ambulatory Visit (INDEPENDENT_AMBULATORY_CARE_PROVIDER_SITE_OTHER): Payer: BLUE CROSS/BLUE SHIELD | Admitting: Gynecology

## 2016-08-22 ENCOUNTER — Encounter: Payer: Self-pay | Admitting: Gynecology

## 2016-08-22 ENCOUNTER — Encounter: Payer: Self-pay | Admitting: Anesthesiology

## 2016-08-22 VITALS — BP 132/86

## 2016-08-22 DIAGNOSIS — N83202 Unspecified ovarian cyst, left side: Secondary | ICD-10-CM | POA: Insufficient documentation

## 2016-08-22 DIAGNOSIS — R102 Pelvic and perineal pain: Secondary | ICD-10-CM

## 2016-08-22 DIAGNOSIS — N838 Other noninflammatory disorders of ovary, fallopian tube and broad ligament: Secondary | ICD-10-CM

## 2016-08-22 DIAGNOSIS — Z8742 Personal history of other diseases of the female genital tract: Secondary | ICD-10-CM

## 2016-08-22 NOTE — Progress Notes (Signed)
Patient ID: Madeline Hawkins, female   DOB: 1966-10-14, 50 y.o.   MRN: [id number]

## 2016-08-22 NOTE — Progress Notes (Addendum)
Madeline Hawkins is an 50 y.o. female  To discuss her ultrasound as part of her follow-up on a persistent left ovarian cyst and pelvic pain. She was seen in the office for her annual exam October 11 in her history is as follows:  Review of her record indicated that on 06/28/2015 patient underwent laparoscopic right salpingo-oophorectomy along with lysis of abdominal pelvic adhesions as a result of persistent right ovarian cyst and pelvic pain. Findings, pathology report and pictures were shared with the patient as follows:  FINDINGS: Anterior abdominal wall adhesions. A large right ovary multicystic smooth surface approximately 3 x 4 cm adhered to the right pelvic sidewall. Cul-de-sac was clear of adhesions or any endometriosis. Left ovary was normal absent left tube. Normal-appearing appendix. Smooth liver surface  Pathology report: Diagnosis Ovary and fallopian tube, right - BENIGN SEROUS CYSTADENOMA. - CYSTIC FOLLICLES AND HEMORRHAGIC CORPUS LUTEUM. - BENIGN FALLOPIAN TUBE. - NO ENDOMETRIOSIS OR MALIGNANCY IDENTIFIED.  She was then seen in the office on 11/30/2015 and had been complaining of stabbing like sensation and left lower quadrant of her abdomen so she had an ultrasound which demonstrated the following: A left echo-free avascular cyst measuring 3.6 x 3.5 x 3.4 cm average size 3.5 Center meter. Left cul-de-sac and surrounding area of left ovary free fluid 4.3 x 3.6 x 1.8 cm. Right adnexa was negative.  She was given a shot of Depo-Provera 150 mg IM was asked to return back in 3 months for follow-up ultrasound. She had a normal CA 125.  She then presented to the emergency room on 01/18/2016 complaining of right lower abdominal pain and a CT had been done which demonstrated the following:  IMPRESSION: 1. There is no evidence of acute inflammatory process within abdomen. 2. Normal appendix. No pericecal inflammation. 3. Again noted umbilical and supraumbilical component  small hernia containing fat without evidence of acute complication. Measures 2.3 x 2.3 cm. 4. No hydronephrosis or hydroureter. Bilateral renal symmetrical excretion. 5. Surgically absent uterus. The right ovary is not identified. There is a simple cyst within left ovary measures 2.6 cm. No pelvic free fluid. 6. Minimal degenerative changes lower thoracic spine.  She denies any dyspareunia. Pathology report from her hysterectomy demonstrated no evidence of dysplasia. Many years ago she had had CIN-1 prior hysterectomy and had been treated with cryotherapy. She also had been formally that less than 5 years ago she had a colonoscopy because of rectal bleeding and benign polyps were noted and she is on a 5 year recall.  Ultrasound today: Absent uterus and right adnexa. Left ovary enlarged. 3 thinwall echo-free avascular cyst measuring 4.0 x 3.0 x 3.5 cm, 4.4 x 4.9 x 5.2 cm, 3.6 x 3.3 x 2.3 cm there was some free fluid in the left adnexa adjacent to the left ovary measuring 3.1 x 1.8 x 4.2 cm. Arterial blood flow was seen to the left ovary.  Pertinent Gynecological History: Menses: None  Bleeding: none Contraception: none DES exposure: denies Blood transfusions: none Sexually transmitted diseases: no past history Previous GYN Procedures: 4 normal spontaneous vaginal delivery, tubal ligation laparoscopic: TAH, left ovarian cystectomy, right salpingo-oophorectomy  Last mammogram: normal Date: 2017 Last pap: normal Date: 2013 OB History: G 4, P 4   Menstrual History: Menarche age: 13 Patient's last menstrual period was 11/10/2011.    Past Medical History:  Diagnosis Date  . Anxiety   . CIN I (cervical intraepithelial neoplasia I) 1989   cryo...also in 2008 .. had cryo   .  Hyperlipidemia   . Hypertension     Past Surgical History:  Procedure Laterality Date  . ABDOMINAL HYSTERECTOMY  11/22/2011   Procedure: HYSTERECTOMY ABDOMINAL;  Surgeon: Terrance Mass, MD;  Location: [facility];   Service: Gynecology;  Laterality: N/A;  . colon polyps removed    . GYNECOLOGIC CRYOSURGERY  07/07/2007  . LAPAROSCOPIC LYSIS OF ADHESIONS  06/28/2015   Procedure: LAPAROSCOPIC LYSIS OF ABDOMINAL PELVIC ADHESIONS;  Surgeon: Terrance Mass, MD;  Location: [facility];  Service: Gynecology;;  . LAPAROSCOPIC UNILATERAL SALPINGO OOPHERECTOMY Right 06/28/2015   Procedure: LAPAROSCOPIC RIGHT SALPINGO OOPHORECTOMY;  Surgeon: Terrance Mass, MD;  Location: [facility];  Service: Gynecology;  Laterality: Right;  . LAPAROSCOPY  11/22/2011   Procedure: LAPAROSCOPY DIAGNOSTIC;  Surgeon: Terrance Mass, MD;  Location: [facility];  Service: Gynecology;  Laterality: N/A;  . OVARIAN CYST REMOVAL  11/22/2011   Procedure: OVARIAN CYSTECTOMY;  Surgeon: Terrance Mass, MD;  Location: [facility];  Service: Gynecology;  Laterality: Left;  . SALPINGECTOMY     left for ectopic pregnancy  . TUBAL LIGATION     right tubal ligation    Family History  Problem Relation Age of Onset  . Diabetes Brother   . Ovarian cancer Mother   . Ovarian cancer Paternal Aunt   . Heart disease Maternal Grandmother   . Breast cancer Maternal Grandmother     Age 41's  . Breast cancer Maternal Aunt     Age 41's    Social History:  reports that she has never smoked. She has never used smokeless tobacco. She reports that she does not drink alcohol or use drugs.  Allergies: No Known Allergies   (Not in a hospital admission)  REVIEW OF SYSTEMS: A ROS was performed and pertinent positives and negatives are included in the history.  GENERAL: No fevers or chills. HEENT: No change in vision, no earache, sore throat or sinus congestion. NECK: No pain or stiffness. CARDIOVASCULAR: No chest pain or pressure. No palpitations. PULMONARY: No shortness of breath, cough or wheeze. GASTROINTESTINAL: No abdominal pain, nausea, vomiting or diarrhea, melena or bright red blood per rectum. GENITOURINARY: No urinary frequency, urgency, hesitancy or dysuria.  MUSCULOSKELETAL: No joint or muscle pain, no back pain, no recent trauma. DERMATOLOGIC: No rash, no itching, no lesions. ENDOCRINE: No polyuria, polydipsia, no heat or cold intolerance. No recent change in weight. HEMATOLOGICAL: No anemia or easy bruising or bleeding. NEUROLOGIC: No headache, seizures, numbness, tingling or weakness. PSYCHIATRIC: No depression, no loss of interest in normal activity or change in sleep pattern.     Blood pressure 132/86, last menstrual period 11/10/2011.  Physical Exam: HEENT:unremarkable Neck:Supple, midline, no thyroid megaly, no carotid bruits Lungs:  Clear to auscultation no rhonchi's or wheezes Heart:Regular rate and rhythm, no murmurs or gallops Breast Exam: Both breasts are symmetrical in appearance no palpable masses or tenderness no supraclavicular axillary lymphadenopathy Abdomen: Soft nontender no rebound or guarding Pelvic:BUS within normal limits Vagina: No lesions or discharge Cervix: Absent Uterus: Absent Adnexa: Tenderness in right lower quadrant Extremities: No cords, no edema Rectal: Unremarkable  No results found for this or any previous visit (from the past 24 hour(s)).  No results found.  Assessment/Plan: 50 year old with persistent left ovarian cyst and abdominal discomfort. 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 Patient will be scheduled for laparoscopic left salpingo-oophorectomy for persistently enlarged left ovarian cyst with recent CA 125 being normal we'll recheck today as well. We had  a detailed discussion of the risk of the operation and potential for laparotomy depending on the findings. Patient is ready to move forward. The following risk for discuss:                        Patient was counseled as to the risk of surgery to include the following:  1. Infection (prohylactic antibiotics will be administered)  2. DVT/Pulmonary Embolism (prophylactic pneumo compression  stockings will be used)  3.Trauma to internal organs requiring additional surgical procedure to repair any injury to     Internal organs requiring perhaps additional hospitalization days.  4.Hemmorhage requiring transfusion and blood products which carry risks such as             anaphylactic reaction, hepatitis and AIDS  Patient had received literature information on the procedure scheduled and all her questions were answered and fully accepts all risk.   [facility] HMD9:14 AMTD     Uvaldo Rising H 08/22/2016, 9:05 AM

## 2016-08-23 LAB — CA 125: CA 125: 8 U/mL (ref <35)

## 2016-08-29 ENCOUNTER — Other Ambulatory Visit: Payer: Self-pay | Admitting: Anesthesiology

## 2016-08-29 DIAGNOSIS — Z1211 Encounter for screening for malignant neoplasm of colon: Secondary | ICD-10-CM

## 2016-08-31 ENCOUNTER — Encounter: Payer: Self-pay | Admitting: Physician Assistant

## 2016-08-31 ENCOUNTER — Ambulatory Visit (INDEPENDENT_AMBULATORY_CARE_PROVIDER_SITE_OTHER): Payer: BLUE CROSS/BLUE SHIELD | Admitting: Physician Assistant

## 2016-08-31 VITALS — BP 118/78 | HR 89 | Temp 98.4°F | Resp 16 | Ht 64.0 in | Wt 214.2 lb

## 2016-08-31 DIAGNOSIS — I10 Essential (primary) hypertension: Secondary | ICD-10-CM

## 2016-08-31 DIAGNOSIS — E785 Hyperlipidemia, unspecified: Secondary | ICD-10-CM | POA: Diagnosis not present

## 2016-08-31 DIAGNOSIS — R0602 Shortness of breath: Secondary | ICD-10-CM

## 2016-08-31 LAB — CBC
HCT: 38.5 % (ref 36.0–46.0)
Hemoglobin: 12.6 g/dL (ref 12.0–15.0)
MCHC: 32.8 g/dL (ref 30.0–36.0)
MCV: 83.7 fl (ref 78.0–100.0)
PLATELETS: 242 10*3/uL (ref 150.0–400.0)
RBC: 4.6 Mil/uL (ref 3.87–5.11)
RDW: 16.2 % — AB (ref 11.5–15.5)
WBC: 5.9 10*3/uL (ref 4.0–10.5)

## 2016-08-31 LAB — BRAIN NATRIURETIC PEPTIDE: Pro B Natriuretic peptide (BNP): 8 pg/mL (ref 0.0–100.0)

## 2016-08-31 LAB — COMPREHENSIVE METABOLIC PANEL
ALT: 18 U/L (ref 0–35)
AST: 14 U/L (ref 0–37)
Albumin: 4 g/dL (ref 3.5–5.2)
Alkaline Phosphatase: 56 U/L (ref 39–117)
BUN: 11 mg/dL (ref 6–23)
CALCIUM: 9.6 mg/dL (ref 8.4–10.5)
CHLORIDE: 103 meq/L (ref 96–112)
CO2: 31 meq/L (ref 19–32)
CREATININE: 0.75 mg/dL (ref 0.40–1.20)
GFR: 105.99 mL/min (ref >60.00)
GLUCOSE: 79 mg/dL (ref 70–99)
Potassium: 3.8 mEq/L (ref 3.5–5.1)
Sodium: 140 mEq/L (ref 135–145)
Total Bilirubin: 0.4 mg/dL (ref 0.2–1.2)
Total Protein: 7 g/dL (ref 6.0–8.3)

## 2016-08-31 NOTE — Progress Notes (Signed)
Pre visit review using our clinic review tool, if applicable. No additional management support is needed unless otherwise documented below in the visit note/SLS  

## 2016-08-31 NOTE — Patient Instructions (Signed)
Please go to the lab for blood work. Your EKG is normal which is good.  We will be setting you up for an echocardiogram pending lab results.  Please continue current medication regimen. Limit salt intake. Eat more fruits and vegetables. Stay more active -- I believe intermittent swelling and other symptoms are stemming from deconditioning.  Follow-up will be based on results.

## 2016-08-31 NOTE — Progress Notes (Signed)
Patient presents to clinic today for follow-up of hypertension and hyperlipidemia.   Hypertension -- Patient is currently on regimen of HCT 12.5 mg daily. Endorses taking medications as directed. Patient denies chest pain, palpitations, lightheadedness, dizziness, vision changes or frequent headaches. Does note SOB with exertion. Resolves quickly with activity cessation. Patient denies PND or Orthopnea. Does note occasional foot/ankle swelling, mainly in the evenings. Is trying to eat better. Denies high salt content to diet. Does endorses mainly sedentary lifestyle. No exercise of major physical activity in her days.   BP Readings from Last 3 Encounters:  08/31/16 118/78  08/22/16 132/86  08/08/16 138/86   Hyperlipidemia -- Patient is currently on a regimen of Lipitor 5 mg daily. Is taking medication as directed without side effect. Again, mainly sedentary lifestyle. Body mass index is 36.78 kg/m.   Past Medical History:  Diagnosis Date  . Anxiety   . CIN I (cervical intraepithelial neoplasia I) 1989   cryo...also in 2008 .. had cryo   . Hyperlipidemia   . Hypertension     Current Outpatient Prescriptions on File Prior to Visit  Medication Sig Dispense Refill  . ALPRAZolam (XANAX) 0.25 MG tablet TAKE 1 TABLET BY MOUTH EVERY EVENING AT BEDTIME AS NEEDED FOR ANXIETY 30 tablet 3  . atorvastatin (LIPITOR) 10 MG tablet Take 1 tablet (10 mg total) by mouth daily. 90 tablet 1  . celecoxib (CELEBREX) 100 MG capsule Take 1 capsule (100 mg total) by mouth 2 (two) times daily. (Patient taking differently: Take 100 mg by mouth 2 (two) times daily as needed. ) 60 capsule 0  . esomeprazole (NEXIUM) 20 MG capsule Take 1 capsule (20 mg total) by mouth daily at 12 noon. 90 capsule 1  . hydrochlorothiazide (HYDRODIURIL) 12.5 MG tablet Take 1 tablet (12.5 mg total) by mouth daily. 90 tablet 1  . meclizine (ANTIVERT) 12.5 MG tablet TAKE 1 TABLET BY MOUTH TWICE DAILY AS NEEDED FOR DIZZINESS 90 tablet 0    . Multiple Vitamins-Minerals (WOMENS MULTIVITAMIN PLUS) TABS Take by mouth daily.     No current facility-administered medications on file prior to visit.     No Known Allergies  Family History  Problem Relation Age of Onset  . Diabetes Brother   . Ovarian cancer Mother   . Ovarian cancer Paternal Aunt   . Heart disease Maternal Grandmother   . Breast cancer Maternal Grandmother     Age 35's  . Breast cancer Maternal Aunt     Age 8's    Social History   Social History  . Marital status: Married    Spouse name: N/A  . Number of children: N/A  . Years of education: N/A   Social History Main Topics  . Smoking status: Never Smoker  . Smokeless tobacco: Never Used  . Alcohol use No  . Drug use: No  . Sexual activity: Yes    Birth control/ protection: Surgical     Comment: tubal ligation   Other Topics Concern  . None   Social History Narrative  . None   Review of Systems - See HPI.  All other ROS are negative.  BP 118/78 (BP Location: Right Arm, Patient Position: Sitting, Cuff Size: Large)   Pulse 89   Temp 98.4 F (36.9 C) (Oral)   Resp 16   Ht _0  (1.626 m)   Wt 214 lb 4 oz (97.2 kg)   LMP 11/10/2011   SpO2 98%   BMI 36.78 kg/m   Physical Exam  Constitutional: She is oriented to person, place, and time and well-developed, well-nourished, and in no distress.  HENT:  Head: Normocephalic and atraumatic.  Eyes: Conjunctivae are normal.  Neck: Neck supple.  Cardiovascular: Normal rate, regular rhythm, normal heart sounds and intact distal pulses.   Pulses:      Dorsalis pedis pulses are 2+ on the right side, and 2+ on the left side.       Posterior tibial pulses are 2+ on the right side, and 2+ on the left side.  No evidence of edema or swelling of bilateral lower extremities on examination  Pulmonary/Chest: Effort normal and breath sounds normal. No respiratory distress. She has no wheezes. She has no rales. She exhibits no tenderness.  Neurological:  She is alert and oriented to person, place, and time.  Skin: Skin is warm and dry. No rash noted.  Psychiatric: Affect normal.  Vitals reviewed.  Recent Results (from the past 2160 hour(s))  CA 125     Status: None   Collection Time: 08/22/16  9:53 AM  Result Value Ref Range   CA 125 8 <35 U/mL    Comment:   This test was performed using the Beckman Coulter chemiluminescent method.  Values obtained from different assay methods cannot be used interchangeably.  CA125 levels , regardless of value, should not be interpreted as absolute evidence of the presence or absence of disease.    Assessment/Plan: 1. SOBOE (shortness of breath on exertion) Unclear. Potentially deconditioning. EKG obtained revealing NSR. Will check CBC, CMP and BNP today. Will consider echocardiogram. Discussed goal BMI. Proper diet reviewed. - EKG 12-Lead - CBC - Comp Met (CMET) - B Nat Peptide  2. Hyperlipidemia, unspecified hyperlipidemia type Tolerating statin. Will check LFTs today.  3. Essential hypertension BP normotensive. Discussed mild swelling related to diet. None appreciated today. Will start DASH diet, increased movement and compression stockings as needed. Lab workup in process. Will alter regimen based on findings.    Leeanne Rio, PA-C

## 2016-10-04 ENCOUNTER — Other Ambulatory Visit: Payer: Self-pay | Admitting: Gynecology

## 2016-10-04 NOTE — Patient Instructions (Signed)
Your procedure is scheduled on:  Thursday, Dec. 14, 2017  Enter through the [employer] [facility] at:  6:00 AM  Pick up the phone at the desk and dial [phone].  Call this number if you have problems the morning of surgery: [phone].  Remember: Do NOT eat food or drink after:  Midnight Wednesday  Take these medicines the morning of surgery with a SIP OF WATER:  Atorvastatin, Nexium, Hydrochlorothiazide  Stop ALL herbal medications at this time   Do NOT wear jewelry (body piercing), metal hair clips/bobby pins, make-up, or nail polish. Do NOT wear lotions, powders, or perfumes.  You may wear deodorant. Do NOT shave for 48 hours prior to surgery. Do NOT bring valuables to the hospital. Contacts, dentures, or bridgework may not be worn into surgery.  Leave suitcase in car.  After surgery it may be brought to your room.  For patients admitted to the hospital, checkout time is 11:00 AM the day of discharge.  Have a responsible adult drive you home and stay with you for 24 hours after your procedure

## 2016-10-05 ENCOUNTER — Telehealth: Payer: Self-pay

## 2016-10-05 ENCOUNTER — Encounter (HOSPITAL_COMMUNITY): Payer: Self-pay

## 2016-10-05 ENCOUNTER — Other Ambulatory Visit: Payer: Self-pay | Admitting: Physician Assistant

## 2016-10-05 ENCOUNTER — Encounter (HOSPITAL_COMMUNITY)
Admission: RE | Admit: 2016-10-05 | Discharge: 2016-10-05 | Disposition: A | Discharge status: Home / Self Care | Payer: BLUE CROSS/BLUE SHIELD | Source: Ambulatory Visit | Attending: Gynecology | Admitting: Gynecology

## 2016-10-05 DIAGNOSIS — K219 Gastro-esophageal reflux disease without esophagitis: Secondary | ICD-10-CM | POA: Insufficient documentation

## 2016-10-05 DIAGNOSIS — I10 Essential (primary) hypertension: Secondary | ICD-10-CM | POA: Insufficient documentation

## 2016-10-05 DIAGNOSIS — N83202 Unspecified ovarian cyst, left side: Secondary | ICD-10-CM | POA: Insufficient documentation

## 2016-10-05 DIAGNOSIS — F419 Anxiety disorder, unspecified: Secondary | ICD-10-CM | POA: Insufficient documentation

## 2016-10-05 DIAGNOSIS — D649 Anemia, unspecified: Secondary | ICD-10-CM | POA: Diagnosis not present

## 2016-10-05 DIAGNOSIS — M25551 Pain in right hip: Secondary | ICD-10-CM | POA: Diagnosis not present

## 2016-10-05 DIAGNOSIS — E785 Hyperlipidemia, unspecified: Secondary | ICD-10-CM | POA: Insufficient documentation

## 2016-10-05 DIAGNOSIS — F4321 Adjustment disorder with depressed mood: Secondary | ICD-10-CM | POA: Diagnosis not present

## 2016-10-05 DIAGNOSIS — Z01812 Encounter for preprocedural laboratory examination: Secondary | ICD-10-CM | POA: Insufficient documentation

## 2016-10-05 HISTORY — DX: Gastro-esophageal reflux disease without esophagitis: K21.9

## 2016-10-05 LAB — BASIC METABOLIC PANEL
Anion gap: 9 (ref 5–15)
BUN: 10 mg/dL (ref 6–20)
CO2: 27 mmol/L (ref 22–32)
CREATININE: 0.85 mg/dL (ref 0.44–1.00)
Calcium: 9.4 mg/dL (ref 8.9–10.3)
Chloride: 103 mmol/L (ref 101–111)
GFR calc Af Amer: >60 mL/min (ref >60)
GLUCOSE: 114 mg/dL — AB (ref 65–99)
POTASSIUM: 3.1 mmol/L — AB (ref 3.5–5.1)
Sodium: 139 mmol/L (ref 135–145)

## 2016-10-05 LAB — CBC
HEMATOCRIT: 38.5 % (ref 36.0–46.0)
Hemoglobin: 12.4 g/dL (ref 12.0–15.0)
MCH: 27.1 pg (ref 26.0–34.0)
MCHC: 32.2 g/dL (ref 30.0–36.0)
MCV: 84.2 fL (ref 78.0–100.0)
PLATELETS: 236 10*3/uL (ref 150–400)
RBC: 4.57 MIL/uL (ref 3.87–5.11)
RDW: 15.5 % (ref 11.5–15.5)
WBC: 5.6 10*3/uL (ref 4.0–10.5)

## 2016-10-05 MED ORDER — POTASSIUM CHLORIDE CRYS ER 20 MEQ PO TBCR: 20.0000 meq | EXTENDED_RELEASE_TABLET | Freq: Every day | ORAL | 3 refills | Status: DC

## 2016-10-05 NOTE — Telephone Encounter (Signed)
Patient's potassium level on pre op labs came back low at 3.1.  I staff messaged her PCP and he wrote back that he would send Klor Con Rx in to pharmacy for patient.  I called patient and left message about this and advised her to pick it up and start it asap.  I also asked her to call me back and let me know that she received my message and if any questions.

## 2016-10-05 NOTE — Telephone Encounter (Signed)
Called into pharmacy

## 2016-10-05 NOTE — Telephone Encounter (Signed)
Patient called back and we discussed this and need to start asap.

## 2016-10-10 ENCOUNTER — Encounter (HOSPITAL_COMMUNITY): Payer: Self-pay | Admitting: Anesthesiology

## 2016-10-10 MED ORDER — DEXTROSE 5 % IV SOLN
2.0000 g | INTRAVENOUS | Status: AC
  Administered 2016-10-11: 2 g via INTRAVENOUS
  Filled 2016-10-10: qty 2

## 2016-10-10 NOTE — Anesthesia Preprocedure Evaluation (Addendum)
Anesthesia Evaluation  Patient identified by MRN, date of birth, ID band Patient awake    Reviewed: Allergy & Precautions, H&P , NPO status , Patient's Chart, lab work & pertinent test results  History of Anesthesia Complications Negative for: history of anesthetic complications  Airway Mallampati: I  TM Distance: >3 FB Neck ROM: full    Dental no notable dental hx. (+) Chipped   Pulmonary neg pulmonary ROS,    Pulmonary exam normal        Cardiovascular Exercise Tolerance: Good hypertension, On Medications Normal cardiovascular exam     Neuro/Psych negative neurological ROS     GI/Hepatic Neg liver ROS, GERD (no meds)  Medicated and Controlled,  Endo/Other  negative endocrine ROS  Renal/GU negative Renal ROS  negative genitourinary   Musculoskeletal   Abdominal (+) + obese,   Peds  Hematology   Anesthesia Other Findings   Reproductive/Obstetrics negative OB ROS                            Anesthesia Physical  Anesthesia Plan  ASA: II  Anesthesia Plan: General ETT   Post-op Pain Management:    Induction: Intravenous  Airway Management Planned: Oral ETT  Additional Equipment:   Intra-op Plan:   Post-operative Plan: Extubation in OR  Informed Consent: I have reviewed the patients History and Physical, chart, labs and discussed the procedure including the risks, benefits and alternatives for the proposed anesthesia with the patient or authorized representative who has indicated his/her understanding and acceptance.   Dental Advisory Given  Plan Discussed with: CRNA and Surgeon  Anesthesia Plan Comments:        Anesthesia Quick Evaluation

## 2016-10-10 NOTE — H&P (Signed)
Madeline Hawkins is an 50 y.o. female  To discuss her ultrasound as part of her follow-up on a persistent left ovarian cyst and pelvic pain. She was seen in the office for her annual exam October 11 in her history is as follows:  Review of her record indicated that on 06/28/2015 patient underwent laparoscopic right salpingo-oophorectomy along with lysis of abdominal pelvic adhesions as a result of persistent right ovarian cyst and pelvic pain. Findings, pathology report and pictures were shared with the patient as follows:  FINDINGS: Anterior abdominal wall adhesions. A large right ovary multicystic smooth surface approximately 3 x 4 cm adhered to the right pelvic sidewall. Cul-de-sac was clear of adhesions or any endometriosis. Left ovary was normal absent left tube. Normal-appearing appendix. Smooth liver surface  Pathology report: Diagnosis Ovary and fallopian tube, right - BENIGN SEROUS CYSTADENOMA. - CYSTIC FOLLICLES AND HEMORRHAGIC CORPUS LUTEUM. - BENIGN FALLOPIAN TUBE. - NO ENDOMETRIOSIS OR MALIGNANCY IDENTIFIED.  She was then seen in the office on 11/30/2015 and had been complaining of stabbing like sensation and left lower quadrant of her abdomen so she had an ultrasound which demonstrated the following: A left echo-free avascular cyst measuring 3.6 x 3.5 x 3.4 cm average size 3.5 Center meter. Left cul-de-sac and surrounding area of left ovary free fluid 4.3 x 3.6 x 1.8 cm. Right adnexa was negative.  She was given a shot of Depo-Provera 150 mg IM was asked to return back in 3 months for follow-up ultrasound. She had a normal CA 125.  She then presented to the emergency room on 01/18/2016 complaining of right lower abdominal pain and a CT had been done which demonstrated the following:  IMPRESSION: 1. There is no evidence of acute inflammatory process within abdomen. 2. Normal appendix. No pericecal inflammation. 3. Again noted umbilical and supraumbilical component small  hernia containing fat without evidence of acute complication. Measures 2.3 x 2.3 cm. 4. No hydronephrosis or hydroureter. Bilateral renal symmetrical excretion. 5. Surgically absent uterus. The right ovary is not identified. There is a simple cyst within left ovary measures 2.6 cm. No pelvic free fluid. 6. Minimal degenerative changes lower thoracic spine.  She denies any dyspareunia. Pathology report from her hysterectomy demonstrated no evidence of dysplasia. Many years ago she had had CIN-1 prior hysterectomy and had been treated with cryotherapy. She also had been formally that less than 5 years ago she had a colonoscopy because of rectal bleeding and benign polyps were noted and she is on a 5 year recall.  Ultrasound today: Absent uterus and right adnexa. Left ovary enlarged. 3 thinwall echo-free avascular cyst measuring 4.0 x 3.0 x 3.5 cm, 4.4 x 4.9 x 5.2 cm, 3.6 x 3.3 x 2.3 cm there was some free fluid in the left adnexa adjacent to the left ovary measuring 3.1 x 1.8 x 4.2 cm. Arterial blood flow was seen to the left ovary.  Pertinent Gynecological History: Menses: None  Bleeding: none Contraception: none DES exposure: denies Blood transfusions: none Sexually transmitted diseases: no past history Previous GYN Procedures: 4 normal spontaneous vaginal delivery, tubal ligation laparoscopic: TAH, left ovarian cystectomy, right salpingo-oophorectomy  Last mammogram: normal Date: 2017 Last pap: normal Date: 2013 OB History: G 4, P 4     Menstrual History: Menarche age: 53 Patient's last menstrual period was 11/10/2011.        Past Medical History:  Diagnosis Date  . Anxiety   . CIN I (cervical intraepithelial neoplasia I) 1989   cryo...also in 2008 .Marland Kitchen  had cryo   . Hyperlipidemia   . Hypertension          Past Surgical History:  Procedure Laterality Date  . ABDOMINAL HYSTERECTOMY  11/22/2011   Procedure: HYSTERECTOMY ABDOMINAL;  Surgeon: Terrance Mass, MD;   Location: [facility];  Service: Gynecology;  Laterality: N/A;  . colon polyps removed    . GYNECOLOGIC CRYOSURGERY  07/07/2007  . LAPAROSCOPIC LYSIS OF ADHESIONS  06/28/2015   Procedure: LAPAROSCOPIC LYSIS OF ABDOMINAL PELVIC ADHESIONS;  Surgeon: Terrance Mass, MD;  Location: [facility];  Service: Gynecology;;  . LAPAROSCOPIC UNILATERAL SALPINGO OOPHERECTOMY Right 06/28/2015   Procedure: LAPAROSCOPIC RIGHT SALPINGO OOPHORECTOMY;  Surgeon: Terrance Mass, MD;  Location: [facility];  Service: Gynecology;  Laterality: Right;  . LAPAROSCOPY  11/22/2011   Procedure: LAPAROSCOPY DIAGNOSTIC;  Surgeon: Terrance Mass, MD;  Location: [facility];  Service: Gynecology;  Laterality: N/A;  . OVARIAN CYST REMOVAL  11/22/2011   Procedure: OVARIAN CYSTECTOMY;  Surgeon: Terrance Mass, MD;  Location: [facility];  Service: Gynecology;  Laterality: Left;  . SALPINGECTOMY     left for ectopic pregnancy  . TUBAL LIGATION     right tubal ligation          Family History  Problem Relation Age of Onset  . Diabetes Brother   . Ovarian cancer Mother   . Ovarian cancer Paternal Aunt   . Heart disease Maternal Grandmother   . Breast cancer Maternal Grandmother     Age 74's  . Breast cancer Maternal Aunt     Age 49's    Social History:  reports that she has never smoked. She has never used smokeless tobacco. She reports that she does not drink alcohol or use drugs.  Allergies: No Known Allergies   (Not in a hospital admission)  REVIEW OF SYSTEMS: A ROS was performed and pertinent positives and negatives are included in the history.  GENERAL: No fevers or chills. HEENT: No change in vision, no earache, sore throat or sinus congestion. NECK: No pain or stiffness. CARDIOVASCULAR: No chest pain or pressure. No palpitations. PULMONARY: No shortness of breath, cough or wheeze. GASTROINTESTINAL: No abdominal pain, nausea, vomiting or diarrhea, melena or bright red blood per rectum.  GENITOURINARY: No urinary frequency, urgency, hesitancy or dysuria. MUSCULOSKELETAL: No joint or muscle pain, no back pain, no recent trauma. DERMATOLOGIC: No rash, no itching, no lesions. ENDOCRINE: No polyuria, polydipsia, no heat or cold intolerance. No recent change in weight. HEMATOLOGICAL: No anemia or easy bruising or bleeding. NEUROLOGIC: No headache, seizures, numbness, tingling or weakness. PSYCHIATRIC: No depression, no loss of interest in normal activity or change in sleep pattern.     Blood pressure 132/86, last menstrual period 11/10/2011.  Physical Exam: HEENT:unremarkable Neck:Supple, midline, no thyroid megaly, no carotid bruits Lungs: Clear to auscultation no rhonchi's or wheezes Heart:Regular rate and rhythm, no murmurs or gallops Breast Exam: Both breasts are symmetrical in appearance no palpable masses or tenderness no supraclavicular axillary lymphadenopathy Abdomen: Soft nontender no rebound or guarding Pelvic:BUS within normal limits Vagina: No lesions or discharge Cervix: Absent Uterus: Absent Adnexa: Tenderness in right lower quadrant Extremities: No cords, no edema Rectal: Unremarkable  No results found for this or any previous visit (from the past 24 hour(s)).  No results found.  Assessment/Plan: 50 year old with persistent left ovarian cyst and abdominal discomfort. 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 Patient will be scheduled for laparoscopic left salpingo-oophorectomy for persistently enlarged left  ovarian cyst with recent CA 125 being normal we'll recheck today as well. We had a detailed discussion of the risk of the operation and potential for laparotomy depending on the findings. Patient is ready to move forward. The following risk for discuss:                        Patient was counseled as to the risk of surgery to include the following:  1. Infection (prohylactic antibiotics will  be administered)  2. DVT/Pulmonary Embolism (prophylactic pneumo compression stockings will be used)  3.Trauma to internal organs requiring additional surgical procedure to repair any injury to     Internal organs requiring perhaps additional hospitalization days.  4.Hemmorhage requiring transfusion and blood products which carry risks such as             anaphylactic reaction, hepatitis and AIDS  Patient had received literature information on the procedure scheduled and all her questions were answered and fully accepts all risk.

## 2016-10-11 ENCOUNTER — Ambulatory Visit (HOSPITAL_COMMUNITY): Payer: BLUE CROSS/BLUE SHIELD | Admitting: Anesthesiology

## 2016-10-11 ENCOUNTER — Ambulatory Visit (HOSPITAL_COMMUNITY)
Admission: RE | Admit: 2016-10-11 | Discharge: 2016-10-11 | Disposition: A | Discharge status: Home / Self Care | Payer: BLUE CROSS/BLUE SHIELD | Source: Ambulatory Visit | Attending: Gynecology | Admitting: Gynecology

## 2016-10-11 ENCOUNTER — Encounter (HOSPITAL_COMMUNITY)
Admission: RE | Disposition: A | Discharge status: Home / Self Care | Payer: Self-pay | Source: Ambulatory Visit | Attending: Gynecology

## 2016-10-11 ENCOUNTER — Encounter (HOSPITAL_COMMUNITY): Payer: Self-pay | Admitting: Emergency Medicine

## 2016-10-11 DIAGNOSIS — N8312 Corpus luteum cyst of left ovary: Secondary | ICD-10-CM | POA: Diagnosis not present

## 2016-10-11 DIAGNOSIS — Z803 Family history of malignant neoplasm of breast: Secondary | ICD-10-CM | POA: Diagnosis not present

## 2016-10-11 DIAGNOSIS — K429 Umbilical hernia without obstruction or gangrene: Secondary | ICD-10-CM | POA: Insufficient documentation

## 2016-10-11 DIAGNOSIS — Z86010 Personal history of colonic polyps: Secondary | ICD-10-CM | POA: Insufficient documentation

## 2016-10-11 DIAGNOSIS — I10 Essential (primary) hypertension: Secondary | ICD-10-CM | POA: Insufficient documentation

## 2016-10-11 DIAGNOSIS — N9489 Other specified conditions associated with female genital organs and menstrual cycle: Secondary | ICD-10-CM | POA: Diagnosis not present

## 2016-10-11 DIAGNOSIS — Z833 Family history of diabetes mellitus: Secondary | ICD-10-CM | POA: Diagnosis not present

## 2016-10-11 DIAGNOSIS — K660 Peritoneal adhesions (postprocedural) (postinfection): Secondary | ICD-10-CM | POA: Insufficient documentation

## 2016-10-11 DIAGNOSIS — R102 Pelvic and perineal pain: Secondary | ICD-10-CM | POA: Insufficient documentation

## 2016-10-11 DIAGNOSIS — K219 Gastro-esophageal reflux disease without esophagitis: Secondary | ICD-10-CM | POA: Insufficient documentation

## 2016-10-11 DIAGNOSIS — Z90710 Acquired absence of both cervix and uterus: Secondary | ICD-10-CM | POA: Insufficient documentation

## 2016-10-11 DIAGNOSIS — E785 Hyperlipidemia, unspecified: Secondary | ICD-10-CM | POA: Diagnosis not present

## 2016-10-11 DIAGNOSIS — Z8249 Family history of ischemic heart disease and other diseases of the circulatory system: Secondary | ICD-10-CM | POA: Insufficient documentation

## 2016-10-11 DIAGNOSIS — F419 Anxiety disorder, unspecified: Secondary | ICD-10-CM | POA: Insufficient documentation

## 2016-10-11 DIAGNOSIS — Z8041 Family history of malignant neoplasm of ovary: Secondary | ICD-10-CM | POA: Insufficient documentation

## 2016-10-11 DIAGNOSIS — N83202 Unspecified ovarian cyst, left side: Secondary | ICD-10-CM | POA: Diagnosis not present

## 2016-10-11 DIAGNOSIS — D271 Benign neoplasm of left ovary: Secondary | ICD-10-CM | POA: Diagnosis not present

## 2016-10-11 HISTORY — PX: LAPAROSCOPIC UNILATERAL SALPINGO OOPHERECTOMY: SHX5935

## 2016-10-11 SURGERY — SALPINGO-OOPHORECTOMY, UNILATERAL, LAPAROSCOPIC
Anesthesia: General | Site: Abdomen | Laterality: Left

## 2016-10-11 MED ORDER — HEPARIN SODIUM (PORCINE) 5000 UNIT/ML IJ SOLN
INTRAMUSCULAR | Status: AC
  Filled 2016-10-11: qty 1

## 2016-10-11 MED ORDER — MIDAZOLAM HCL 2 MG/2ML IJ SOLN
INTRAMUSCULAR | Status: DC | PRN
  Administered 2016-10-11: 1 mg via INTRAVENOUS

## 2016-10-11 MED ORDER — ACETAMINOPHEN 160 MG/5ML PO SOLN: 325.0000 mg | ORAL | Status: DC | PRN

## 2016-10-11 MED ORDER — SUGAMMADEX SODIUM 200 MG/2ML IV SOLN
INTRAVENOUS | Status: AC
  Filled 2016-10-11: qty 2

## 2016-10-11 MED ORDER — METOCLOPRAMIDE HCL 10 MG PO TABS: 10.0000 mg | ORAL_TABLET | Freq: Three times a day (TID) | ORAL | 1 refills | Status: DC

## 2016-10-11 MED ORDER — BUPIVACAINE HCL (PF) 0.25 % IJ SOLN
INTRAMUSCULAR | Status: AC
  Filled 2016-10-11: qty 30

## 2016-10-11 MED ORDER — OXYCODONE HCL 5 MG PO TABS: 5.0000 mg | ORAL_TABLET | Freq: Once | ORAL | Status: DC | PRN

## 2016-10-11 MED ORDER — FENTANYL CITRATE (PF) 100 MCG/2ML IJ SOLN
25.0000 ug | INTRAMUSCULAR | Status: DC | PRN
  Administered 2016-10-11: 50 ug via INTRAVENOUS

## 2016-10-11 MED ORDER — ROCURONIUM BROMIDE 100 MG/10ML IV SOLN
INTRAVENOUS | Status: DC | PRN
  Administered 2016-10-11: 45 mg via INTRAVENOUS

## 2016-10-11 MED ORDER — SCOPOLAMINE 1 MG/3DAYS TD PT72
1.0000 | MEDICATED_PATCH | Freq: Once | TRANSDERMAL | Status: DC
  Administered 2016-10-11: 1.5 mg via TRANSDERMAL

## 2016-10-11 MED ORDER — DEXAMETHASONE SODIUM PHOSPHATE 4 MG/ML IJ SOLN
INTRAMUSCULAR | Status: AC
  Filled 2016-10-11: qty 1

## 2016-10-11 MED ORDER — KETOROLAC TROMETHAMINE 30 MG/ML IJ SOLN
INTRAMUSCULAR | Status: DC | PRN
  Administered 2016-10-11: 30 mg via INTRAVENOUS

## 2016-10-11 MED ORDER — FENTANYL CITRATE (PF) 250 MCG/5ML IJ SOLN
INTRAMUSCULAR | Status: AC
  Filled 2016-10-11: qty 5

## 2016-10-11 MED ORDER — LACTATED RINGERS IV SOLN
INTRAVENOUS | Status: DC
  Administered 2016-10-11 (×2): via INTRAVENOUS

## 2016-10-11 MED ORDER — ACETAMINOPHEN 325 MG PO TABS: 325.0000 mg | ORAL_TABLET | ORAL | Status: DC | PRN

## 2016-10-11 MED ORDER — SUGAMMADEX SODIUM 200 MG/2ML IV SOLN
INTRAVENOUS | Status: DC | PRN
  Administered 2016-10-11: 194.2 mg via INTRAVENOUS

## 2016-10-11 MED ORDER — FENTANYL CITRATE (PF) 100 MCG/2ML IJ SOLN
INTRAMUSCULAR | Status: DC | PRN
  Administered 2016-10-11 (×2): 50 ug via INTRAVENOUS

## 2016-10-11 MED ORDER — LIDOCAINE HCL (CARDIAC) 20 MG/ML IV SOLN
INTRAVENOUS | Status: AC
  Filled 2016-10-11: qty 5

## 2016-10-11 MED ORDER — ONDANSETRON HCL 4 MG/2ML IJ SOLN
INTRAMUSCULAR | Status: DC | PRN
  Administered 2016-10-11: 4 mg via INTRAVENOUS

## 2016-10-11 MED ORDER — BUPIVACAINE HCL (PF) 0.25 % IJ SOLN
INTRAMUSCULAR | Status: DC | PRN
  Administered 2016-10-11: 20 mL

## 2016-10-11 MED ORDER — HEPARIN SODIUM (PORCINE) 5000 UNIT/ML IJ SOLN
INTRAMUSCULAR | Status: DC | PRN
  Administered 2016-10-11: 5000 [IU]

## 2016-10-11 MED ORDER — ROCURONIUM BROMIDE 100 MG/10ML IV SOLN
INTRAVENOUS | Status: AC
  Filled 2016-10-11: qty 1

## 2016-10-11 MED ORDER — OXYCODONE HCL 5 MG/5ML PO SOLN: 5.0000 mg | Freq: Once | ORAL | Status: DC | PRN

## 2016-10-11 MED ORDER — PROPOFOL 10 MG/ML IV BOLUS
INTRAVENOUS | Status: AC
  Filled 2016-10-11: qty 20

## 2016-10-11 MED ORDER — METHYLENE BLUE 0.5 % INJ SOLN
INTRAVENOUS | Status: AC
  Filled 2016-10-11: qty 10

## 2016-10-11 MED ORDER — MIDAZOLAM HCL 2 MG/2ML IJ SOLN
INTRAMUSCULAR | Status: AC
  Filled 2016-10-11: qty 2

## 2016-10-11 MED ORDER — KETOROLAC TROMETHAMINE 30 MG/ML IJ SOLN
INTRAMUSCULAR | Status: AC
  Filled 2016-10-11: qty 1

## 2016-10-11 MED ORDER — DEXAMETHASONE SODIUM PHOSPHATE 10 MG/ML IJ SOLN
INTRAMUSCULAR | Status: DC | PRN
Start: 2016-10-11 — End: 2016-10-11
  Administered 2016-10-11: 4 mg via INTRAVENOUS

## 2016-10-11 MED ORDER — SCOPOLAMINE 1 MG/3DAYS TD PT72
MEDICATED_PATCH | TRANSDERMAL | Status: AC
  Administered 2016-10-11: 1.5 mg via TRANSDERMAL
  Filled 2016-10-11: qty 1

## 2016-10-11 MED ORDER — KETOROLAC TROMETHAMINE 30 MG/ML IJ SOLN: 30.0000 mg | Freq: Once | INTRAMUSCULAR | Status: DC

## 2016-10-11 MED ORDER — ONDANSETRON HCL 4 MG/2ML IJ SOLN: 4.0000 mg | Freq: Once | INTRAMUSCULAR | Status: DC | PRN

## 2016-10-11 MED ORDER — LIDOCAINE HCL (CARDIAC) 20 MG/ML IV SOLN
INTRAVENOUS | Status: DC | PRN
  Administered 2016-10-11: 5 mg via INTRAVENOUS
  Administered 2016-10-11: 30 mg via INTRAVENOUS

## 2016-10-11 MED ORDER — MEPERIDINE HCL 25 MG/ML IJ SOLN: 6.2500 mg | INTRAMUSCULAR | Status: DC | PRN

## 2016-10-11 MED ORDER — PROPOFOL 10 MG/ML IV BOLUS
INTRAVENOUS | Status: DC | PRN
  Administered 2016-10-11: 180 mg via INTRAVENOUS
  Administered 2016-10-11: 20 mg via INTRAVENOUS

## 2016-10-11 MED ORDER — FENTANYL CITRATE (PF) 100 MCG/2ML IJ SOLN
INTRAMUSCULAR | Status: AC
  Filled 2016-10-11: qty 2

## 2016-10-11 MED ORDER — ONDANSETRON HCL 4 MG/2ML IJ SOLN
INTRAMUSCULAR | Status: AC
  Filled 2016-10-11: qty 2

## 2016-10-11 MED ORDER — LACTATED RINGERS IR SOLN
Status: DC | PRN
  Administered 2016-10-11: 3000 mL

## 2016-10-11 MED ORDER — OXYCODONE-ACETAMINOPHEN 5-325 MG PO TABS: 1.0000 | ORAL_TABLET | ORAL | 0 refills | Status: DC | PRN

## 2016-10-11 SURGICAL SUPPLY — 45 items
ADH SKN CLS APL DERMABOND .7 (GAUZE/BANDAGES/DRESSINGS) ×2
APL SRG 38 LTWT LNG FL B (MISCELLANEOUS)
APPLICATOR ARISTA FLEXITIP XL (MISCELLANEOUS) IMPLANT
BAG SPEC RTRVL LRG 6X4 10 (ENDOMECHANICALS) ×2
BARRIER ADHS 3X4 INTERCEED (GAUZE/BANDAGES/DRESSINGS) IMPLANT
BRR ADH 4X3 ABS CNTRL BYND (GAUZE/BANDAGES/DRESSINGS)
CABLE HIGH FREQUENCY MONO STRZ (ELECTRODE) ×3 IMPLANT
CELLS DAT CNTRL 66122 CELL SVR (MISCELLANEOUS) IMPLANT
CLOTH BEACON ORANGE TIMEOUT ST (SAFETY) ×4 IMPLANT
COVER MAYO STAND STRL (DRAPES) ×4 IMPLANT
DERMABOND ADVANCED (GAUZE/BANDAGES/DRESSINGS) ×2
DERMABOND ADVANCED .7 DNX12 (GAUZE/BANDAGES/DRESSINGS) ×1 IMPLANT
DURAPREP 26ML APPLICATOR (WOUND CARE) ×4 IMPLANT
FILTER SMOKE EVAC LAPAROSHD (FILTER) ×4 IMPLANT
GAUZE SPONGE 4X4 16PLY XRAY LF (GAUZE/BANDAGES/DRESSINGS) IMPLANT
GLOVE BIOGEL PI IND STRL 7.0 (GLOVE) ×2 IMPLANT
GLOVE BIOGEL PI IND STRL 8 (GLOVE) ×2 IMPLANT
GLOVE BIOGEL PI INDICATOR 7.0 (GLOVE) ×2
GLOVE BIOGEL PI INDICATOR 8 (GLOVE) ×2
GLOVE ECLIPSE 7.5 STRL STRAW (GLOVE) ×8 IMPLANT
GOWN STRL REUS W/TWL LRG LVL3 (GOWN DISPOSABLE) ×8 IMPLANT
HEMOSTAT ARISTA ABSORB 3G PWDR (MISCELLANEOUS) IMPLANT
NS IRRIG 1000ML POUR BTL (IV SOLUTION) ×4 IMPLANT
PACK LAPAROSCOPY BASIN (CUSTOM PROCEDURE TRAY) ×4 IMPLANT
PACK TRENDGUARD 450 HYBRID PRO (MISCELLANEOUS) ×1 IMPLANT
POUCH LAPAROSCOPIC INSTRUMENT (MISCELLANEOUS) ×4 IMPLANT
POUCH SPECIMEN RETRIEVAL 10MM (ENDOMECHANICALS) ×3 IMPLANT
PROTECTOR NERVE ULNAR (MISCELLANEOUS) ×8 IMPLANT
RETRACTOR WND ALEXIS 18 MED (MISCELLANEOUS) IMPLANT
RETRACTOR WND ALEXIS 25 LRG (MISCELLANEOUS) IMPLANT
RTRCTR WOUND ALEXIS 18CM MED (MISCELLANEOUS)
RTRCTR WOUND ALEXIS 25CM LRG (MISCELLANEOUS)
SET IRRIG TUBING LAPAROSCOPIC (IRRIGATION / IRRIGATOR) ×4 IMPLANT
SHEARS HARMONIC ACE PLUS 36CM (ENDOMECHANICALS) IMPLANT
SLEEVE XCEL OPT CAN 5 100 (ENDOMECHANICALS) ×4 IMPLANT
SOLUTION ELECTROLUBE (MISCELLANEOUS) IMPLANT
SUT VIC AB 3-0 PS2 18 (SUTURE) ×4
SUT VIC AB 3-0 PS2 18XBRD (SUTURE) ×2 IMPLANT
SUT VICRYL 0 UR6 27IN ABS (SUTURE) ×4 IMPLANT
TOWEL OR 17X24 6PK STRL BLUE (TOWEL DISPOSABLE) ×8 IMPLANT
TRAY FOLEY CATH SILVER 14FR (SET/KITS/TRAYS/PACK) ×4 IMPLANT
TRENDGUARD 450 HYBRID PRO PACK (MISCELLANEOUS) ×4
TROCAR XCEL NON-BLD 11X100MML (ENDOMECHANICALS) ×4 IMPLANT
TROCAR XCEL NON-BLD 5MMX100MML (ENDOMECHANICALS) ×4 IMPLANT
WARMER LAPAROSCOPE (MISCELLANEOUS) ×4 IMPLANT

## 2016-10-11 NOTE — Interval H&P Note (Signed)
History and Physical Interval Note:  10/11/2016 7:16 AM  Madeline Hawkins  has presented today for surgery, with the diagnosis of persistent left ovarian cyst, pelvic pain  The various methods of treatment have been discussed with the patient and family. After consideration of risks, benefits and other options for treatment, the patient has consented to  Procedure(s) with comments: LAPAROSCOPIC UNILATERAL SALPINGO OOPHORECTOMY (Left) - [place] (N/A) as a surgical intervention .  The patient's history has been reviewed, patient examined, no change in status, stable for surgery.  I have reviewed the patient's chart and labs.  Questions were answered to the patient's satisfaction.     Terrance Mass

## 2016-10-11 NOTE — Op Note (Signed)
Operative Note  10/11/2016  8:32 AM  PATIENT:  Madeline Hawkins  50 y.o. female  PRE-OPERATIVE DIAGNOSIS:  persistent left ovarian cyst, pelvic pain  POST-OPERATIVE DIAGNOSIS:  pelvic pain, persistent left ovarian cyst  PROCEDURE:  Procedure(s): LAPAROSCOPIC UNILATERAL OOPHORECTOMY with pelvic washings  SURGEON:  Surgeon(s): Terrance Mass, MD Anastasio Auerbach, MD   ANESTHESIA:   general  FINDINGS: Multicystic left ovary smooth surface absent left fallopian tube absent right adnexa absent uterus and cervix no abdominal pelvic adhesions seen  DESCRIPTION OF OPERATION: The patient was taken to the operating room where she underwent successful general endotracheal anesthesia. A timeout was undertaken to voice allowed the procedure to be performed. After timeout was completed the abdomen vagina and perineum were prepped and draped in usual sterile fashion and a Foley catheter had been inserted to monitor urinary output. Patient preoperatively received 2 g Cefotan IV and had PAS stockings for DVT prophylaxis. An NG tube was in place to decompress stomach due to the fact patient had multiple operations in the past. An upper left upper quadrant small incision was made 3 finger length from the left lower costal margin midline and a 5 mm trocar Optiview was introduced. Once safe entrance was accomplished. We were able to look underneath the umbilicus and there was no evidence of any omentum adhered so a sub-umbilicus incision was made followed by introduction of a 10/11 mm Optiview trocar. A third incision was made in the right lower abdomen under laparoscopic view and a 5 mm trocar was inserted as well. The sleeves were left in place a systematic inspection demonstrated the above-mentioned finding. The left ovary was placed under tension the left ureter was identified and the left infundibulopelvic ligament was coaptated and transected with Harmonic scalpel freeing the entire ovary and keeping  the ureter far from the excision site. Of note prior to removal of the left tube and pelvic washings had been obtained for cytological evaluation. Through the 10/11 mm incision port and laparoscopic Endopouch was introduced and the left ovary was retrieved and passed off the operative field for histological evaluation. Once again the pelvic cavity was inspected adequate hemostasis was present the pneumoperitoneum was removed sponge count needle count were correct the subumbilical fascia was closed with a figure-of-eight of 0 Vicryl suture and the subcutaneous tissue was reapproximated with 3-0 Vicryl suture and all 3 incision port skin incisions were reapproximated with Dermabond glue after quarter percent Marcaine had been infiltrated on all 3 incision port for a total 20 cc for postoperative analgesia. Patient was awakened pressure to recovery room stable vital signs she received Toradol 30 mg IV in route to the recovery room.  ESTIMATED BLOOD LOSS: Less than 10 cc   Intake/Output Summary (Last 24 hours) at 10/11/16 [id number] Last data filed at 10/11/16 [id number]  Gross per 24 hour  Intake             1000 ml  Output              130 ml  Net              870 ml     BLOOD ADMINISTERED:none   LOCAL MEDICATIONS USED:  MARCAINE   0.25% for a total 20 cc in all 3 incision ports for postop analgesia  SPECIMEN:  Source of Specimen:  Left oophorectomy  DISPOSITION OF SPECIMEN:  PATHOLOGY  COUNTS:  YES  PLAN OF CARE: Transfer to PACU  [facility] HMD8:32 AMTD@

## 2016-10-11 NOTE — Transfer of Care (Signed)
Immediate Anesthesia Transfer of Care Note  Patient: KHARIS SCHUNEMAN  Procedure(s) Performed: Procedure(s) with comments: LAPAROSCOPIC UNILATERAL OOPHORECTOMY with pelvic washings (Left) - HARMONIC SCAPEL  Patient Location: PACU  Anesthesia Type:General  Level of Consciousness: awake, oriented, sedated and patient cooperative  Airway & Oxygen Therapy: Patient Spontanous Breathing and Patient connected to nasal cannula oxygen  Post-op Assessment: Report given to RN and Post -op Vital signs reviewed and stable  Post vital signs: Reviewed and stable  Last Vitals:  Vitals:   10/11/16 0616  BP: (!) 142/84  Pulse: 82  Resp: 16  Temp: 36.7 C    Last Pain:  Vitals:   10/11/16 0616  TempSrc: Oral      Patients Stated Pain Goal: 4 ([id number])  Complications: No apparent anesthesia complications

## 2016-10-11 NOTE — Anesthesia Postprocedure Evaluation (Signed)
Anesthesia Post Note  Patient: Madeline Hawkins  Procedure(s) Performed: Procedure(s) (LRB): LAPAROSCOPIC UNILATERAL OOPHORECTOMY with pelvic washings (Left)  Patient location during evaluation: PACU Anesthesia Type: General Level of consciousness: awake and sedated Pain management: pain level controlled Vital Signs Assessment: post-procedure vital signs reviewed and stable Respiratory status: spontaneous breathing Cardiovascular status: blood pressure returned to baseline and stable Postop Assessment: no signs of nausea or vomiting Anesthetic complications: no     Last Vitals:  Vitals:   10/11/16 0930 10/11/16 0945  BP: 104/74 105/76  Pulse: 87 85  Resp: 10 13  Temp:      Last Pain:  Vitals:   10/11/16 0915  TempSrc:   PainSc: 5    Pain Goal: Patients Stated Pain Goal: 4 (10/11/16 0616)               Dorthy Magnussen JR,JOHN Mateo [facility]

## 2016-10-11 NOTE — Discharge Instructions (Addendum)
DISCHARGE INSTRUCTIONS: Laparoscopy  The following instructions have been prepared to help you care for yourself upon your return home today.  **You may begin taking Ibuprofen(Motrin, Advil, Aleve) after 2:00 pm today**  Wound care:  Do not get the incision wet for the first 24 hours. The incision should be kept clean and dry.  The Band-Aids or dressings may be removed the second day after surgery(Saturday).  Should the incision become sore, red, and swollen after the first week, check with your doctor.  Personal hygiene:  Shower the day after your procedure.  Activity and limitations:  Do NOT drive or operate any equipment for 1 week.  Do NOT lift anything more than 10-15 pounds for 2 weeks after surgery.  Do NOT rest in bed all day.  Walking is encouraged. Walk each day, starting slowly with 5-minute walks 3 or 4 times a day. Slowly increase the length of your walks.  Walk up and down stairs slowly.  Do NOT do strenuous activities, such as golfing, playing tennis, bowling, running, biking, weight lifting, gardening, mowing, or vacuuming for 2-4 weeks. Ask your doctor when it is okay to start.  Sexual activity: -No sex for 6 weeks  Diet: Eat a light meal as desired this evening. You may resume your usual diet tomorrow.  Return to work: This is dependent on the type of work you do. For the most part you can return to a desk job within a week of surgery. If you are more active at work, please discuss this with your doctor.  What to expect after your surgery: You may have a slight burning sensation when you urinate on the first day. You may have a very small amount of blood in the urine. Expect to have a small amount of vaginal discharge/light bleeding for 1-2 weeks. It is not unusual to have abdominal soreness and bruising for up to 2 weeks. You may be tired and need more rest for about 1 week. You may experience shoulder pain for 24-72 hours. Lying flat in bed may relieve  it.  Call your doctor for any of the following:  Develop a fever of 100.4 or greater  Inability to urinate 6 hours after discharge from hospital  Severe pain not relieved by pain medications  Persistent of heavy bleeding at incision site  Redness or swelling around incision site after a week  Increasing nausea or vomiting  Patient Signature________________________________________  Nurse Signature_________________________________________  Support person's signature___________________________________

## 2016-10-12 ENCOUNTER — Encounter (HOSPITAL_COMMUNITY): Payer: Self-pay | Admitting: Gynecology

## 2016-10-16 ENCOUNTER — Other Ambulatory Visit: Payer: Self-pay | Admitting: Gynecology

## 2016-10-16 ENCOUNTER — Encounter: Payer: Self-pay | Admitting: Gynecology

## 2016-10-16 MED ORDER — FLUCONAZOLE 150 MG PO TABS: 150.0000 mg | ORAL_TABLET | Freq: Once | ORAL | 0 refills | Status: DC

## 2016-10-16 MED ORDER — FLUCONAZOLE 150 MG PO TABS: ORAL_TABLET | ORAL | 0 refills | Status: DC

## 2016-10-16 NOTE — Telephone Encounter (Signed)
Dr. JF-Patient said she is red externally. She said urine is burning it badly. She is having white, thick discharge and terrible itching.

## 2016-10-25 ENCOUNTER — Encounter: Payer: Self-pay | Admitting: Gynecology

## 2016-10-25 ENCOUNTER — Ambulatory Visit (INDEPENDENT_AMBULATORY_CARE_PROVIDER_SITE_OTHER): Payer: BLUE CROSS/BLUE SHIELD | Admitting: Gynecology

## 2016-10-25 VITALS — BP 126/80 | Ht 64.0 in | Wt 214.0 lb

## 2016-10-25 DIAGNOSIS — Z09 Encounter for follow-up examination after completed treatment for conditions other than malignant neoplasm: Secondary | ICD-10-CM

## 2016-10-25 MED ORDER — ESTRADIOL 0.5 MG PO TABS: 0.5000 mg | ORAL_TABLET | Freq: Every day | ORAL | 11 refills | Status: DC

## 2016-10-25 NOTE — Progress Notes (Signed)
   Patient is a 50 year old that presented to the office for her two-week postop visit. On December 14 patient underwent laparoscopic left oophorectomy as a result of her chronic pelvic pain and ovarian cyst. Patient with prior history of hysterectomy as well as right salpingo-oophorectomy and left salpingectomy several years ago. Patient is doing well asymptomatic with the exception that she starts experiencing hot flashes now that both ovaries are gone she's having difficulty sleeping at times but hot flashes are very far in between during the daytime. We discussed the findings from surgery, pictures were shown as well as pathology report discussed as follows:  FINDINGS: Multicystic left ovary smooth surface absent left fallopian tube absent right adnexa absent uterus and cervix no abdominal pelvic adhesions seen  Pathology report:Diagnosis Ovary, left - BENIGN FOLLICULAR CYST AND HEMORRHAGIC CORPUS LUTEUM. - FOCAL ENDOSALPINGIOSIS. - NO ENDOMETRIOSIS OR EVIDENCE OF MALIGNANCY  Exam: Abdominal ports intact. Abdomen soft nontender no rebound or guarding Pelvic: Bartholin urethra Skene was within normal limits Vaginal cuff intact Bimanual exam no palpable masses or tenderness Rectal exam not done  Assessment/plan: Patient status post removal of her remaining left ovary as a result of chronic pelvic pain. Patient with menopausal symptoms we discussed starting hormone replacement therapy her insurance company would not cover any transdermal products so she will be started on Estrace 1 mg by mouth daily. The risks benefits and pros and cons of the medication were discussed. Risk for DVT, pulmonary embolism and breast cancer were discussed. Also the women's health initiative study as well as new guidelines discussed. Patient fully understands risk and accepts. Literature information provided. Patient to return in 6 months for follow-up and annual exam. She may resume normal activity.

## 2016-10-25 NOTE — Patient Instructions (Signed)
Menopause and Hormone Replacement Therapy Introduction WHAT IS HORMONE REPLACEMENT THERAPY? Hormone replacement therapy (HRT) is the use of artificial (synthetic) hormones to replace hormones that your body stops producing during menopause. Menopause is the normal time of life when menstrual periods stop completely and the ovaries stop producing the female hormones estrogen and progesterone. This lack of hormones can affect your health and cause undesirable symptoms. HRT can relieve some of those symptoms. WHAT ARE MY OPTIONS FOR HRT? HRT may consist of the synthetic hormones estrogen and progestin, or it may consist of only estrogen (estrogen-only therapy). You and your health care provider will decide which form of HRT is best for you. If you choose to be on HRT and you have a uterus, estrogen and progestin are usually prescribed. Estrogen-only therapy is used for women who do not have a uterus. Possible options for taking HRT include:  Pills.  Patches.  Gels.  Sprays.  Vaginal cream.  Vaginal rings.  Vaginal inserts. The amount of hormone(s) that you take and how long you take the hormone(s) varies depending on your individual health. It is important to:  Begin HRT with the lowest possible dosage.  Stop HRT as soon as your health care provider tells you to stop.  Work with your health care provider so that you feel informed and comfortable with your decisions. WHAT ARE THE BENEFITS OF HRT? HRT can reduce the frequency and severity of menopausal symptoms. Benefits of HRT vary depending on the menopausal symptoms that you have, the severity of your symptoms, and your overall health. HRT may help to improve the following menopausal symptoms:  Hot flashes and night sweats. These are sudden feelings of heat that spread over the face and body. The skin may turn red, like a blush. Night sweats are hot flashes that happen while you are sleeping or trying to sleep.  Bone loss  (osteoporosis). The body loses calcium more quickly after menopause, causing the bones to become weaker. This can increase the risk for bone breaks (fractures).  Vaginal dryness. The lining of the vagina can become thin and dry, which can cause pain during sexual intercourse or cause infection, burning, or itching.  Urinary tract infections.  Urinary incontinence. This is a decreased ability to control when you urinate.  Irritability.  Short-term memory problems. WHAT ARE THE RISKS OF HRT? Risks of HRT vary depending on your individual health and medical history. Risks of HRT also depend on whether you receive both estrogen and progestin or you receive estrogen only.HRT may increase the risk of:  Spotting. This is when a small amount of bloodleaks from the vagina unexpectedly.  Endometrial cancer. This cancer is in the lining of the uterus (endometrium).  Breast cancer.  Increased density of breast tissue. This can make it harder to find breast cancer on a breast X-ray (mammogram).  Stroke.  Heart attack.  Blood clots.  Gallbladder disease. Risks of HRT can increase if you have any of the following conditions:  Endometrial cancer.  Liver disease.  Heart disease.  Breast cancer.  History of blood clots.  History of stroke. HOW SHOULD I CARE FOR MYSELF WHILE I AM ON HRT?  Take over-the-counter and prescription medicines only as told by your health care provider.  Get mammograms, pelvic exams, and medical checkups as often as told by your health care provider.  Have Pap tests done as often as told by your health care provider. A Pap test is sometimes called a Pap smear. It is a  screening test that is used to check for signs of cancer of the cervix and vagina. A Pap test can also identify the presence of infection or precancerous changes. Pap tests may be done:  Every 3 years, starting at age 64.  Every 5 years, starting after age 56, in combination with testing for  human papillomavirus (HPV).  More often or less often depending on other medical conditions you have, your age, and other risk factors.  It is your responsibility to get your Pap test results. Ask your health care provider or the department performing the test when your results will be ready.  Keep all follow-up visits as told by your health care provider. This is important. WHEN SHOULD I SEEK MEDICAL CARE? Talk with your health care provider if:  You have any of these:  Pain or swelling in your legs.  Shortness of breath.  Chest pain.  Lumps or changes in your breasts or armpits.  Slurred speech.  Pain, burning, or bleeding when you urine.  You develop any of these:  Unusual vaginal bleeding.  Dizziness or headaches.  Weakness or numbness in any part of your arms or legs.  Pain in your abdomen. This information is not intended to replace advice given to you by your health care provider. Make sure you discuss any questions you have with your health care provider. Document Released: 07/14/2003 Document Revised: 03/22/2016 Document Reviewed: 04/18/2015  2017 Elsevier

## 2016-11-04 ENCOUNTER — Other Ambulatory Visit: Payer: Self-pay | Admitting: Physician Assistant

## 2016-11-04 DIAGNOSIS — R1013 Epigastric pain: Secondary | ICD-10-CM

## 2016-11-05 NOTE — Telephone Encounter (Signed)
Last ov 08/31/16  Last cmp 08/31/16

## 2016-11-12 ENCOUNTER — Other Ambulatory Visit: Payer: Self-pay | Admitting: Physician Assistant

## 2016-12-25 ENCOUNTER — Other Ambulatory Visit: Payer: Self-pay | Admitting: Physician Assistant

## 2017-01-14 ENCOUNTER — Other Ambulatory Visit: Payer: Self-pay | Admitting: *Deleted

## 2017-01-14 MED ORDER — ESTRADIOL 0.5 MG PO TABS
0.5000 mg | ORAL_TABLET | Freq: Every day | ORAL | 2 refills | Status: DC
Start: 2017-01-14 — End: 2017-11-28

## 2017-01-31 ENCOUNTER — Other Ambulatory Visit: Payer: Self-pay | Admitting: Physician Assistant

## 2017-02-01 ENCOUNTER — Other Ambulatory Visit: Payer: Self-pay | Admitting: Physician Assistant

## 2017-02-04 ENCOUNTER — Encounter: Payer: Self-pay | Admitting: Physician Assistant

## 2017-02-04 ENCOUNTER — Ambulatory Visit (INDEPENDENT_AMBULATORY_CARE_PROVIDER_SITE_OTHER): Payer: BLUE CROSS/BLUE SHIELD | Admitting: Physician Assistant

## 2017-02-04 VITALS — BP 128/88 | HR 81 | Temp 98.1°F | Resp 14 | Ht 64.0 in | Wt 215.0 lb

## 2017-02-04 DIAGNOSIS — I10 Essential (primary) hypertension: Secondary | ICD-10-CM | POA: Diagnosis not present

## 2017-02-04 DIAGNOSIS — E785 Hyperlipidemia, unspecified: Secondary | ICD-10-CM

## 2017-02-04 LAB — LIPID PANEL
CHOL/HDL RATIO: 4
Cholesterol: 178 mg/dL (ref 0–200)
HDL: 45 mg/dL (ref >39.00)
LDL Cholesterol: 117 mg/dL — ABNORMAL HIGH (ref 0–99)
NONHDL: 133.4
Triglycerides: 81 mg/dL (ref 0.0–149.0)
VLDL: 16.2 mg/dL (ref 0.0–40.0)

## 2017-02-04 LAB — COMPREHENSIVE METABOLIC PANEL
ALK PHOS: 71 U/L (ref 39–117)
ALT: 22 U/L (ref 0–35)
AST: 20 U/L (ref 0–37)
Albumin: 4.4 g/dL (ref 3.5–5.2)
BILIRUBIN TOTAL: 0.4 mg/dL (ref 0.2–1.2)
BUN: 10 mg/dL (ref 6–23)
CO2: 31 mEq/L (ref 19–32)
Calcium: 9.9 mg/dL (ref 8.4–10.5)
Chloride: 103 mEq/L (ref 96–112)
Creatinine, Ser: 0.78 mg/dL (ref 0.40–1.20)
GFR: 101.12 mL/min (ref >60.00)
GLUCOSE: 84 mg/dL (ref 70–99)
Potassium: 4.2 mEq/L (ref 3.5–5.1)
SODIUM: 141 meq/L (ref 135–145)
TOTAL PROTEIN: 7.1 g/dL (ref 6.0–8.3)

## 2017-02-04 NOTE — Assessment & Plan Note (Signed)
Taking statin as directed. Discussed dietary and exercise recommendations. Fasting lipids today. Will alter based on results.

## 2017-02-04 NOTE — Progress Notes (Signed)
Pre visit review using our clinic review tool, if applicable. No additional management support is needed unless otherwise documented below in the visit note. 

## 2017-02-04 NOTE — Patient Instructions (Signed)
Please go to the lab for blood work. I will call you with your results.  We will alter regimen accordingly. Please work in increasing aerobic exercise -- goal of at least 150 minutes per week. Elevate legs while resting.   Follow-up will be based on results.   DASH Eating Plan DASH stands for "Dietary Approaches to Stop Hypertension." The DASH eating plan is a healthy eating plan that has been shown to reduce high blood pressure (hypertension). It may also reduce your risk for type 2 diabetes, heart disease, and stroke. The DASH eating plan may also help with weight loss. What are tips for following this plan? General guidelines   Avoid eating more than 2,300 mg (milligrams) of salt (sodium) a day. If you have hypertension, you may need to reduce your sodium intake to 1,500 mg a day.  Limit alcohol intake to no more than 1 drink a day for nonpregnant women and 2 drinks a day for men. One drink equals 12 oz of beer, 5 oz of wine, or 1 oz of hard liquor.  Work with your health care provider to maintain a healthy body weight or to lose weight. Ask what an ideal weight is for you.  Get at least 30 minutes of exercise that causes your heart to beat faster (aerobic exercise) most days of the week. Activities may include walking, swimming, or biking.  Work with your health care provider or diet and nutrition specialist (dietitian) to adjust your eating plan to your individual calorie needs. Reading food labels   Check food labels for the amount of sodium per serving. Choose foods with less than 5 percent of the Daily Value of sodium. Generally, foods with less than 300 mg of sodium per serving fit into this eating plan.  To find whole grains, look for the word "whole" as the first word in the ingredient list. Shopping   Buy products labeled as "low-sodium" or "no salt added."  Buy fresh foods. Avoid canned foods and premade or frozen meals. Cooking   Avoid adding salt when cooking. Use  salt-free seasonings or herbs instead of table salt or sea salt. Check with your health care provider or pharmacist before using salt substitutes.  Do not fry foods. Cook foods using healthy methods such as baking, boiling, grilling, and broiling instead.  Cook with heart-healthy oils, such as olive, canola, soybean, or sunflower oil. Meal planning    Eat a balanced diet that includes:  5 or more servings of fruits and vegetables each day. At each meal, try to fill half of your plate with fruits and vegetables.  Up to 6-8 servings of whole grains each day.  Less than 6 oz of lean meat, poultry, or fish each day. A 3-oz serving of meat is about the same size as a deck of cards. One egg equals 1 oz.  2 servings of low-fat dairy each day.  A serving of nuts, seeds, or beans 5 times each week.  Heart-healthy fats. Healthy fats called Omega-3 fatty acids are found in foods such as flaxseeds and coldwater fish, like sardines, salmon, and mackerel.  Limit how much you eat of the following:  Canned or prepackaged foods.  Food that is high in trans fat, such as fried foods.  Food that is high in saturated fat, such as fatty meat.  Sweets, desserts, sugary drinks, and other foods with added sugar.  Full-fat dairy products.  Do not salt foods before eating.  Try to eat at least 2  vegetarian meals each week.  Eat more home-cooked food and less restaurant, buffet, and fast food.  When eating at a restaurant, ask that your food be prepared with less salt or no salt, if possible. What foods are recommended? The items listed may not be a complete list. Talk with your dietitian about what dietary choices are best for you. Grains  Whole-grain or whole-wheat bread. Whole-grain or whole-wheat pasta. Brown rice. [place]. Bulgur. Whole-grain and low-sodium cereals. Pita bread. Low-fat, low-sodium crackers. Whole-wheat flour tortillas. Vegetables  Fresh or frozen vegetables (raw,  steamed, roasted, or grilled). Low-sodium or reduced-sodium tomato and vegetable juice. Low-sodium or reduced-sodium tomato sauce and tomato paste. Low-sodium or reduced-sodium canned vegetables. Fruits  All fresh, dried, or frozen fruit. Canned fruit in natural juice (without added sugar). Meat and other protein foods  Skinless chicken or [place]. Ground chicken or [place]. Pork with fat trimmed off. Fish and seafood. Egg whites. Dried beans, peas, or lentils. Unsalted nuts, nut butters, and seeds. Unsalted canned beans. Lean cuts of beef with fat trimmed off. Low-sodium, lean deli meat. Dairy  Low-fat (1%) or fat-free (skim) milk. Fat-free, low-fat, or reduced-fat cheeses. Nonfat, low-sodium ricotta or cottage cheese. Low-fat or nonfat yogurt. Low-fat, low-sodium cheese. Fats and oils  Soft margarine without trans fats. Vegetable oil. Low-fat, reduced-fat, or light mayonnaise and salad dressings (reduced-sodium). Canola, safflower, olive, soybean, and sunflower oils. Avocado. Seasoning and other foods  Herbs. Spices. Seasoning mixes without salt. Unsalted popcorn and pretzels. Fat-free sweets. What foods are not recommended? The items listed may not be a complete list. Talk with your dietitian about what dietary choices are best for you. Grains  Baked goods made with fat, such as croissants, muffins, or some breads. Dry pasta or rice meal packs. Vegetables  Creamed or fried vegetables. Vegetables in a cheese sauce. Regular canned vegetables (not low-sodium or reduced-sodium). Regular canned tomato sauce and paste (not low-sodium or reduced-sodium). Regular tomato and vegetable juice (not low-sodium or reduced-sodium). Angie Fava. Olives. Fruits  Canned fruit in a light or heavy syrup. Fried fruit. Fruit in cream or butter sauce. Meat and other protein foods  Fatty cuts of meat. Ribs. Fried meat. Berniece Salines. Sausage. Bologna and other processed lunch meats. Salami. Fatback. Hotdogs. Bratwurst. Salted nuts  and seeds. Canned beans with added salt. Canned or smoked fish. Whole eggs or egg yolks. Chicken or [place] with skin. Dairy  Whole or 2% milk, cream, and half-and-half. Whole or full-fat cream cheese. Whole-fat or sweetened yogurt. Full-fat cheese. Nondairy creamers. Whipped toppings. Processed cheese and cheese spreads. Fats and oils  Butter. Stick margarine. Lard. Shortening. Ghee. Bacon fat. Tropical oils, such as coconut, palm kernel, or palm oil. Seasoning and other foods  Salted popcorn and pretzels. Onion salt, garlic salt, seasoned salt, table salt, and sea salt. Worcestershire sauce. Tartar sauce. Barbecue sauce. Teriyaki sauce. Soy sauce, including reduced-sodium. Steak sauce. Canned and packaged gravies. Fish sauce. Oyster sauce. Cocktail sauce. Horseradish that you find on the shelf. Ketchup. Mustard. Meat flavorings and tenderizers. Bouillon cubes. Hot sauce and Tabasco sauce. Premade or packaged marinades. Premade or packaged taco seasonings. Relishes. Regular salad dressings. Where to find more information:  National Heart, Lung, and [place]: https://wilson-eaton.com/  American Heart Association: www.heart.org Summary  The DASH eating plan is a healthy eating plan that has been shown to reduce high blood pressure (hypertension). It may also reduce your risk for type 2 diabetes, heart disease, and stroke.  With the DASH eating plan, you should limit salt (  sodium) intake to 2,300 mg a day. If you have hypertension, you may need to reduce your sodium intake to 1,500 mg a day.  When on the DASH eating plan, aim to eat more fresh fruits and vegetables, whole grains, lean proteins, low-fat dairy, and heart-healthy fats.  Work with your health care provider or diet and nutrition specialist (dietitian) to adjust your eating plan to your individual calorie needs. This information is not intended to replace advice given to you by your health care provider. Make sure you discuss any  questions you have with your health care provider. Document Released: 10/04/2011 Document Revised: 10/08/2016 Document Reviewed: 10/08/2016 Elsevier Interactive Patient Education  2017 [employer].

## 2017-02-04 NOTE — Progress Notes (Signed)
Patient presents to clinic today for follow-up of hypertension and hyperlipidemia.   Hypertension -- Is currently on a regimen of Hydrochlorothiazide 12.5 mg daily. Patient denies chest pain, palpitations, lightheadedness, vision changes or frequent headaches. Rare dizziness if standing too quickly.  Lasting a couple of seconds and then resolving. No ongoing issues. Endorses well-balanced diet overall. Does not a sweet tooth. Is not taking her Klor-Con.  Hyperlipidemia -- Is currently on Lipitor 10 mg daily. Is taking as directed. Is not exercising at present. Body mass index is 36.9 kg/m.   Past Medical History:  Diagnosis Date  . Anxiety   . CIN I (cervical intraepithelial neoplasia I) 1989   cryo...also in 2008 .. had cryo   . GERD (gastroesophageal reflux disease)   . Hyperlipidemia   . Hypertension     Current Outpatient Prescriptions on File Prior to Visit  Medication Sig Dispense Refill  . atorvastatin (LIPITOR) 10 MG tablet TAKE 1 TABLET (10 MG TOTAL) BY MOUTH DAILY. 30 tablet 0  . estradiol (ESTRACE) 0.5 MG tablet Take 1 tablet (0.5 mg total) by mouth daily. 90 tablet 2  . hydrochlorothiazide (HYDRODIURIL) 12.5 MG tablet TAKE 1 TABLET (12.5 MG TOTAL) BY MOUTH DAILY. 90 tablet 0  . Multiple Vitamins-Minerals (WOMENS MULTIVITAMIN PLUS) TABS Take by mouth daily.    Marland Kitchen KLOR-CON M20 20 MEQ tablet TAKE 1 TABLET (20 MEQ TOTAL) BY MOUTH DAILY. (Patient not taking: Reported on 02/04/2017) 30 tablet 1   No current facility-administered medications on file prior to visit.     No Known Allergies  Family History  Problem Relation Age of Onset  . Diabetes Brother   . Ovarian cancer Mother   . Ovarian cancer Paternal Aunt   . Heart disease Maternal Grandmother   . Breast cancer Maternal Grandmother     Age 35's  . Breast cancer Maternal Aunt     Age 9's    Social History   Social History  . Marital status: Married    Spouse name: N/A  . Number of children: N/A  . Years of  education: N/A   Social History Main Topics  . Smoking status: Never Smoker  . Smokeless tobacco: Never Used  . Alcohol use No  . Drug use: No  . Sexual activity: Yes    Birth control/ protection: Surgical     Comment: tubal ligation   Other Topics Concern  . None   Social History Narrative  . None   Review of Systems - See HPI.  All other ROS are negative.  BP 128/88   Pulse 81   Temp 98.1 F (36.7 C) (Oral)   Resp 14   Ht 5\' 4"  (1.626 m)   Wt 215 lb (97.5 kg)   LMP 11/10/2011   SpO2 99%   BMI 36.90 kg/m   Physical Exam  Constitutional: She is oriented to person, place, and time and well-developed, well-nourished, and in no distress.  HENT:  Head: Normocephalic and atraumatic.  Eyes: Conjunctivae are normal.  Neck: Neck supple.  Cardiovascular: Normal rate, regular rhythm, normal heart sounds and intact distal pulses.   Pulmonary/Chest: Effort normal and breath sounds normal. No respiratory distress. She has no wheezes. She has no rales. She exhibits no tenderness.  Neurological: She is alert and oriented to person, place, and time.  Skin: Skin is warm and dry. No rash noted.  Psychiatric: Affect normal.  Vitals reviewed.  Assessment/Plan: Hyperlipidemia Taking statin as directed. Discussed dietary and exercise recommendations. Fasting lipids  today. Will alter based on results.  HTN (hypertension) BP controlled. Asymptomatic. No taking Klor-Con as directed. Will need repeat BMP today. Continue medication as directed. Will alter based on lab results.    Leeanne Rio, PA-C

## 2017-02-04 NOTE — Assessment & Plan Note (Signed)
BP controlled. Asymptomatic. No taking Klor-Con as directed. Will need repeat BMP today. Continue medication as directed. Will alter based on lab results.

## 2017-02-05 ENCOUNTER — Other Ambulatory Visit: Payer: Self-pay | Admitting: Physician Assistant

## 2017-02-05 MED ORDER — ATORVASTATIN CALCIUM 20 MG PO TABS
20.0000 mg | ORAL_TABLET | Freq: Every day | ORAL | 0 refills | Status: DC
Start: 2017-02-05 — End: 2017-05-03

## 2017-02-05 MED ORDER — HYDROCHLOROTHIAZIDE 12.5 MG PO TABS: 12.5000 mg | ORAL_TABLET | Freq: Every day | ORAL | 0 refills | Status: DC

## 2017-02-06 ENCOUNTER — Other Ambulatory Visit: Payer: Self-pay | Admitting: Physician Assistant

## 2017-02-06 ENCOUNTER — Encounter: Payer: Self-pay | Admitting: Emergency Medicine

## 2017-02-06 DIAGNOSIS — E785 Hyperlipidemia, unspecified: Secondary | ICD-10-CM

## 2017-03-13 ENCOUNTER — Encounter: Payer: Self-pay | Admitting: Gynecology

## 2017-04-03 ENCOUNTER — Other Ambulatory Visit: Payer: Self-pay | Admitting: Physician Assistant

## 2017-05-03 ENCOUNTER — Other Ambulatory Visit: Payer: Self-pay | Admitting: Physician Assistant

## 2017-05-20 ENCOUNTER — Encounter (HOSPITAL_BASED_OUTPATIENT_CLINIC_OR_DEPARTMENT_OTHER): Payer: Self-pay

## 2017-05-20 ENCOUNTER — Emergency Department (HOSPITAL_BASED_OUTPATIENT_CLINIC_OR_DEPARTMENT_OTHER)
Admission: EM | Admit: 2017-05-20 | Discharge: 2017-05-20 | Disposition: A | Discharge status: Home / Self Care | Payer: BLUE CROSS/BLUE SHIELD | Attending: Emergency Medicine | Admitting: Emergency Medicine

## 2017-05-20 DIAGNOSIS — Z79899 Other long term (current) drug therapy: Secondary | ICD-10-CM | POA: Insufficient documentation

## 2017-05-20 DIAGNOSIS — M5432 Sciatica, left side: Secondary | ICD-10-CM | POA: Insufficient documentation

## 2017-05-20 DIAGNOSIS — I10 Essential (primary) hypertension: Secondary | ICD-10-CM | POA: Insufficient documentation

## 2017-05-20 MED ORDER — DEXAMETHASONE SODIUM PHOSPHATE 10 MG/ML IJ SOLN
10.0000 mg | Freq: Once | INTRAMUSCULAR | Status: AC
  Administered 2017-05-20: 10 mg via INTRAMUSCULAR
  Filled 2017-05-20: qty 1

## 2017-05-20 MED ORDER — CYCLOBENZAPRINE HCL 10 MG PO TABS: 10.0000 mg | ORAL_TABLET | Freq: Two times a day (BID) | ORAL | 0 refills | Status: DC | PRN

## 2017-05-20 MED ORDER — TRAMADOL HCL 50 MG PO TABS: 50.0000 mg | ORAL_TABLET | Freq: Four times a day (QID) | ORAL | 0 refills | Status: DC | PRN

## 2017-05-20 NOTE — ED Provider Notes (Signed)
[facility] DEPT [facility] Provider Note   CSN: [id number] Arrival date & time: 05/20/17  1648  By signing my name below, I, Dora Sims, attest that this documentation has been prepared under the direction and in the presence of physician practitioner, Malvin Johns, MD. Electronically Signed: Dora Sims, Scribe. 05/20/2017. 5:15 PM.  History   Chief Complaint Chief Complaint  Patient presents with  . Back Pain   The history is provided by the patient. No language interpreter was used.    HPI Comments: Madeline Hawkins is a 51 y.o. female with PMHx including HTN and HLD who presents to the Emergency Department for evaluation of 4 weeks of persistent bilateral lower back pain.  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 The pain intermittently radiates posteriorly into the left knee. She also endorses some tingling from her left buttock region into the posterior left knee. There are no specific aggravating factors noted. She has tried ibuprofen 800mg , Tylenol, and an OTC analgesic cream without relief. She has no prior h/o back pain. Patient denies abdominal pain, saddle anesthesia, urinary/bowel incontinence, or any other associated symptoms.  Past Medical History:  Diagnosis Date  . Anxiety   . CIN I (cervical intraepithelial neoplasia I) 1989   cryo...also in 2008 .. had cryo   . GERD (gastroesophageal reflux disease)   . Hyperlipidemia   . Hypertension     Patient Active Problem List   Diagnosis Date Noted  . Ovarian cyst, left 08/22/2016  . GERD (gastroesophageal reflux disease) 02/01/2016  . Adjustment disorder with depressed mood 08/05/2015  . Hyperlipidemia 11/14/2012  . HTN (hypertension) 02/04/2012  . Anxiety 02/04/2012  . Anemia 11/13/2011    Past Surgical History:  Procedure Laterality Date  . ABDOMINAL HYSTERECTOMY  11/22/2011   Procedure: HYSTERECTOMY ABDOMINAL;  Surgeon: Terrance Mass, MD;  Location: [facility];  Service: Gynecology;  Laterality: N/A;  . colon polyps removed    . GYNECOLOGIC CRYOSURGERY  07/07/2007  . LAPAROSCOPIC LYSIS OF ADHESIONS  06/28/2015   Procedure: LAPAROSCOPIC LYSIS OF ABDOMINAL PELVIC ADHESIONS;  Surgeon: Terrance Mass, MD;  Location: [facility];  Service: Gynecology;;  . LAPAROSCOPIC UNILATERAL SALPINGO OOPHERECTOMY Right 06/28/2015   Procedure: LAPAROSCOPIC RIGHT SALPINGO OOPHORECTOMY;  Surgeon: Terrance Mass, MD;  Location: [facility];  Service: Gynecology;  Laterality: Right;  . LAPAROSCOPIC UNILATERAL SALPINGO OOPHERECTOMY Left 10/11/2016   Procedure: LAPAROSCOPIC UNILATERAL OOPHORECTOMY with pelvic washings;  Surgeon: Terrance Mass, MD;  Location: [facility];  Service: Gynecology;  Laterality: Left;  HARMONIC SCAPEL  . LAPAROSCOPY  11/22/2011   Procedure: LAPAROSCOPY DIAGNOSTIC;  Surgeon: Terrance Mass, MD;  Location: [facility];  Service: Gynecology;  Laterality: N/A;  . OVARIAN CYST REMOVAL  11/22/2011   Procedure: OVARIAN CYSTECTOMY;  Surgeon: Terrance Mass, MD;  Location: [facility];  Service: Gynecology;  Laterality: Left;  . SALPINGECTOMY     left for ectopic pregnancy  . TUBAL LIGATION     right tubal ligation    OB History    Gravida Para Term Preterm AB Living   5 4 4   1 4    SAB TAB Ectopic Multiple Live Births   0   1   4       Home Medications    Prior to Admission medications   Medication Sig Start Date End Date Taking? Authorizing Provider  atorvastatin (LIPITOR) 20 MG tablet TAKE 1 TABLET (20 MG TOTAL)  BY MOUTH DAILY. 05/03/17   Brunetta Jeans, PA-C  cyclobenzaprine (FLEXERIL) 10 MG tablet Take 1 tablet (10 mg total) by mouth 2 (two) times daily as needed for muscle spasms. 05/20/17   Malvin Johns, MD  estradiol (ESTRACE) 0.5 MG tablet Take 1 tablet (0.5 mg total) by mouth daily. 01/14/17   Terrance Mass, MD  hydrochlorothiazide (HYDRODIURIL) 12.5 MG tablet TAKE 1 TABLET BY MOUTH EVERY DAY 05/03/17   Raiford Noble C, PA-C  KLOR-CON M20 20 MEQ tablet TAKE 1 TABLET (20 MEQ TOTAL) BY MOUTH DAILY. 04/03/17   Brunetta Jeans, PA-C  Multiple Vitamins-Minerals (WOMENS MULTIVITAMIN PLUS) TABS Take by mouth daily.    [provider]  traMADol (ULTRAM) 50 MG tablet Take 1 tablet (50 mg total) by mouth every 6 (six) hours as needed. 05/20/17   Malvin Johns, MD    Family History Family History  Problem Relation Age of Onset  . Diabetes Brother   . Ovarian cancer Mother   . Ovarian cancer Paternal Aunt   . Heart disease Maternal Grandmother   . Breast cancer Maternal Grandmother        Age 44's  . Breast cancer Maternal Aunt        Age 40's    Social History Social History  Substance Use Topics  . Smoking status: Never Smoker  . Smokeless tobacco: Never Used  . Alcohol use No     Allergies   Patient has no known allergies.   Review of Systems Review of Systems  Constitutional: Negative for fever.  Gastrointestinal: Negative for abdominal pain, nausea and vomiting.       Negative for bowel incontinence.  Genitourinary:       Negative for urinary incontinence.  Musculoskeletal: Positive for back pain and myalgias. Negative for neck pain.  Skin: Negative for wound.  Neurological: Positive for numbness (tingling). Negative for weakness and headaches.       Negative for saddle anesthesia.   Physical Exam Updated Vital Signs BP (!) 151/95 (BP Location: Left Arm)   Pulse 85   Temp 97.8 F (36.6 C) (Oral)   Resp 18   Ht 5\' 3"  (1.6 m)   Wt 91.6 kg (202 lb)   LMP 11/10/2011   SpO2 98%   BMI 35.78 kg/m   Physical Exam  Constitutional: She is oriented to person, place, and time. She appears well-developed and well-nourished.  HENT:  Head: Normocephalic and atraumatic.  Neck: Normal range of motion. Neck supple.  Cardiovascular: Normal rate.   Pulses:      Dorsalis pedis pulses are 2+ on the right side, and 2+ on the left side.  Pulmonary/Chest: Effort normal.    Musculoskeletal: She exhibits tenderness.  Tenderness along the left lower lumbar paraspinal area. Positive straight leg raise on the left. Normal motor function and sensation in the feet. Pedal pulses intact.  Neurological: She is alert and oriented to person, place, and time.  Patellar reflexes symmetric bilaterally.  Skin: Skin is warm and dry.  Psychiatric: She has a normal mood and affect.   ED Treatments / Results  Labs (all labs ordered are listed, but only abnormal results are displayed) Labs Reviewed - No data to display  EKG  EKG Interpretation None       Radiology No results found.  Procedures Procedures (including critical care time)  DIAGNOSTIC STUDIES: Oxygen Saturation is 98% on RA, normal by my interpretation.    COORDINATION OF CARE: 5:11 PM Discussed treatment plan with  pt at bedside and pt agreed to plan.  Medications Ordered in ED Medications  dexamethasone (DECADRON) injection 10 mg (not administered)     Initial Impression / Assessment and Plan / ED Course  I have reviewed the triage vital signs and the nursing notes.  Pertinent labs & imaging results that were available during my care of the patient were reviewed by me and considered in my medical decision making (see chart for details).     Patient presents with radicular left lower back pain. She denies any recent trauma. She's neurologically intact with no suggestions of cauda equina. She was given a shot of Decadron. She was given prescriptions for Flexeril and tramadol. She was encouraged to have follow-up with her PCP. If her symptoms are not improving, she may need an MRI which I advised her to be scheduled by her PCP. Return precautions were given.  Final Clinical Impressions(s) / ED Diagnoses   Final diagnoses:  Sciatica of left side    New Prescriptions New Prescriptions   CYCLOBENZAPRINE (FLEXERIL) 10 MG TABLET    Take 1 tablet (10 mg total) by mouth 2 (two) times daily as  needed for muscle spasms.   TRAMADOL (ULTRAM) 50 MG TABLET    Take 1 tablet (50 mg total) by mouth every 6 (six) hours as needed.   I personally performed the services described in this documentation, which was scribed in my presence.  The recorded information has been reviewed and considered.    Malvin Johns, MD 05/20/17 [phone]

## 2017-05-20 NOTE — ED Triage Notes (Addendum)
C/o lower back pain x 4 weeks-denies injury-NAD-slow steady gait

## 2017-06-04 ENCOUNTER — Telehealth: Payer: Self-pay | Admitting: Physician Assistant

## 2017-06-04 NOTE — Telephone Encounter (Signed)
Will need to check liver function in order to continue medication.

## 2017-06-04 NOTE — Telephone Encounter (Signed)
Patient returned call stating that needs to have labs drawn. Patient does not have insurance so she is currently unable to get bloodwork.

## 2017-06-04 NOTE — Telephone Encounter (Signed)
Please advise 

## 2017-06-05 ENCOUNTER — Other Ambulatory Visit: Payer: Self-pay | Admitting: Physician Assistant

## 2017-06-06 NOTE — Telephone Encounter (Signed)
Patient has pending appt for lab visit to recheck liver function on statin medication (Atorvastatin)

## 2017-06-06 NOTE — Telephone Encounter (Signed)
Spoke with patient and advised that since she did not have insurance at this time. The lab visit for hepatic function panel would be $16.65. Patient is agreeable. Lab visit scheduled and lab orders placed.

## 2017-06-07 ENCOUNTER — Other Ambulatory Visit: Payer: Self-pay | Admitting: Physician Assistant

## 2017-06-07 ENCOUNTER — Other Ambulatory Visit (INDEPENDENT_AMBULATORY_CARE_PROVIDER_SITE_OTHER): Payer: Self-pay

## 2017-06-07 DIAGNOSIS — E785 Hyperlipidemia, unspecified: Secondary | ICD-10-CM

## 2017-06-07 LAB — HEPATIC FUNCTION PANEL
ALBUMIN: 4.4 g/dL (ref 3.5–5.2)
ALT: 17 U/L (ref 0–35)
AST: 12 U/L (ref 0–37)
Alkaline Phosphatase: 77 U/L (ref 39–117)
Bilirubin, Direct: 0.1 mg/dL (ref 0.0–0.3)
Total Bilirubin: 0.7 mg/dL (ref 0.2–1.2)
Total Protein: 7 g/dL (ref 6.0–8.3)

## 2017-08-12 ENCOUNTER — Other Ambulatory Visit: Payer: Self-pay | Admitting: Emergency Medicine

## 2017-08-12 MED ORDER — ATORVASTATIN CALCIUM 20 MG PO TABS: 20.0000 mg | ORAL_TABLET | Freq: Every day | ORAL | 1 refills | Status: DC

## 2017-08-29 IMAGING — CT CT ABD-PELV W/ CM
2 of 5 series · 15 of 46 positions shown, 17 images · IV contrast (iopamidol)
Comparison: 11/08/2011

CLINICAL DATA: Right lower quadrant pain, history of ovarian cysts,
possible appendicitis

EXAM:
CT ABDOMEN WITHOUT AND WITH CONTRAST
CT PELVIS WITHOUT CONTRAST
TECHNIQUE: Multidetector CT imaging of the abdomen was performed initially
following the standard protocol before administration of intravenous
contrast. Multidetector CT imaging of the abdomen was then performed
following the standard protocol during the bolus injection of
intravenous contrast. Multidetector CT imaging of the pelvis was
performed following the standard protocol without intravenous
contrast.
CONTRAST:  100mL 0GGR1H-UQQ IOPAMIDOL (0GGR1H-UQQ) INJECTION 61%

[Series 2: abd/pel with · axial · 0.71mm/px · z∈[+1271,+1656]mm · 12 of 87 slices shown, 14 images]
[im 5/87  soft-tissue]
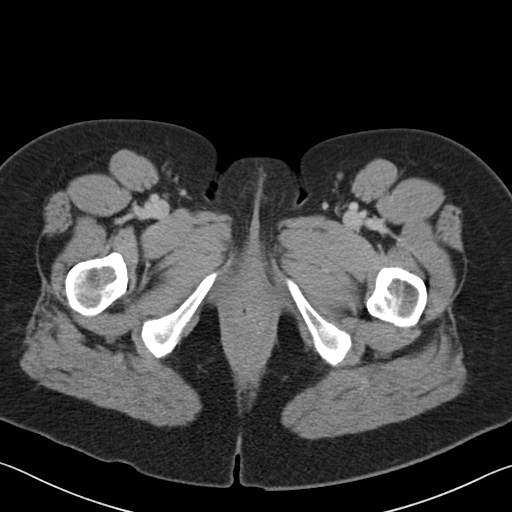
[im 5/87  bone]
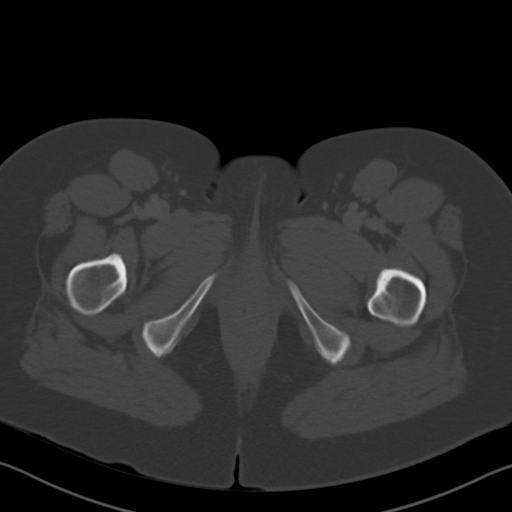
[im 13/87  soft-tissue]
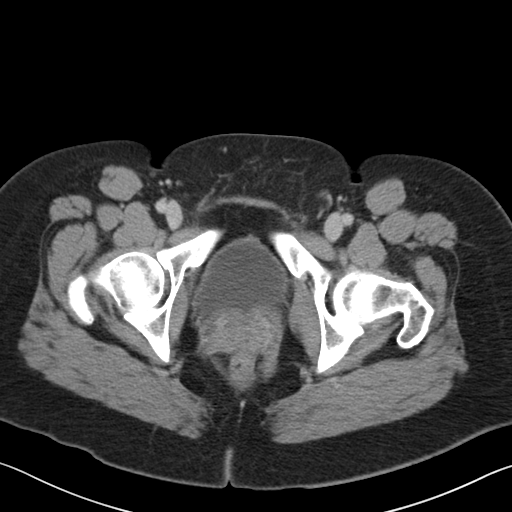
[im 21/87  soft-tissue]
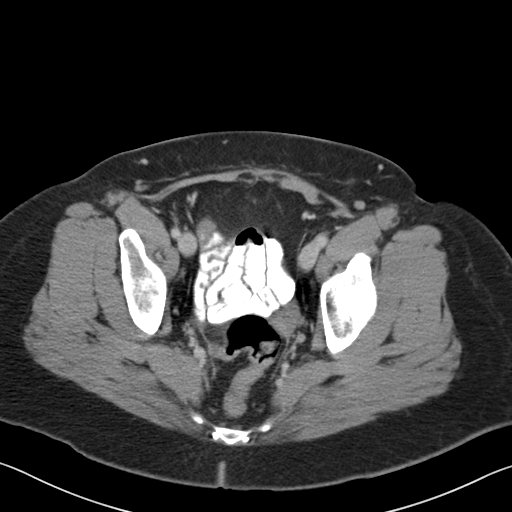
[im 25/87  soft-tissue]
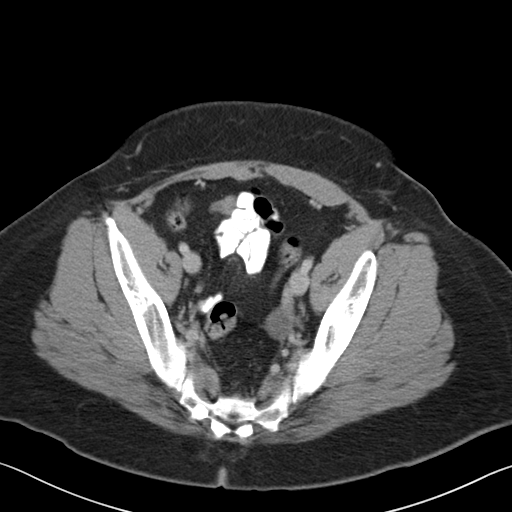
[im 33/87  soft-tissue]
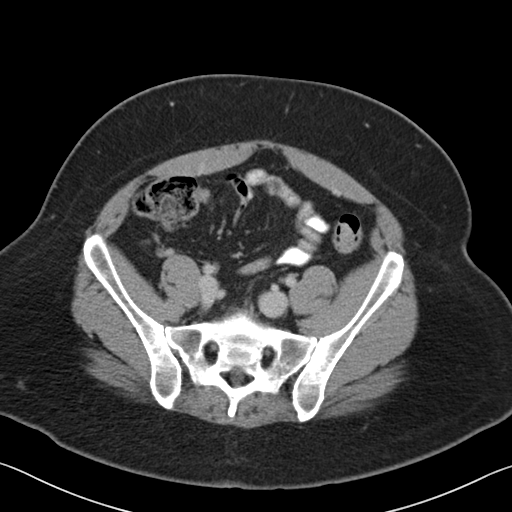
[im 41/87  soft-tissue]
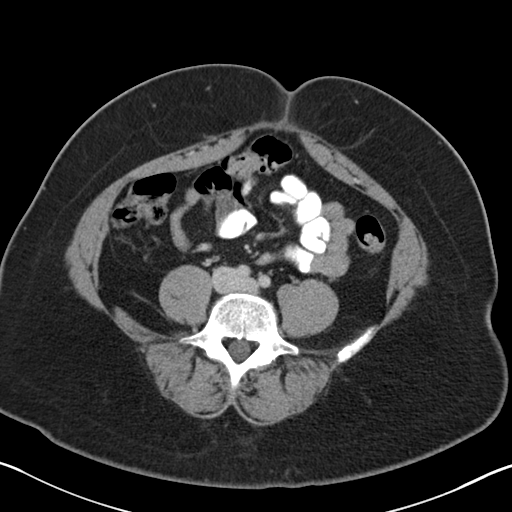
[im 46/87  soft-tissue]
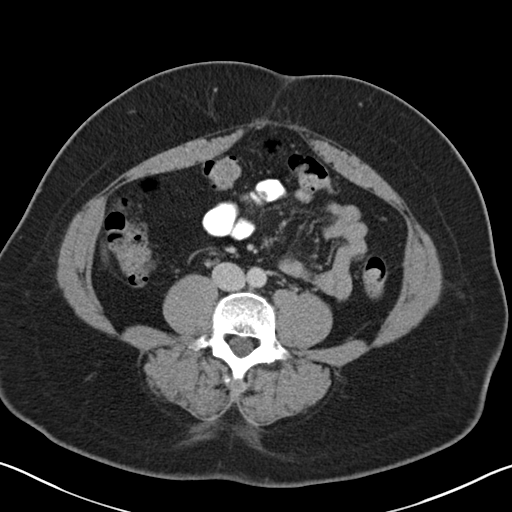
[im 54/87  soft-tissue]
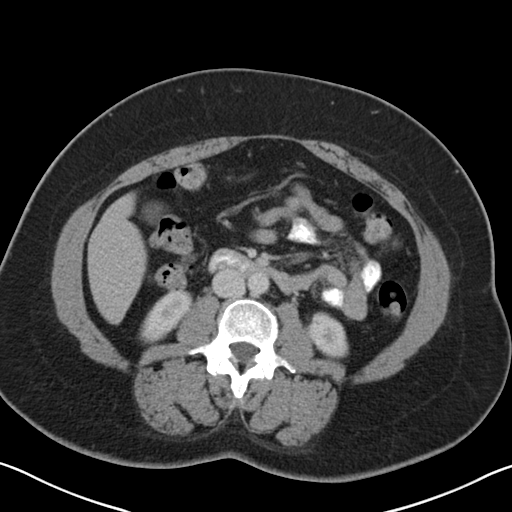
[im 62/87  soft-tissue]
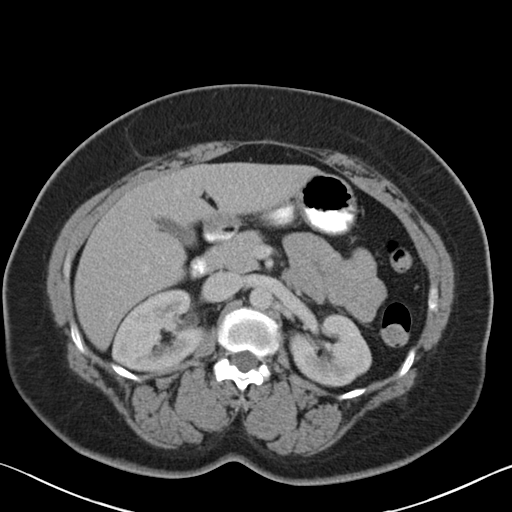
[im 62/87  bone]
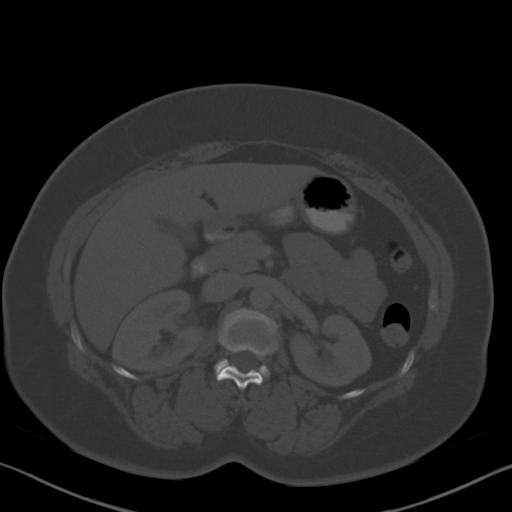
[im 66/87  soft-tissue]
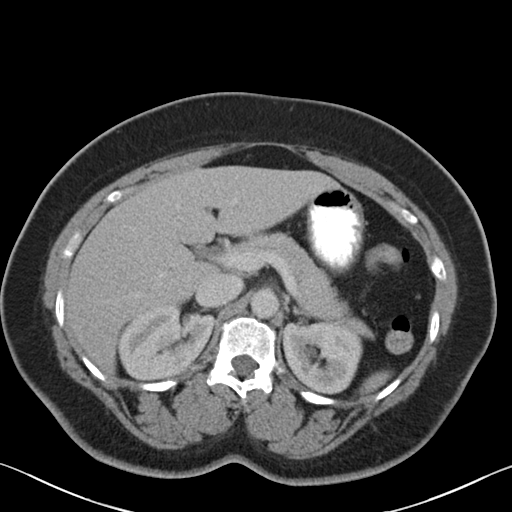
[im 74/87  soft-tissue]
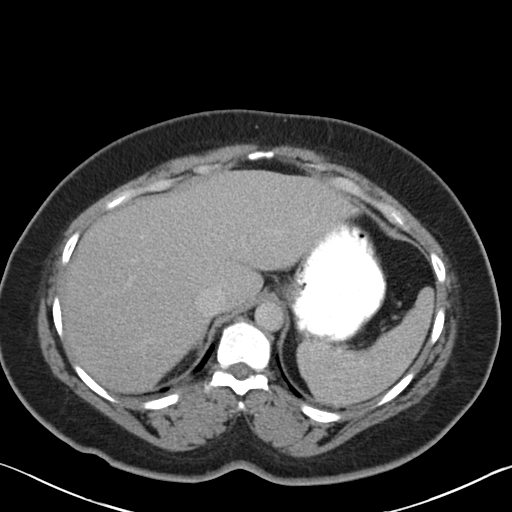
[im 82/87  soft-tissue]
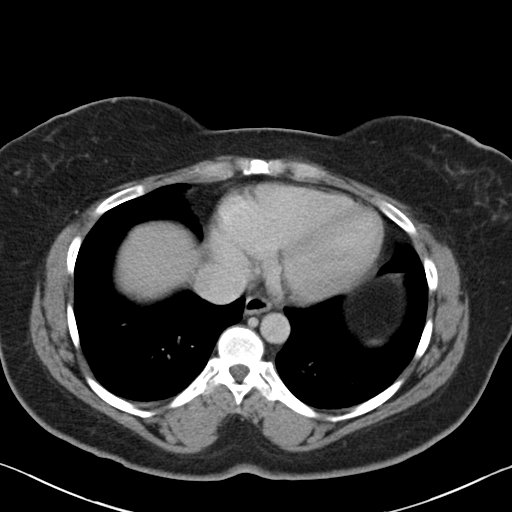

[Series 3: coronal a/|p · coronal · 0.92mm/px · 3 of 85 slices shown]
[im 29/85  soft-tissue]
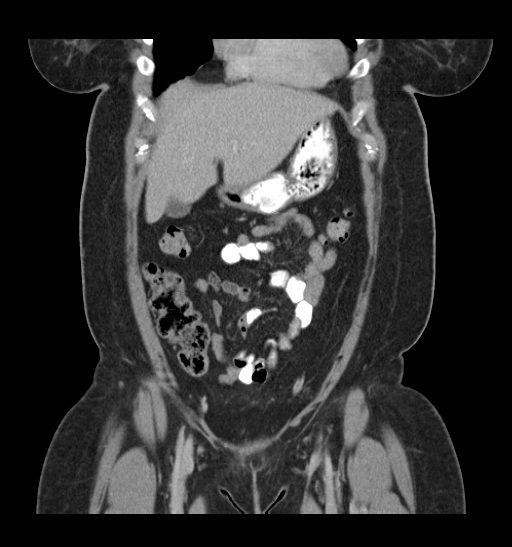
[im 38/85  soft-tissue]
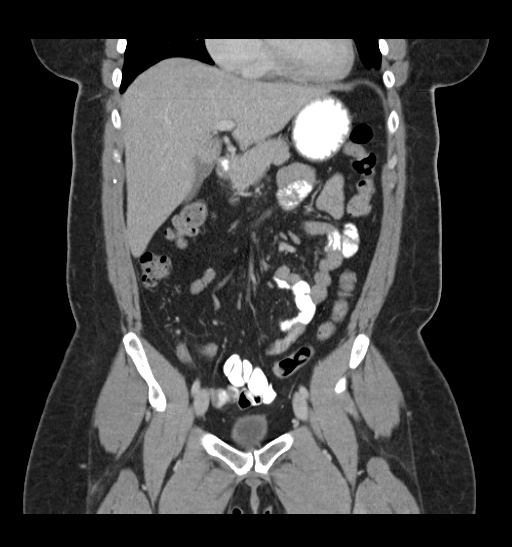
[im 47/85  soft-tissue]
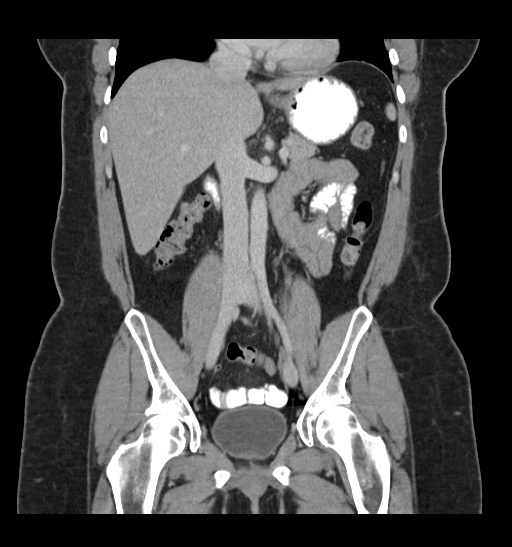

[15 of 46 positions shown; findings below may reference images not displayed]

FINDINGS: Lower chest: Lung bases are unremarkable. Heart size within normal
limits. No pericardial effusion.

Hepatobiliary: Enhanced liver shows no focal mass. No calcified
gallstones are noted within gallbladder. No intrahepatic biliary
ductal dilatation. No CBD dilatation.

Pancreas: Enhanced pancreas shows no focal mass or peripancreatic
inflammation.

Spleen:  Unremarkable

Adrenals/Urinary Tract: No adrenal gland mass is noted. Enhanced
kidneys are symmetrical in size. No hydronephrosis or hydroureter.

Delayed renal images shows bilateral renal symmetrical excretion.
Bilateral visualized proximal ureter is unremarkable.

Stomach/Bowel: There is no gastric outlet obstruction. No small
bowel obstruction. No thickened or dilated small bowel loops. Some
colonic stool noted in right colon and transverse colon. Normal
appendix is noted in axial image 57. No pericecal inflammation. The
terminal ileum is unremarkable.

No distal colonic obstruction. Some colonic stool and gas noted in
left colon and proximal sigmoid colon. No colitis or diverticulitis.

Vascular/Lymphatic: No aortic aneurysm. No retroperitoneal or
mesenteric adenopathy.

Reproductive: The uterus is surgically absent. The right ovary is
not identified. There is a left ovarian cyst measures 2.6 cm. No
pelvic free fluid. The urinary bladder is unremarkable.

Other: Again noted a umbilical and supraumbilical hernia containing
omental fat measures 2.2 by 2.3 cm without evidence of acute
complication. This is best seen in sagittal image 58.

Musculoskeletal: Sagittal images of the spine shows mild
degenerative changes lower thoracic spine. No destructive bony
lesions are noted. No destructive bony lesions are noted within
pelvis. Mild degenerative changes pubic symphysis.

Bilateral hip joints are symmetrical in appearance on coronal views.
IMPRESSION: 1. There is no evidence of acute inflammatory process within
abdomen.
2. Normal appendix.  No pericecal inflammation.
3. Again noted umbilical and supraumbilical component small hernia
containing fat without evidence of acute complication. Measures
x 2.3 cm.
4. No hydronephrosis or hydroureter. Bilateral renal symmetrical
excretion.
5. Surgically absent uterus. The right ovary is not identified.
There is a simple cyst within left ovary measures 2.6 cm. No pelvic
free fluid.
6. Minimal degenerative changes lower thoracic spine.

## 2017-10-01 ENCOUNTER — Encounter: Payer: Self-pay | Admitting: Physician Assistant

## 2017-10-01 ENCOUNTER — Ambulatory Visit (INDEPENDENT_AMBULATORY_CARE_PROVIDER_SITE_OTHER): Payer: PRIVATE HEALTH INSURANCE | Admitting: Physician Assistant

## 2017-10-01 VITALS — BP 122/86 | HR 84 | Temp 97.6°F | Resp 14 | Ht 63.0 in | Wt 205.0 lb

## 2017-10-01 DIAGNOSIS — H6502 Acute serous otitis media, left ear: Secondary | ICD-10-CM | POA: Diagnosis not present

## 2017-10-01 DIAGNOSIS — J019 Acute sinusitis, unspecified: Secondary | ICD-10-CM | POA: Diagnosis not present

## 2017-10-01 DIAGNOSIS — B9689 Other specified bacterial agents as the cause of diseases classified elsewhere: Secondary | ICD-10-CM

## 2017-10-01 MED ORDER — MECLIZINE HCL 25 MG PO TABS: 25.0000 mg | ORAL_TABLET | Freq: Three times a day (TID) | ORAL | 0 refills | Status: DC | PRN

## 2017-10-01 MED ORDER — FLUTICASONE PROPIONATE 50 MCG/ACT NA SUSP: 2.0000 | Freq: Every day | NASAL | 1 refills | Status: DC

## 2017-10-01 MED ORDER — AMOXICILLIN-POT CLAVULANATE 875-125 MG PO TABS: 1.0000 | ORAL_TABLET | Freq: Two times a day (BID) | ORAL | 0 refills | Status: DC

## 2017-10-01 NOTE — Progress Notes (Signed)
Patient presents to clinic today c/o 3 days of sinus pain, L ear pain and dizziness following 1 week of sore throat, hoarseness, sinus pressure and ear pressure. Denies drainage from ear. Notes occasional dizziness with positional change. Denies chest congestion, chest pain, racing heart or SOB. There is pressure in R ear without pain. Patient denies recent travel or sick contact. Denies taking anything for her symptoms.   Past Medical History:  Diagnosis Date  . Anxiety   . CIN I (cervical intraepithelial neoplasia I) 1989   cryo...also in 2008 .. had cryo   . GERD (gastroesophageal reflux disease)   . Hyperlipidemia   . Hypertension     Current Outpatient Medications on File Prior to Visit  Medication Sig Dispense Refill  . atorvastatin (LIPITOR) 20 MG tablet Take 1 tablet (20 mg total) by mouth daily. 90 tablet 1  . estradiol (ESTRACE) 0.5 MG tablet Take 1 tablet (0.5 mg total) by mouth daily. 90 tablet 2  . hydrochlorothiazide (HYDRODIURIL) 12.5 MG tablet TAKE 1 TABLET BY MOUTH EVERY DAY 90 tablet 1  . KLOR-CON M20 20 MEQ tablet TAKE 1 TABLET (20 MEQ TOTAL) BY MOUTH DAILY. 90 tablet 0  . Multiple Vitamins-Minerals (WOMENS MULTIVITAMIN PLUS) TABS Take by mouth daily.     No current facility-administered medications on file prior to visit.     No Known Allergies  Family History  Problem Relation Age of Onset  . Diabetes Brother   . Ovarian cancer Mother   . Ovarian cancer Paternal Aunt   . Heart disease Maternal Grandmother   . Breast cancer Maternal Grandmother        Age 74's  . Breast cancer Maternal Aunt        Age 50's    Social History   Socioeconomic History  . Marital status: Married    Spouse name: None  . Number of children: None  . Years of education: None  . Highest education level: None  Social Needs  . Financial resource strain: None  . Food insecurity - worry: None  . Food insecurity - inability: None  . Transportation needs - medical: None  .  Transportation needs - non-medical: None  Occupational History  . None  Tobacco Use  . Smoking status: Never Smoker  . Smokeless tobacco: Never Used  Substance and Sexual Activity  . Alcohol use: No    Alcohol/week: 0.0 oz  . Drug use: No  . Sexual activity: Yes    Birth control/protection: Surgical  Other Topics Concern  . None  Social History Narrative  . None   Review of Systems - See HPI.  All other ROS are negative.  BP 122/86   Pulse 84   Temp 97.6 F (36.4 C) (Oral)   Resp 14   Ht 5\' 3"  (1.6 m)   Wt 205 lb (93 kg)   LMP 11/10/2011   SpO2 98%   BMI 36.31 kg/m   Physical Exam  Constitutional: She is oriented to person, place, and time and well-developed, well-nourished, and in no distress.  HENT:  Head: Normocephalic and atraumatic.  Right Ear: A middle ear effusion (serous) is present.  Left Ear: A middle ear effusion (serous) is present.  Nose: Mucosal edema and rhinorrhea present.  Mouth/Throat: Uvula is midline, oropharynx is clear and moist and mucous membranes are normal.  + TTP sinuses  Eyes: Conjunctivae are normal.  Neck: Neck supple.  Cardiovascular: Normal rate, regular rhythm, normal heart sounds and intact distal pulses.  Lymphadenopathy:    She has no cervical adenopathy.  Neurological: She is alert and oriented to person, place, and time.  Skin: Skin is warm and dry. No rash noted.  Psychiatric: Affect normal.  Vitals reviewed.  Assessment/Plan: 1. Acute serous otitis media of left ear, recurrence not specified Start Flonase. Meclizine for dizziness. Supportive measures and OTC medications reviewed with patient. Follow-up if not improving. - fluticasone (FLONASE) 50 MCG/ACT nasal spray; Place 2 sprays into both nostrils daily.  Dispense: 16 g; Refill: 1 - meclizine (ANTIVERT) 25 MG tablet; Take 1 tablet (25 mg total) by mouth 3 (three) times daily as needed for dizziness.  Dispense: 30 tablet; Refill: 0  2. Acute bacterial sinusitis Rx  Augmentin.  Increase fluids.  Rest.  Saline nasal spray.  Probiotic.  Mucinex as directed.  Humidifier in bedroom.  Call or return to clinic if symptoms are not improving.  - amoxicillin-clavulanate (AUGMENTIN) 875-125 MG tablet; Take 1 tablet by mouth 2 (two) times daily.  Dispense: 14 tablet; Refill: 0 - fluticasone (FLONASE) 50 MCG/ACT nasal spray; Place 2 sprays into both nostrils daily.  Dispense: 16 g; Refill: [place], PA-C

## 2017-10-01 NOTE — Patient Instructions (Signed)
Please take antibiotic as directed.  Increase fluid intake.  Use Saline nasal spray.  Take a daily multivitamin. Start the Flonase once daily as directed.  Place a humidifier in the bedroom.    The Meclizine is to help with the dizziness until we get the fluid off of your ears and infection treated.  Please call or return clinic if symptoms are not improving.  Sinusitis Sinusitis is redness, soreness, and swelling (inflammation) of the paranasal sinuses. Paranasal sinuses are air pockets within the bones of your face (beneath the eyes, the middle of the forehead, or above the eyes). In healthy paranasal sinuses, mucus is able to drain out, and air is able to circulate through them by way of your nose. However, when your paranasal sinuses are inflamed, mucus and air can become trapped. This can allow bacteria and other germs to grow and cause infection. Sinusitis can develop quickly and last only a short time (acute) or continue over a long period (chronic). Sinusitis that lasts for more than 12 weeks is considered chronic.  CAUSES  Causes of sinusitis include:  Allergies.  Structural abnormalities, such as displacement of the cartilage that separates your nostrils (deviated septum), which can decrease the air flow through your nose and sinuses and affect sinus drainage.  Functional abnormalities, such as when the small hairs (cilia) that line your sinuses and help remove mucus do not work properly or are not present. SYMPTOMS  Symptoms of acute and chronic sinusitis are the same. The primary symptoms are pain and pressure around the affected sinuses. Other symptoms include:  Upper toothache.  Earache.  Headache.  Bad breath.  Decreased sense of smell and taste.  A cough, which worsens when you are lying flat.  Fatigue.  Fever.  Thick drainage from your nose, which often is green and may contain pus (purulent).  Swelling and warmth over the affected sinuses. DIAGNOSIS  Your  caregiver will perform a physical exam. During the exam, your caregiver may:  Look in your nose for signs of abnormal growths in your nostrils (nasal polyps).  Tap over the affected sinus to check for signs of infection.  View the inside of your sinuses (endoscopy) with a special imaging device with a light attached (endoscope), which is inserted into your sinuses. If your caregiver suspects that you have chronic sinusitis, one or more of the following tests may be recommended:  Allergy tests.  Nasal culture A sample of mucus is taken from your nose and sent to a lab and screened for bacteria.  Nasal cytology A sample of mucus is taken from your nose and examined by your caregiver to determine if your sinusitis is related to an allergy. TREATMENT  Most cases of acute sinusitis are related to a viral infection and will resolve on their own within 10 days. Sometimes medicines are prescribed to help relieve symptoms (pain medicine, decongestants, nasal steroid sprays, or saline sprays).  However, for sinusitis related to a bacterial infection, your caregiver will prescribe antibiotic medicines. These are medicines that will help kill the bacteria causing the infection.  Rarely, sinusitis is caused by a fungal infection. In theses cases, your caregiver will prescribe antifungal medicine. For some cases of chronic sinusitis, surgery is needed. Generally, these are cases in which sinusitis recurs more than 3 times per year, despite other treatments. HOME CARE INSTRUCTIONS   Drink plenty of water. Water helps thin the mucus so your sinuses can drain more easily.  Use a humidifier.  Inhale steam 3  to 4 times a day (for example, sit in the bathroom with the shower running).  Apply a warm, moist washcloth to your face 3 to 4 times a day, or as directed by your caregiver.  Use saline nasal sprays to help moisten and clean your sinuses.  Take over-the-counter or prescription medicines for pain,  discomfort, or fever only as directed by your caregiver. SEEK IMMEDIATE MEDICAL CARE IF:  You have increasing pain or severe headaches.  You have nausea, vomiting, or drowsiness.  You have swelling around your face.  You have vision problems.  You have a stiff neck.  You have difficulty breathing. MAKE SURE YOU:   Understand these instructions.  Will watch your condition.  Will get help right away if you are not doing well or get worse. Document Released: 10/15/2005 Document Revised: 01/07/2012 Document Reviewed: 10/30/2011 [facility] Patient Information 2014 [place], [place].

## 2017-10-15 ENCOUNTER — Other Ambulatory Visit: Payer: Self-pay

## 2017-11-11 ENCOUNTER — Other Ambulatory Visit: Payer: Self-pay | Admitting: Physician Assistant

## 2017-11-11 ENCOUNTER — Telehealth: Payer: Self-pay | Admitting: Physician Assistant

## 2017-11-11 NOTE — Telephone Encounter (Signed)
Copied from [place] [id number]. Topic: Quick Communication - Rx Refill/Question >> Nov 11, 2017  2:00 PM Lolita Rieger, [place] wrote: Medication: HCTZ 12.5mg    Has the patient contacted their pharmacy? yes  (Agent: If no, request that the patient contact the pharmacy for the refill.)   Preferred Pharmacy (with phone number or street name): [facility] on [address]   Agent: Please be advised that RX refills may take up to 3 business days. We ask that you follow-up with your pharmacy.

## 2017-11-12 NOTE — Telephone Encounter (Signed)
Medication refilled to the pharmacy. Will need to schedule a BP follow or CPE.

## 2017-11-14 NOTE — Telephone Encounter (Signed)
Advised patient she is due for a CPE. She is agreeable

## 2017-11-28 ENCOUNTER — Encounter: Payer: Self-pay | Admitting: Obstetrics & Gynecology

## 2017-11-28 ENCOUNTER — Ambulatory Visit (INDEPENDENT_AMBULATORY_CARE_PROVIDER_SITE_OTHER): Payer: PRIVATE HEALTH INSURANCE | Admitting: Obstetrics & Gynecology

## 2017-11-28 VITALS — BP 132/86 | Ht 63.0 in | Wt 205.0 lb

## 2017-11-28 DIAGNOSIS — N951 Menopausal and female climacteric states: Secondary | ICD-10-CM

## 2017-11-28 DIAGNOSIS — Z79890 Hormone replacement therapy (postmenopausal): Secondary | ICD-10-CM

## 2017-11-28 DIAGNOSIS — Z13820 Encounter for screening for osteoporosis: Secondary | ICD-10-CM | POA: Diagnosis not present

## 2017-11-28 DIAGNOSIS — Z90710 Acquired absence of both cervix and uterus: Secondary | ICD-10-CM | POA: Diagnosis not present

## 2017-11-28 DIAGNOSIS — R1031 Right lower quadrant pain: Secondary | ICD-10-CM

## 2017-11-28 DIAGNOSIS — Z90722 Acquired absence of ovaries, bilateral: Secondary | ICD-10-CM | POA: Diagnosis not present

## 2017-11-28 DIAGNOSIS — Z9079 Acquired absence of other genital organ(s): Secondary | ICD-10-CM

## 2017-11-28 DIAGNOSIS — Z01411 Encounter for gynecological examination (general) (routine) with abnormal findings: Secondary | ICD-10-CM

## 2017-11-28 MED ORDER — ESTRADIOL 0.1 MG/24HR TD PTWK: 0.1000 mg | MEDICATED_PATCH | TRANSDERMAL | 4 refills | Status: DC

## 2017-11-28 NOTE — Patient Instructions (Signed)
1. Encounter for gynecological examination with abnormal finding Gynecologic exam status post total abdominal hysterectomy and bilateral salpingo-oophorectomy.  Pap reflex done on vaginal vault.  Breast exam normal.  Will schedule screening mammogram.  Health labs with family physician.  Will organize next screening colonoscopy through family physician.  2. S/P TAH-BSO  3. RLQ abdominal pain No adnexal mass felt on gynecologic exam.  Tender towards the left pelvic wall.  Will evaluate by pelvic ultrasound for any pelvic cyst or mass.  If pelvic ultrasound is negative, patient will follow up with family physician for further investigation. - US Transvaginal Non-OB; Future  4. Post-menopause on HRT (hormone replacement therapy) Very symptomatic with severe vasomotor symptoms even on estradiol 1 mg daily.  Status post hysterectomy.  Will try estradiol patch 0.1 weekly no contraindication to hormone replacement therapy.  Usage, risks and benefits reviewed with patient.  Prescription sent to pharmacy.  5. Screening for osteoporosis Will do a vitamin D level with her family physician.  Vitamin D supplements recommended, calcium rich nutrition and regular weightbearing physical activity.  Follow-up here for bone density. - DG Bone Density; Future  Other orders - estradiol (CLIMARA - DOSED IN MG/24 HR) 0.1 mg/24hr patch; Place 1 patch (0.1 mg total) onto the skin once a week.  Georgian, it was a pleasure meeting you today!  I will inform you of your results as soon as they are available.   Menopause and Hormone Replacement Therapy What is hormone replacement therapy? Hormone replacement therapy (HRT) is the use of artificial (synthetic) hormones to replace hormones that your body stops producing during menopause. Menopause is the normal time of life when menstrual periods stop completely and the ovaries stop producing the female hormones estrogen and progesterone. This lack of hormones can affect your  health and cause undesirable symptoms. HRT can relieve some of those symptoms. What are my options for HRT? HRT may consist of the synthetic hormones estrogen and progestin, or it may consist of only estrogen (estrogen-only therapy). You and your health care provider will decide which form of HRT is best for you. If you choose to be on HRT and you have a uterus, estrogen and progestin are usually prescribed. Estrogen-only therapy is used for women who do not have a uterus. Possible options for taking HRT include:  Pills.  Patches.  Gels.  Sprays.  Vaginal cream.  Vaginal rings.  Vaginal inserts.  The amount of hormone(s) that you take and how long you take the hormone(s) varies depending on your individual health. It is important to:  Begin HRT with the lowest possible dosage.  Stop HRT as soon as your health care provider tells you to stop.  Work with your health care provider so that you feel informed and comfortable with your decisions.  What are the benefits of HRT? HRT can reduce the frequency and severity of menopausal symptoms. Benefits of HRT vary depending on the menopausal symptoms that you have, the severity of your symptoms, and your overall health. HRT may help to improve the following menopausal symptoms:  Hot flashes and night sweats. These are sudden feelings of heat that spread over the face and body. The skin may turn red, like a blush. Night sweats are hot flashes that happen while you are sleeping or trying to sleep.  Bone loss (osteoporosis). The body loses calcium more quickly after menopause, causing the bones to become weaker. This can increase the risk for bone breaks (fractures).  Vaginal dryness. The lining of the  vagina can become thin and dry, which can cause pain during sexual intercourse or cause infection, burning, or itching.  Urinary tract infections.  Urinary incontinence. This is a decreased ability to control when you  urinate.  Irritability.  Short-term memory problems.  What are the risks of HRT? Risks of HRT vary depending on your individual health and medical history. Risks of HRT also depend on whether you receive both estrogen and progestin or you receive estrogen only.HRT may increase the risk of:  Spotting. This is when a small amount of bloodleaks from the vagina unexpectedly.  Endometrial cancer. This cancer is in the lining of the uterus (endometrium).  Breast cancer.  Increased density of breast tissue. This can make it harder to find breast cancer on a breast X-ray (mammogram).  Stroke.  Heart attack.  Blood clots.  Gallbladder disease.  Risks of HRT can increase if you have any of the following conditions:  Endometrial cancer.  Liver disease.  Heart disease.  Breast cancer.  History of blood clots.  History of stroke.  How should I care for myself while I am on HRT?  Take over-the-counter and prescription medicines only as told by your health care provider.  Get mammograms, pelvic exams, and medical checkups as often as told by your health care provider.  Have Pap tests done as often as told by your health care provider. A Pap test is sometimes called a Pap smear. It is a screening test that is used to check for signs of cancer of the cervix and vagina. A Pap test can also identify the presence of infection or precancerous changes. Pap tests may be done: ? Every 3 years, starting at age 53. ? Every 5 years, starting after age 7, in combination with testing for human papillomavirus (HPV). ? More often or less often depending on other medical conditions you have, your age, and other risk factors.  It is your responsibility to get your Pap test results. Ask your health care provider or the department performing the test when your results will be ready.  Keep all follow-up visits as told by your health care provider. This is important. When should I seek medical  care? Talk with your health care provider if:  You have any of these: ? Pain or swelling in your legs. ? Shortness of breath. ? Chest pain. ? Lumps or changes in your breasts or armpits. ? Slurred speech. ? Pain, burning, or bleeding when you urine.  You develop any of these: ? Unusual vaginal bleeding. ? Dizziness or headaches. ? Weakness or numbness in any part of your arms or legs. ? Pain in your abdomen.  This information is not intended to replace advice given to you by your health care provider. Make sure you discuss any questions you have with your health care provider. Document Released: 07/14/2003 Document Revised: 09/11/2016 Document Reviewed: 04/18/2015 Elsevier Interactive Patient Education  2017 [employer].

## 2017-11-28 NOTE — Addendum Note (Signed)
Addended by: Thurnell Garbe A on: 11/28/2017 03:25 PM   Modules accepted: Orders

## 2017-11-28 NOTE — Progress Notes (Signed)
Madeline Hawkins 06/14/1966 [id number]   History:    52 y.o. [id number] Married.  Has 5 grand-children.  RP:  Established patient presenting for annual gyn exam   HPI: S/P TAH/BSO.  Had laparoscopic left oophorectomy December 2017 because of left pelvic pain and ovarian cysts.  Pathology was benign.  Menopause on Estradiol 1 mg per mouth daily.  Ran out of estradiol tablet a month ago.  Severe hot flushes/night sweats everyday/night while on estradiol tablets and even more so after running out.  C/O Right pelvic pain, worse with IC and for 20-30 minutes after.  No increase in pain from walking, but the pain never completely goes away.  Body mass index 36.31.  Urine and bowel movements normal.  Breasts normal.  Health labs with family physician.  Past medical history,surgical history, family history and social history were all reviewed and documented in the EPIC chart.  Gynecologic History Patient's last menstrual period was 11/10/2011. Contraception: status post hysterectomy Last Pap: 2013. Results were: negative Last mammogram: 05/2016. Results were: Negative Bone Density: Never Colonoscopy: Every 5 years, will organize with Fam MD  Obstetric History OB History  Gravida Para Term Preterm AB Living  5 4 4   1 4   SAB TAB Ectopic Multiple Live Births  0   1   4    # Outcome Date GA Lbr Len/2nd Weight Sex Delivery Anes PTL Lv  5 Ectopic           4 Term     F Vag-Spont  N LIV  3 Term     M Vag-Spont  N LIV  2 Term     F Vag-Spont  N LIV  1 Term     F Vag-Spont  N LIV       ROS: A ROS was performed and pertinent positives and negatives are included in the history.  GENERAL: No fevers or chills. HEENT: No change in vision, no earache, sore throat or sinus congestion. NECK: No pain or stiffness. CARDIOVASCULAR: No chest pain or pressure. No palpitations. PULMONARY: No shortness of breath, cough or wheeze. GASTROINTESTINAL: No abdominal pain, nausea, vomiting or diarrhea, melena or  bright red blood per rectum. GENITOURINARY: No urinary frequency, urgency, hesitancy or dysuria. MUSCULOSKELETAL: No joint or muscle pain, no back pain, no recent trauma. DERMATOLOGIC: No rash, no itching, no lesions. ENDOCRINE: No polyuria, polydipsia, no heat or cold intolerance. No recent change in weight. HEMATOLOGICAL: No anemia or easy bruising or bleeding. NEUROLOGIC: No headache, seizures, numbness, tingling or weakness. PSYCHIATRIC: No depression, no loss of interest in normal activity or change in sleep pattern.     Exam:   BP 132/86   Ht 5\' 3"  (1.6 m)   Wt 205 lb (93 kg)   LMP 11/10/2011   BMI 36.31 kg/m   Body mass index is 36.31 kg/m.  General appearance : Well developed well nourished female. No acute distress HEENT: Eyes: no retinal hemorrhage or exudates,  Neck supple, trachea midline, no carotid bruits, no thyroidmegaly Lungs: Clear to auscultation, no rhonchi or wheezes, or rib retractions  Heart: Regular rate and rhythm, no murmurs or gallops Breast:Examined in sitting and supine position were symmetrical in appearance, no palpable masses or tenderness,  no skin retraction, no nipple inversion, no nipple discharge, no skin discoloration, no axillary or supraclavicular lymphadenopathy Abdomen: no palpable masses or tenderness, no rebound or guarding Extremities: no edema or skin discoloration or tenderness  Pelvic: Vulva: Normal  Vagina: No gross lesions or discharge.  Pap reflex done.  Cervix/Uterus  Adnexa  Without masses or tenderness  Anus: Normal   Assessment/Plan:  52 y.o. female for annual exam   1. Encounter for gynecological examination with abnormal finding Gynecologic exam status post total abdominal hysterectomy and bilateral salpingo-oophorectomy.  Pap reflex done on vaginal vault.  Breast exam normal.  Will schedule screening mammogram.  Health labs with family physician.  Will organize next screening colonoscopy through family  physician.  2. S/P TAH-BSO  3. RLQ abdominal pain No adnexal mass felt on gynecologic exam.  Tender towards the left pelvic wall.  Will evaluate by pelvic ultrasound for any pelvic cyst or mass.  If pelvic ultrasound is negative, patient will follow up with family physician for further investigation. - US Transvaginal Non-OB; Future  4. Post-menopause on HRT (hormone replacement therapy) Very symptomatic with severe vasomotor symptoms even on estradiol 1 mg daily.  Status post hysterectomy.  Will try estradiol patch 0.1 weekly no contraindication to hormone replacement therapy.  Usage, risks and benefits reviewed with patient.  Prescription sent to pharmacy.  5. Screening for osteoporosis Will do a vitamin D level with her family physician.  Vitamin D supplements recommended, calcium rich nutrition and regular weightbearing physical activity.  Follow-up here for bone density. - DG Bone Density; Future  Other orders - estradiol (CLIMARA - DOSED IN MG/24 HR) 0.1 mg/24hr patch; Place 1 patch (0.1 mg total) onto the skin once a week.  Counseling on above issues more than 50% for 10 minutes.  Princess Bruins MD, 12:24 PM 11/28/2017

## 2017-12-03 LAB — PAP IG W/ RFLX HPV ASCU

## 2017-12-16 ENCOUNTER — Other Ambulatory Visit: Payer: Self-pay | Admitting: Gynecology

## 2017-12-16 DIAGNOSIS — Z13820 Encounter for screening for osteoporosis: Secondary | ICD-10-CM

## 2017-12-20 ENCOUNTER — Other Ambulatory Visit: Payer: Self-pay | Admitting: Emergency Medicine

## 2017-12-20 MED ORDER — ATORVASTATIN CALCIUM 20 MG PO TABS: 20.0000 mg | ORAL_TABLET | Freq: Every day | ORAL | 0 refills | Status: DC

## 2017-12-25 ENCOUNTER — Encounter: Payer: Self-pay | Admitting: Obstetrics & Gynecology

## 2017-12-25 ENCOUNTER — Ambulatory Visit (INDEPENDENT_AMBULATORY_CARE_PROVIDER_SITE_OTHER): Payer: PRIVATE HEALTH INSURANCE

## 2017-12-25 ENCOUNTER — Ambulatory Visit (INDEPENDENT_AMBULATORY_CARE_PROVIDER_SITE_OTHER): Payer: PRIVATE HEALTH INSURANCE | Admitting: Obstetrics & Gynecology

## 2017-12-25 VITALS — BP 120/80

## 2017-12-25 DIAGNOSIS — R1031 Right lower quadrant pain: Secondary | ICD-10-CM

## 2017-12-25 DIAGNOSIS — R102 Pelvic and perineal pain: Secondary | ICD-10-CM

## 2017-12-25 DIAGNOSIS — N9412 Deep dyspareunia: Secondary | ICD-10-CM | POA: Diagnosis not present

## 2017-12-25 NOTE — Progress Notes (Signed)
    Madeline Hawkins 18-Aug-1966 [id number]        51 y.o.  [id number]   RP: Rt pelvic pain for Pelvic US  HPI: No change x last visit 10/2017 when we noted:  S/P TAH/BSO.  Had laparoscopic left oophorectomy December 2017 because of left pelvic pain and ovarian cysts.  Pathology was benign.  Menopause on Estradiol 1 mg per mouth daily.  Ran out of estradiol tablet a month ago.  Severe hot flushes/night sweats everyday/night while on estradiol tablets and even more so after running out.  C/O Right pelvic pain, worse with IC and for 20-30 minutes after.  No increase in pain from walking, but the pain never completely goes away.  Body mass index 36.31.  Urine and bowel movements normal.   OB History  Gravida Para Term Preterm AB Living  5 4 4   1 4   SAB TAB Ectopic Multiple Live Births  0   1   4    # Outcome Date GA Lbr Len/2nd Weight Sex Delivery Anes PTL Lv  5 Ectopic           4 Term     F Vag-Spont  N LIV  3 Term     M Vag-Spont  N LIV  2 Term     F Vag-Spont  N LIV  1 Term     F Vag-Spont  N LIV      Past medical history,surgical history, problem list, medications, allergies, family history and social history were all reviewed and documented in the EPIC chart.   Directed ROS with pertinent positives and negatives documented in the history of present illness/assessment and plan.  Exam:  Vitals:   12/25/17 1156  BP: 120/80   General appearance:  Normal  Pelvic US today: T/V images.  Status post total hysterectomy and bilateral salpingo-oophorectomy.  No apparent mass seen in the right and left adnexa.  Bowel shadows in the right adnexa, but no mass seen.  No free fluid in the cul-de-sac.   Assessment/Plan:  52 y.o. [id number]   1. Rt Pelvic pain in female Status post total hysterectomy and bilateral salpingo-oophorectomy.  Per ultrasound today no mass or cyst in the right or left adnexa or in the mid pelvis.  No free fluid in the cul-de-sac.  Bowel shadows present in the right  adnexa.  Patient reassured that no pelvic masses present.  Recommended modifying her nutrition and level of physical activity in an attempt to modify her bowel function.  May have increased gas in her bowels, associated or not with some bowel adhesions, as the cause of her pain.  Will refer to gastroenterology as needed.  2. Deep dyspareunia Counseling on sexual activity.  Recommend trying changes in position and avoiding deep penetration.  Counseling on above issues more than 50% for 15 minutes.  Princess Bruins MD, 12:00 PM 12/25/2017

## 2017-12-26 ENCOUNTER — Ambulatory Visit (INDEPENDENT_AMBULATORY_CARE_PROVIDER_SITE_OTHER): Payer: PRIVATE HEALTH INSURANCE

## 2017-12-26 ENCOUNTER — Encounter: Payer: Self-pay | Admitting: Obstetrics & Gynecology

## 2017-12-26 DIAGNOSIS — Z13820 Encounter for screening for osteoporosis: Secondary | ICD-10-CM

## 2017-12-26 NOTE — Patient Instructions (Signed)
1. Rt Pelvic pain in female Status post total hysterectomy and bilateral salpingo-oophorectomy.  Per ultrasound today no mass or cyst in the right or left adnexa or in the mid pelvis.  No free fluid in the cul-de-sac.  Bowel shadows present in the right adnexa.  Patient reassured that no pelvic masses present.  Recommended modifying her nutrition and level of physical activity in an attempt to modify her bowel function.  May have increased gas in her bowels, associated or not with some bowel adhesions, as the cause of her pain.  Will refer to gastroenterology as needed.  2. Deep dyspareunia Counseling on sexual activity.  Recommend trying changes in position and avoiding deep penetration.  Malachy Mood, good seeing you today!

## 2017-12-27 ENCOUNTER — Encounter: Payer: Self-pay | Admitting: Gynecology

## 2018-01-23 ENCOUNTER — Other Ambulatory Visit: Payer: Self-pay

## 2018-01-23 ENCOUNTER — Encounter: Payer: Self-pay | Admitting: Physician Assistant

## 2018-01-23 ENCOUNTER — Ambulatory Visit (INDEPENDENT_AMBULATORY_CARE_PROVIDER_SITE_OTHER): Payer: PRIVATE HEALTH INSURANCE | Admitting: Physician Assistant

## 2018-01-23 VITALS — BP 124/80 | HR 72 | Temp 98.3°F | Resp 16 | Ht 63.0 in | Wt 206.0 lb

## 2018-01-23 DIAGNOSIS — I10 Essential (primary) hypertension: Secondary | ICD-10-CM | POA: Diagnosis not present

## 2018-01-23 DIAGNOSIS — E785 Hyperlipidemia, unspecified: Secondary | ICD-10-CM | POA: Diagnosis not present

## 2018-01-23 DIAGNOSIS — Z0000 Encounter for general adult medical examination without abnormal findings: Secondary | ICD-10-CM | POA: Insufficient documentation

## 2018-01-23 LAB — CBC WITH DIFFERENTIAL/PLATELET
BASOS PCT: 0.7 % (ref 0.0–3.0)
Basophils Absolute: 0 10*3/uL (ref 0.0–0.1)
EOS ABS: 0.1 10*3/uL (ref 0.0–0.7)
Eosinophils Relative: 1.4 % (ref 0.0–5.0)
HEMATOCRIT: 39 % (ref 36.0–46.0)
HEMOGLOBIN: 12.9 g/dL (ref 12.0–15.0)
LYMPHS PCT: 47.8 % — AB (ref 12.0–46.0)
Lymphs Abs: 2.2 10*3/uL (ref 0.7–4.0)
MCHC: 33.1 g/dL (ref 30.0–36.0)
MCV: 84 fl (ref 78.0–100.0)
MONOS PCT: 8.1 % (ref 3.0–12.0)
Monocytes Absolute: 0.4 10*3/uL (ref 0.1–1.0)
NEUTROS ABS: 2 10*3/uL (ref 1.4–7.7)
Neutrophils Relative %: 42 % — ABNORMAL LOW (ref 43.0–77.0)
PLATELETS: 220 10*3/uL (ref 150.0–400.0)
RBC: 4.65 Mil/uL (ref 3.87–5.11)
RDW: 16.4 % — AB (ref 11.5–15.5)
WBC: 4.6 10*3/uL (ref 4.0–10.5)

## 2018-01-23 LAB — LIPID PANEL
CHOLESTEROL: 153 mg/dL (ref 0–200)
HDL: 42.6 mg/dL (ref >39.00)
LDL Cholesterol: 100 mg/dL — ABNORMAL HIGH (ref 0–99)
NonHDL: 110.56
TRIGLYCERIDES: 52 mg/dL (ref 0.0–149.0)
Total CHOL/HDL Ratio: 4
VLDL: 10.4 mg/dL (ref 0.0–40.0)

## 2018-01-23 LAB — COMPREHENSIVE METABOLIC PANEL
ALT: 22 U/L (ref 0–35)
AST: 19 U/L (ref 0–37)
Albumin: 4.1 g/dL (ref 3.5–5.2)
Alkaline Phosphatase: 65 U/L (ref 39–117)
BILIRUBIN TOTAL: 0.5 mg/dL (ref 0.2–1.2)
BUN: 13 mg/dL (ref 6–23)
CALCIUM: 9.3 mg/dL (ref 8.4–10.5)
CHLORIDE: 102 meq/L (ref 96–112)
CO2: 31 meq/L (ref 19–32)
CREATININE: 0.73 mg/dL (ref 0.40–1.20)
GFR: 108.72 mL/min (ref >60.00)
Glucose, Bld: 94 mg/dL (ref 70–99)
Potassium: 4 mEq/L (ref 3.5–5.1)
Sodium: 138 mEq/L (ref 135–145)
Total Protein: 6.7 g/dL (ref 6.0–8.3)

## 2018-01-23 LAB — HEMOGLOBIN A1C: HEMOGLOBIN A1C: 5.9 % (ref 4.6–6.5)

## 2018-01-23 LAB — SEDIMENTATION RATE: SED RATE: 17 mm/h (ref 0–20)

## 2018-01-23 NOTE — Patient Instructions (Signed)
Please go to the lab for blood work.   Our office will call you with your results unless you have chosen to receive results via MyChart.  If your blood work is normal we will follow-up each year for physicals and as scheduled for chronic medical problems.  If anything is abnormal we will treat accordingly and get you in for a follow-up.  Schedule your Mammogram at your earliest convenience.  Call you Gastroenterologist to schedule your repeat colonoscopy.    Preventive Care 40-64 Years, Female Preventive care refers to lifestyle choices and visits with your health care provider that can promote health and wellness. What does preventive care include?  A yearly physical exam. This is also called an annual well check.  Dental exams once or twice a year.  Routine eye exams. Ask your health care provider how often you should have your eyes checked.  Personal lifestyle choices, including: ? Daily care of your teeth and gums. ? Regular physical activity. ? Eating a healthy diet. ? Avoiding tobacco and drug use. ? Limiting alcohol use. ? Practicing safe sex. ? Taking low-dose aspirin daily starting at age 57. ? Taking vitamin and mineral supplements as recommended by your health care provider. What happens during an annual well check? The services and screenings done by your health care provider during your annual well check will depend on your age, overall health, lifestyle risk factors, and family history of disease. Counseling Your health care provider may ask you questions about your:  Alcohol use.  Tobacco use.  Drug use.  Emotional well-being.  Home and relationship well-being.  Sexual activity.  Eating habits.  Work and work Statistician.  Method of birth control.  Menstrual cycle.  Pregnancy history.  Screening You may have the following tests or measurements:  Height, weight, and BMI.  Blood pressure.  Lipid and cholesterol levels. These may be checked  every 5 years, or more frequently if you are over 44 years old.  Skin check.  Lung cancer screening. You may have this screening every year starting at age 1 if you have a 30-pack-year history of smoking and currently smoke or have quit within the past 15 years.  Fecal occult blood test (FOBT) of the stool. You may have this test every year starting at age 75.  Flexible sigmoidoscopy or colonoscopy. You may have a sigmoidoscopy every 5 years or a colonoscopy every 10 years starting at age 34.  Hepatitis C blood test.  Hepatitis B blood test.  Sexually transmitted disease (STD) testing.  Diabetes screening. This is done by checking your blood sugar (glucose) after you have not eaten for a while (fasting). You may have this done every 1-3 years.  Mammogram. This may be done every 1-2 years. Talk to your health care provider about when you should start having regular mammograms. This may depend on whether you have a family history of breast cancer.  BRCA-related cancer screening. This may be done if you have a family history of breast, ovarian, tubal, or peritoneal cancers.  Pelvic exam and Pap test. This may be done every 3 years starting at age 27. Starting at age 62, this may be done every 5 years if you have a Pap test in combination with an HPV test.  Bone density scan. This is done to screen for osteoporosis. You may have this scan if you are at high risk for osteoporosis.  Discuss your test results, treatment options, and if necessary, the need for more tests with your  health care provider. Vaccines Your health care provider may recommend certain vaccines, such as:  Influenza vaccine. This is recommended every year.  Tetanus, diphtheria, and acellular pertussis (Tdap, Td) vaccine. You may need a Td booster every 10 years.  Varicella vaccine. You may need this if you have not been vaccinated.  Zoster vaccine. You may need this after age 58.  Measles, mumps, and rubella (MMR)  vaccine. You may need at least one dose of MMR if you were born in 1957 or later. You may also need a second dose.  Pneumococcal 13-valent conjugate (PCV13) vaccine. You may need this if you have certain conditions and were not previously vaccinated.  Pneumococcal polysaccharide (PPSV23) vaccine. You may need one or two doses if you smoke cigarettes or if you have certain conditions.  Meningococcal vaccine. You may need this if you have certain conditions.  Hepatitis A vaccine. You may need this if you have certain conditions or if you travel or work in places where you may be exposed to hepatitis A.  Hepatitis B vaccine. You may need this if you have certain conditions or if you travel or work in places where you may be exposed to hepatitis B.  Haemophilus influenzae type b (Hib) vaccine. You may need this if you have certain conditions.  Talk to your health care provider about which screenings and vaccines you need and how often you need them. This information is not intended to replace advice given to you by your health care provider. Make sure you discuss any questions you have with your health care provider. Document Released: 11/11/2015 Document Revised: 07/04/2016 Document Reviewed: 08/16/2015 Elsevier Interactive Patient Education  [employer]. .

## 2018-01-23 NOTE — Assessment & Plan Note (Signed)
Repeat labs today. Patient is fasting.

## 2018-01-23 NOTE — Progress Notes (Signed)
Patient presents to clinic today for annual exam.  Patient is fasting for labs. Diet -- Endorses well-balanced diet overall. Exercise -- No regular regimen at the moment. She and husband are going to start working out.   Acute Concerns: Denies acute concerns today.  Chronic Issues: Hyperlipidemia -- Is currently on a regimen of Atorvastatin 20 mg daily. Is taking as directed. Denies side effects.   Hypertension -- Is currently on a regimen of HCTZ daily. Is taking as directed and tolerating well. HAs Rx for Klor-Con 20 mEq daily. Has run out of medication. Patient denies chest pain, palpitations, lightheadedness, dizziness, vision changes or frequent headaches.  BP Readings from Last 3 Encounters:  01/23/18 124/80  12/25/17 120/80  11/28/17 132/86   Health Maintenance: Immunizations -- Tetanus up-to-date. Flu shot up-to-date. Mammogram -- Due. Is scheduling through her GYN.  PAP -- s/p hysterectomy  Past Medical History:  Diagnosis Date  . Anxiety   . CIN I (cervical intraepithelial neoplasia I) 1989   cryo...also in 2008 .. had cryo   . GERD (gastroesophageal reflux disease)   . Hyperlipidemia   . Hypertension     Past Surgical History:  Procedure Laterality Date  . ABDOMINAL HYSTERECTOMY  11/22/2011   Procedure: HYSTERECTOMY ABDOMINAL;  Surgeon: Terrance Mass, MD;  Location: [facility];  Service: Gynecology;  Laterality: N/A;  . colon polyps removed    . GYNECOLOGIC CRYOSURGERY  07/07/2007  . LAPAROSCOPIC LYSIS OF ADHESIONS  06/28/2015   Procedure: LAPAROSCOPIC LYSIS OF ABDOMINAL PELVIC ADHESIONS;  Surgeon: Terrance Mass, MD;  Location: [facility];  Service: Gynecology;;  . LAPAROSCOPIC UNILATERAL SALPINGO OOPHERECTOMY Right 06/28/2015   Procedure: LAPAROSCOPIC RIGHT SALPINGO OOPHORECTOMY;  Surgeon: Terrance Mass, MD;  Location: [facility];  Service: Gynecology;  Laterality: Right;  . LAPAROSCOPIC UNILATERAL SALPINGO OOPHERECTOMY Left 10/11/2016   Procedure: LAPAROSCOPIC  UNILATERAL OOPHORECTOMY with pelvic washings;  Surgeon: Terrance Mass, MD;  Location: [facility];  Service: Gynecology;  Laterality: Left;  HARMONIC SCAPEL  . LAPAROSCOPY  11/22/2011   Procedure: LAPAROSCOPY DIAGNOSTIC;  Surgeon: Terrance Mass, MD;  Location: [facility];  Service: Gynecology;  Laterality: N/A;  . OVARIAN CYST REMOVAL  11/22/2011   Procedure: OVARIAN CYSTECTOMY;  Surgeon: Terrance Mass, MD;  Location: [facility];  Service: Gynecology;  Laterality: Left;  . SALPINGECTOMY     left for ectopic pregnancy  . TUBAL LIGATION     right tubal ligation    Current Outpatient Medications on File Prior to Visit  Medication Sig Dispense Refill  . atorvastatin (LIPITOR) 20 MG tablet Take 1 tablet (20 mg total) by mouth daily. 90 tablet 0  . estradiol (CLIMARA - DOSED IN MG/24 HR) 0.1 mg/24hr patch Place 1 patch (0.1 mg total) onto the skin once a week. 12 patch 4  . hydrochlorothiazide (HYDRODIURIL) 12.5 MG tablet TAKE 1 TABLET BY MOUTH EVERY DAY 90 tablet 0  . meclizine (ANTIVERT) 25 MG tablet Take 1 tablet (25 mg total) by mouth 3 (three) times daily as needed for dizziness. 30 tablet 0  . Multiple Vitamins-Minerals (WOMENS MULTIVITAMIN PLUS) TABS Take by mouth daily.    Marland Kitchen KLOR-CON M20 20 MEQ tablet TAKE 1 TABLET (20 MEQ TOTAL) BY MOUTH DAILY. (Patient not taking: Reported on 01/23/2018) 90 tablet 0   No current facility-administered medications on file prior to visit.     No Known Allergies  Family History  Problem Relation Age of Onset  . Diabetes Brother   . Ovarian cancer  Mother   . Ovarian cancer Paternal Aunt   . Heart disease Maternal Grandmother   . Breast cancer Maternal Grandmother        Age 26's  . Breast cancer Maternal Aunt        Age 51's    Social History   Socioeconomic History  . Marital status: Married    Spouse name: Not on file  . Number of children: Not on file  . Years of education: Not on file  . Highest education level: Not on file  Occupational  History  . Not on file  Social Needs  . Financial resource strain: Not on file  . Food insecurity:    Worry: Not on file    Inability: Not on file  . Transportation needs:    Medical: Not on file    Non-medical: Not on file  Tobacco Use  . Smoking status: Never Smoker  . Smokeless tobacco: Never Used  Substance and Sexual Activity  . Alcohol use: No    Alcohol/week: 0.0 oz  . Drug use: No  . Sexual activity: Yes    Partners: Male    Birth control/protection: Surgical    Comment: 1st intercourse- 19, partners- 70, married- 24 yrs   Lifestyle  . Physical activity:    Days per week: Not on file    Minutes per session: Not on file  . Stress: Not on file  Relationships  . Social connections:    Talks on phone: Not on file    Gets together: Not on file    Attends religious service: Not on file    Active member of club or organization: Not on file    Attends meetings of clubs or organizations: Not on file    Relationship status: Not on file  . Intimate partner violence:    Fear of current or ex partner: Not on file    Emotionally abused: Not on file    Physically abused: Not on file    Forced sexual activity: Not on file  Other Topics Concern  . Not on file  Social History Narrative  . Not on file    Review of Systems  Constitutional: Negative for fever and weight loss.  HENT: Negative for ear discharge, ear pain, hearing loss and tinnitus.   Eyes: Negative for blurred vision, double vision, photophobia and pain.  Respiratory: Negative for cough and shortness of breath.   Cardiovascular: Negative for chest pain and palpitations.  Gastrointestinal: Negative for abdominal pain, blood in stool, constipation, diarrhea, heartburn, melena, nausea and vomiting.  Genitourinary: Negative for dysuria, flank pain, frequency, hematuria and urgency.  Musculoskeletal: Negative for falls.  Neurological: Negative for dizziness, loss of consciousness and headaches.  Endo/Heme/Allergies:  Negative for environmental allergies.  Psychiatric/Behavioral: Negative for depression, hallucinations, substance abuse and suicidal ideas. The patient is not nervous/anxious and does not have insomnia.    BP 124/80   Pulse 72   Temp 98.3 F (36.8 C) (Oral)   Resp 16   Ht 5\' 3"  (1.6 m)   Wt 206 lb (93.4 kg)   LMP 11/10/2011   SpO2 96%   BMI 36.49 kg/m   Physical Exam  Constitutional: She is oriented to person, place, and time and well-developed, well-nourished, and in no distress.  HENT:  Head: Normocephalic and atraumatic.  Right Ear: Tympanic membrane, external ear and ear canal normal.  Left Ear: Tympanic membrane, external ear and ear canal normal.  Nose: Nose normal. No mucosal edema.  Mouth/Throat:  Uvula is midline, oropharynx is clear and moist and mucous membranes are normal. No oropharyngeal exudate or posterior oropharyngeal erythema.  Eyes: Pupils are equal, round, and reactive to light. Conjunctivae are normal.  Neck: Neck supple. No thyromegaly present.  Cardiovascular: Normal rate, regular rhythm, normal heart sounds and intact distal pulses.  Pulmonary/Chest: Effort normal and breath sounds normal. No respiratory distress. She has no wheezes. She has no rales.  Abdominal: Soft. Bowel sounds are normal. She exhibits no distension and no mass. There is no tenderness. There is no rebound and no guarding.  Lymphadenopathy:    She has no cervical adenopathy.  Neurological: She is alert and oriented to person, place, and time. No cranial nerve deficit.  Skin: Skin is warm and dry. No rash noted.  Psychiatric: Affect normal.  Vitals reviewed.   Recent Results (from the past 2160 hour(s))  Pap IG w/ reflex to HPV when ASC-U     Status: None   Collection Time: 11/28/17  4:27 PM  Result Value Ref Range   Clinical Information:      Comment: None given   LMP:      Comment: NONE GIVEN   PREV. PAP:      Comment: NONE GIVEN   PREV. BX:      Comment: NONE GIVEN   HPV  DNA Probe-Source      Comment: Vagina   STATEMENT OF ADEQUACY:      Comment: SATISFACTORY FOR EVALUATION   INTERPRETATION/RESULT:      Comment: Negative for intraepithelial lesion or malignancy.   Comment:      Comment: This Pap test has been evaluated with computer assisted technology.    CYTOTECHNOLOGIST:      Comment: [facility], CT(HEW) CT screening location: [address], [place], [place] [address] EXPLANATORY NOTE:  . The Pap is a screening test for cervical cancer. It is  not a diagnostic test and is subject to false negative  and false positive results. It is most reliable when a  satisfactory sample, regularly obtained, is submitted  with relevant clinical findings and history, and when  the Pap result is evaluated along with historic and  current clinical information. .     Assessment/Plan: HTN (hypertension) BP normotensive. Asymptomatic. Continue current regimen. CMP today.  Visit for preventive health examination Depression screen negative. Health Maintenance reviewed -- Is scheduling repeat colonoscopy and mammogram. Preventive schedule discussed and handout given in AVS. Will obtain fasting labs today.   Hyperlipidemia Repeat labs today. Patient is fasting.     Leeanne Rio, PA-C

## 2018-01-23 NOTE — Assessment & Plan Note (Signed)
Depression screen negative. Health Maintenance reviewed -- Is scheduling repeat colonoscopy and mammogram. Preventive schedule discussed and handout given in AVS. Will obtain fasting labs today.

## 2018-01-23 NOTE — Assessment & Plan Note (Signed)
BP normotensive. Asymptomatic. Continue current regimen. CMP today.

## 2018-02-06 ENCOUNTER — Other Ambulatory Visit: Payer: Self-pay | Admitting: Physician Assistant

## 2018-02-16 ENCOUNTER — Other Ambulatory Visit: Payer: Self-pay | Admitting: Physician Assistant

## 2018-02-16 ENCOUNTER — Other Ambulatory Visit: Payer: Self-pay | Admitting: Obstetrics & Gynecology

## 2018-03-17 ENCOUNTER — Other Ambulatory Visit: Payer: Self-pay | Admitting: Physician Assistant

## 2018-03-31 ENCOUNTER — Emergency Department (HOSPITAL_BASED_OUTPATIENT_CLINIC_OR_DEPARTMENT_OTHER): Payer: PRIVATE HEALTH INSURANCE

## 2018-03-31 ENCOUNTER — Encounter (HOSPITAL_BASED_OUTPATIENT_CLINIC_OR_DEPARTMENT_OTHER): Payer: Self-pay | Admitting: Emergency Medicine

## 2018-03-31 ENCOUNTER — Emergency Department (HOSPITAL_BASED_OUTPATIENT_CLINIC_OR_DEPARTMENT_OTHER)
Admission: EM | Admit: 2018-03-31 | Discharge: 2018-03-31 | Disposition: A | Discharge status: Home / Self Care | Payer: PRIVATE HEALTH INSURANCE | Attending: Emergency Medicine | Admitting: Emergency Medicine

## 2018-03-31 ENCOUNTER — Ambulatory Visit: Payer: Self-pay

## 2018-03-31 ENCOUNTER — Other Ambulatory Visit: Payer: Self-pay

## 2018-03-31 DIAGNOSIS — R0789 Other chest pain: Secondary | ICD-10-CM | POA: Diagnosis not present

## 2018-03-31 DIAGNOSIS — R079 Chest pain, unspecified: Secondary | ICD-10-CM | POA: Diagnosis present

## 2018-03-31 DIAGNOSIS — I10 Essential (primary) hypertension: Secondary | ICD-10-CM | POA: Insufficient documentation

## 2018-03-31 DIAGNOSIS — Z79899 Other long term (current) drug therapy: Secondary | ICD-10-CM | POA: Diagnosis not present

## 2018-03-31 LAB — BASIC METABOLIC PANEL
ANION GAP: 9 (ref 5–15)
BUN: 11 mg/dL (ref 6–20)
CO2: 30 mmol/L (ref 22–32)
Calcium: 9.2 mg/dL (ref 8.9–10.3)
Chloride: 101 mmol/L (ref 101–111)
Creatinine, Ser: 0.82 mg/dL (ref 0.44–1.00)
GFR calc Af Amer: >60 mL/min (ref >60)
Glucose, Bld: 90 mg/dL (ref 65–99)
POTASSIUM: 3.2 mmol/L — AB (ref 3.5–5.1)
Sodium: 140 mmol/L (ref 135–145)

## 2018-03-31 LAB — CBC
HEMATOCRIT: 38.9 % (ref 36.0–46.0)
Hemoglobin: 12.9 g/dL (ref 12.0–15.0)
MCH: 28.1 pg (ref 26.0–34.0)
MCHC: 33.2 g/dL (ref 30.0–36.0)
MCV: 84.7 fL (ref 78.0–100.0)
PLATELETS: 203 10*3/uL (ref 150–400)
RBC: 4.59 MIL/uL (ref 3.87–5.11)
RDW: 15.2 % (ref 11.5–15.5)
WBC: 5.6 10*3/uL (ref 4.0–10.5)

## 2018-03-31 LAB — TROPONIN I: Troponin I: <0.03 ng/mL (ref <0.03)

## 2018-03-31 LAB — D-DIMER, QUANTITATIVE: D-Dimer, Quant: <0.27 ug/mL-FEU (ref 0.00–0.50)

## 2018-03-31 MED ORDER — IPRATROPIUM-ALBUTEROL 0.5-2.5 (3) MG/3ML IN SOLN
RESPIRATORY_TRACT | Status: AC
  Administered 2018-03-31: 3 mL
  Filled 2018-03-31: qty 3

## 2018-03-31 MED ORDER — ALBUTEROL SULFATE (2.5 MG/3ML) 0.083% IN NEBU
INHALATION_SOLUTION | RESPIRATORY_TRACT | Status: AC
  Administered 2018-03-31: 2.5 mg
  Filled 2018-03-31: qty 3

## 2018-03-31 NOTE — Telephone Encounter (Signed)
Pt. Reports she started having chest and back pain 2 weeks ago that has gotten worse. Reports pain when she takes a deep breath in her chest and back. Pain "is all the time now." Rates pain a "9". Reports dizziness, nausea and night sweats. Pt. States she is at work now. Instructed to go to ED for evaluation. Verbalizes understanding. Will have someone take her.  Reason for Disposition . Taking a deep breath makes pain worse  Answer Assessment - Initial Assessment Questions 1. LOCATION: "Where does it hurt?"       Middle of chest  2. RADIATION: "Does the pain go anywhere else?" (e.g., into neck, jaw, arms, back)     To the back - on her sides 3. ONSET: "When did the chest pain begin?" (Minutes, hours or days)      2 weeks ago 4. PATTERN "Does the pain come and go, or has it been constant since it started?"  "Does it get worse with exertion?"      Constant 5. DURATION: "How long does it last" (e.g., seconds, minutes, hours)     Constant 6. SEVERITY: "How bad is the pain?"  (e.g., Scale 1-10; mild, moderate, or severe)    - MILD (1-3): doesn't interfere with normal activities     - MODERATE (4-7): interferes with normal activities or awakens from sleep    - SEVERE (8-10): excruciating pain, unable to do any normal activities       9 7. CARDIAC RISK FACTORS: "Do you have any history of heart problems or risk factors for heart disease?" (e.g., prior heart attack, angina; high blood pressure, diabetes, being overweight, high cholesterol, smoking, or strong family history of heart disease)     HTN, Cholesterol 8. PULMONARY RISK FACTORS: "Do you have any history of lung disease?"  (e.g., blood clots in lung, asthma, emphysema, birth control pills)     No 9. CAUSE: "What do you think is causing the chest pain?"     Maybe my cough 10. OTHER SYMPTOMS: "Do you have any other symptoms?" (e.g., dizziness, nausea, vomiting, sweating, fever, difficulty breathing, cough)       Dizziness, nausea, night  sweats 11. PREGNANCY: "Is there any chance you are pregnant?" "When was your last menstrual period?"       No  Protocols used: CHEST PAIN-A-AH

## 2018-03-31 NOTE — Discharge Instructions (Addendum)
Your blood work, EKG and chest x-ray were reassuring today.  Please return to the ER if you have any new or worsening concerns including trouble catching your breath, chest pain that radiates to the left arm or jaw, chest pain with lightheadedness or dizziness, chest pain with sweating or you have any new or concerning symptoms

## 2018-03-31 NOTE — ED Triage Notes (Signed)
Patient states that she is having a course cough with some chest congestion and tightness  - patient states that it only hurts when she coughs

## 2018-03-31 NOTE — ED Provider Notes (Signed)
[facility] EMERGENCY DEPARTMENT Provider Note   CSN: [id number] Arrival date & time: 03/31/18  1221     History   Chief Complaint Chief Complaint  Patient presents with  . Cough    HPI Madeline Hawkins is a 52 y.o. female.  HPI   Madeline Hawkins is a 52yo female with a history of hypertension and hyperlipidemia who presents to the emergency department for evaluation of chest pain, shortness of breath and cough.  She states that her symptoms began last week.  States that she has had a dry cough and intermittent wheezing.  Denies history of asthma or ever needing an inhaler. Reports 9/10 severity constant substernal chest pain as well which radiates to her bilateral upper back.  Reports pain has been constant and unchanged >6hrs. She reports pain in her chest feels "like a brain freeze."  Reports her pain is worsened with deep breathing or with palpation over the chest or back. She also reports some shortness of breath which is worse with ambulation. Reports shortness of breath is worsened with ambulation. States her voice is more high pitch as well. Denies fever, chills, congestion, sore throat, headache, diaphoresis, lightheadedness, nausea/vomiting, abdominal pain, numbness, weakness. She denies history of exertional chest pain. Denies family history of MI that she is aware of. She denies tobacco use. She denies hx of DVT/PE, recent surgery, active cancer or hemoptysis. Does report using estrogen patch and states that she has had swelling in her left leg for months.    Past Medical History:  Diagnosis Date  . Anxiety   . CIN I (cervical intraepithelial neoplasia I) 1989   cryo...also in 2008 .. had cryo   . GERD (gastroesophageal reflux disease)   . Hyperlipidemia   . Hypertension     Patient Active Problem List   Diagnosis Date Noted  . Visit for preventive health examination 01/23/2018  . Ovarian cyst, left 08/22/2016  . GERD (gastroesophageal reflux disease) 02/01/2016    . Adjustment disorder with depressed mood 08/05/2015  . Hyperlipidemia 11/14/2012  . HTN (hypertension) 02/04/2012  . Anxiety 02/04/2012  . Anemia 11/13/2011    Past Surgical History:  Procedure Laterality Date  . ABDOMINAL HYSTERECTOMY  11/22/2011   Procedure: HYSTERECTOMY ABDOMINAL;  Surgeon: Madeline Mass, MD;  Location: [facility];  Service: Gynecology;  Laterality: N/A;  . colon polyps removed    . GYNECOLOGIC CRYOSURGERY  07/07/2007  . LAPAROSCOPIC LYSIS OF ADHESIONS  06/28/2015   Procedure: LAPAROSCOPIC LYSIS OF ABDOMINAL PELVIC ADHESIONS;  Surgeon: Madeline Mass, MD;  Location: [facility];  Service: Gynecology;;  . LAPAROSCOPIC UNILATERAL SALPINGO OOPHERECTOMY Right 06/28/2015   Procedure: LAPAROSCOPIC RIGHT SALPINGO OOPHORECTOMY;  Surgeon: Madeline Mass, MD;  Location: [facility];  Service: Gynecology;  Laterality: Right;  . LAPAROSCOPIC UNILATERAL SALPINGO OOPHERECTOMY Left 10/11/2016   Procedure: LAPAROSCOPIC UNILATERAL OOPHORECTOMY with pelvic washings;  Surgeon: Madeline Mass, MD;  Location: [facility];  Service: Gynecology;  Laterality: Left;  HARMONIC SCAPEL  . LAPAROSCOPY  11/22/2011   Procedure: LAPAROSCOPY DIAGNOSTIC;  Surgeon: Madeline Mass, MD;  Location: [facility];  Service: Gynecology;  Laterality: N/A;  . OVARIAN CYST REMOVAL  11/22/2011   Procedure: OVARIAN CYSTECTOMY;  Surgeon: Madeline Mass, MD;  Location: [facility];  Service: Gynecology;  Laterality: Left;  . SALPINGECTOMY     left for ectopic pregnancy  . TUBAL LIGATION     right tubal ligation     OB History    Gravida  5  Para  4   Term  4   Preterm      AB  1   Living  4     SAB  0   TAB      Ectopic  1   Multiple      Live Births  4            Home Medications    Prior to Admission medications   Medication Sig Start Date End Date Taking? Authorizing Provider  atorvastatin (LIPITOR) 20 MG tablet TAKE 1 TABLET BY MOUTH EVERY DAY 03/17/18   Madeline Jeans, PA-Hawkins  estradiol  (CLIMARA - DOSED IN MG/24 HR) 0.1 mg/24hr patch PLACE 1 PATCH (0.1 MG TOTAL) ONTO THE SKIN ONCE A WEEK. 02/17/18   Madeline Bruins, MD  hydrochlorothiazide (HYDRODIURIL) 12.5 MG tablet TAKE 1 TABLET BY MOUTH EVERY DAY 02/07/18   Madeline Hawkins C, PA-Hawkins  KLOR-CON M20 20 MEQ tablet TAKE 1 TABLET (20 MEQ TOTAL) BY MOUTH DAILY. Patient not taking: Reported on 01/23/2018 04/03/17   Madeline Jeans, PA-Hawkins  meclizine (ANTIVERT) 25 MG tablet Take 1 tablet (25 mg total) by mouth 3 (three) times daily as needed for dizziness. 10/01/17   Madeline Jeans, PA-Hawkins  Multiple Vitamins-Minerals (WOMENS MULTIVITAMIN PLUS) TABS Take by mouth daily.    [provider]    Family History Family History  Problem Relation Age of Onset  . Diabetes Brother   . Ovarian cancer Mother   . Ovarian cancer Paternal Aunt   . Heart disease Maternal Grandmother   . Breast cancer Maternal Grandmother        Age 97's  . Breast cancer Maternal Aunt        Age 92's    Social History Social History   Tobacco Use  . Smoking status: Never Smoker  . Smokeless tobacco: Never Used  Substance Use Topics  . Alcohol use: No    Alcohol/week: 0.0 oz  . Drug use: No     Allergies   Patient has no known allergies.   Review of Systems Review of Systems  Constitutional: Negative for chills, diaphoresis and fever.  HENT: Positive for voice change. Negative for congestion, ear pain, sore throat and trouble swallowing.   Respiratory: Positive for cough, chest tightness, shortness of breath and wheezing.   Cardiovascular: Positive for chest pain.  Gastrointestinal: Negative for abdominal pain, nausea and vomiting.  Genitourinary: Negative for difficulty urinating.  Musculoskeletal: Positive for back pain (bilateral thoracic).  Skin: Negative for rash.  Neurological: Negative for dizziness, weakness, light-headedness and numbness.  Psychiatric/Behavioral: Negative for agitation.     Physical Exam Updated Vital  Signs BP 118/79 (BP Location: Left Arm)   Pulse 79   Temp 98.2 F (36.8 Hawkins) (Oral)   Resp 18   Ht 5\' 3"  (1.6 m)   Wt 94.3 kg (208 lb)   LMP 11/10/2011   SpO2 99%   BMI 36.85 kg/m   Physical Exam  Constitutional: She is oriented to person, place, and time. She appears well-developed and well-nourished. No distress.  HENT:  Head: Normocephalic and atraumatic.  Mouth/Throat: Oropharynx is clear and moist. No oropharyngeal exudate.  Eyes: Pupils are equal, round, and reactive to light. Conjunctivae are normal. Right eye exhibits no discharge. Left eye exhibits no discharge.  Neck: Normal range of motion. Neck supple. No JVD present. No tracheal deviation present.  Cardiovascular: Normal rate, regular rhythm and intact distal pulses.  No murmur heard. Pulmonary/Chest: Effort normal and breath  sounds normal. No stridor. No respiratory distress. She has no wheezes. She has no rales.      Acutely tender to palpation over the sternum.     Abdominal: Soft. There is no tenderness.  Musculoskeletal:  No leg swelling or calf tenderness. Tender to palpation over left and right upper back as depicted in image. No ecchymosis or rash noted.   Neurological: She is alert and oriented to person, place, and time. Coordination normal.  Skin: Skin is warm and dry. She is not diaphoretic.  Psychiatric: She has a normal mood and affect. Her behavior is normal.  Nursing note and vitals reviewed.    ED Treatments / Results  Labs (all labs ordered are listed, but only abnormal results are displayed) Labs Reviewed  BASIC METABOLIC PANEL - Abnormal; Notable for the following components:      Result Value   Potassium 3.2 (*)    All other components within normal limits  TROPONIN I  D-DIMER, QUANTITATIVE (NOT AT [facility])  CBC    EKG EKG Interpretation  Date/Time:  Monday March 31 2018 12:46:40 EDT Ventricular Rate:  84 PR Interval:  164 QRS Duration: 84 QT Interval:  396 QTC  Calculation: 467 R Axis:   64 Text Interpretation:  Normal sinus rhythm Normal ECG When compared to prior, no significant changes seen. No STEMI Confirmed by Antony Blackbird [id number]) on 03/31/2018 3:27:01 PM   Radiology Dg Chest 2 View  Result Date: 03/31/2018 CLINICAL DATA:  Cough and congestion EXAM: CHEST - 2 VIEW COMPARISON:  Chest radiograph February 14, 2015 and chest CT February 14, 2015 FINDINGS: There is no edema or consolidation. The heart size and pulmonary vascularity are normal. No adenopathy. No bone lesions. IMPRESSION: No edema or consolidation. Electronically Signed   By: Lowella Grip III M.D.   On: 03/31/2018 13:13    Procedures Procedures (including critical care time)  Medications Ordered in ED Medications  albuterol (PROVENTIL) (2.5 MG/3ML) 0.083% nebulizer solution (2.5 mg  Given 03/31/18 1255)  ipratropium-albuterol (DUONEB) 0.5-2.5 (3) MG/3ML nebulizer solution (3 mLs  Given 03/31/18 1255)     Initial Impression / Assessment and Plan / ED Course  I have reviewed the triage vital signs and the nursing notes.  Pertinent labs & imaging results that were available during my care of the patient were reviewed by me and considered in my medical decision making (see chart for details).     Do not suspect ACS given EKG nonischemic and troponin negative with patient reporting constant and unchanged pain for >6hrs. Ddimer negative, doubt PE. No widening of mediastinum, neurological symptoms, new murmur or pulse deficits to suggest aortic dissection. CXR without acute abnormality, no pneumonia, pneumothorax or pneumomediastinum. Patient afebrile and non-toxic, do not suspect esophageal perforation. Patient's symptoms likely musculoskeletal given recent cough and seems to be much worse with palpation of the sternum or upper back. No wheezing and lungs CTA, patient reports no change with breathing tx in the ED therefore doubt reactive airway. Have offered patient cough suppressant and she  declines. Discussed reasons to return to the ED and patient agrees and voices understanding to the above plan and appears reliable for follow up.   Final Clinical Impressions(s) / ED Diagnoses   Final diagnoses:  Atypical chest pain    ED Discharge Orders    None       Bernarda Caffey 03/31/18 2042    Tegeler, Gwenyth Allegra, MD 04/01/18 [phone]

## 2018-06-24 ENCOUNTER — Other Ambulatory Visit: Payer: Self-pay | Admitting: Nurse Practitioner

## 2018-06-24 ENCOUNTER — Encounter: Payer: Self-pay | Admitting: Nurse Practitioner

## 2018-06-24 ENCOUNTER — Other Ambulatory Visit: Payer: Self-pay | Admitting: Physician Assistant

## 2018-06-24 DIAGNOSIS — J321 Chronic frontal sinusitis: Secondary | ICD-10-CM

## 2018-06-24 DIAGNOSIS — H6690 Otitis media, unspecified, unspecified ear: Secondary | ICD-10-CM

## 2018-06-24 DIAGNOSIS — H6502 Acute serous otitis media, left ear: Secondary | ICD-10-CM

## 2018-06-24 MED ORDER — MECLIZINE HCL 25 MG PO TABS: 25.0000 mg | ORAL_TABLET | Freq: Three times a day (TID) | ORAL | 0 refills | Status: DC | PRN

## 2018-06-24 MED ORDER — AMOXICILLIN 875 MG PO TABS: 875.0000 mg | ORAL_TABLET | Freq: Two times a day (BID) | ORAL | 0 refills | Status: AC

## 2018-06-24 NOTE — Progress Notes (Signed)
Madeline Hawkins was seen at worksite clinic today for sinusitis, AOM and vertigo. SOAP on paper. Meds eprescribed.

## 2018-06-24 NOTE — Progress Notes (Signed)
Pt seen at worksite visit today for c/o left ear pain and dizziness. Paper chart SOAP done today.  Assessment: left otitis media, vertigo and sinusitis

## 2018-08-05 ENCOUNTER — Other Ambulatory Visit: Payer: Self-pay | Admitting: Physician Assistant

## 2018-09-13 ENCOUNTER — Other Ambulatory Visit: Payer: Self-pay | Admitting: Physician Assistant

## 2018-10-03 ENCOUNTER — Other Ambulatory Visit: Payer: Self-pay | Admitting: Physician Assistant

## 2018-10-09 ENCOUNTER — Encounter: Payer: Self-pay | Admitting: Physician Assistant

## 2018-10-09 ENCOUNTER — Ambulatory Visit: Payer: PRIVATE HEALTH INSURANCE | Admitting: Physician Assistant

## 2018-10-09 ENCOUNTER — Other Ambulatory Visit: Payer: Self-pay

## 2018-10-09 ENCOUNTER — Ambulatory Visit (INDEPENDENT_AMBULATORY_CARE_PROVIDER_SITE_OTHER): Payer: PRIVATE HEALTH INSURANCE | Admitting: Physician Assistant

## 2018-10-09 VITALS — BP 120/82 | HR 79 | Temp 98.0°F | Resp 14 | Ht 63.0 in | Wt 208.0 lb

## 2018-10-09 DIAGNOSIS — H1013 Acute atopic conjunctivitis, bilateral: Secondary | ICD-10-CM | POA: Diagnosis not present

## 2018-10-09 DIAGNOSIS — H66001 Acute suppurative otitis media without spontaneous rupture of ear drum, right ear: Secondary | ICD-10-CM

## 2018-10-09 MED ORDER — AZELASTINE HCL 0.05 % OP SOLN: 1.0000 [drp] | Freq: Two times a day (BID) | OPHTHALMIC | 12 refills | Status: DC

## 2018-10-09 MED ORDER — AMOXICILLIN 500 MG PO CAPS: 500.0000 mg | ORAL_CAPSULE | Freq: Two times a day (BID) | ORAL | 0 refills | Status: DC

## 2018-10-09 NOTE — Progress Notes (Signed)
Patient presents to clinic today c/o 3 days of R ear pressure and pain with mild tinnitus. Denies nasal congestion, fever, chills, cough. Denies drainage from R ear. Denies symptoms of L ear. Has noticed pink and itchy eyes with excess watering x 2 days. Denies vision changes or injury to the eye. Denies foreign body sensation..   Past Medical History:  Diagnosis Date  . Anxiety   . CIN I (cervical intraepithelial neoplasia I) 1989   cryo...also in 2008 .. had cryo   . GERD (gastroesophageal reflux disease)   . Hyperlipidemia   . Hypertension     Current Outpatient Medications on File Prior to Visit  Medication Sig Dispense Refill  . atorvastatin (LIPITOR) 20 MG tablet Take 1 tablet (20 mg total) by mouth daily. No further refills without follow-up. 30 tablet 0  . estradiol (CLIMARA - DOSED IN MG/24 HR) 0.1 mg/24hr patch PLACE 1 PATCH (0.1 MG TOTAL) ONTO THE SKIN ONCE A WEEK. 12 patch 3  . hydrochlorothiazide (HYDRODIURIL) 12.5 MG tablet TAKE 1 TABLET BY MOUTH EVERY DAY 30 tablet 3  . meclizine (ANTIVERT) 25 MG tablet Take 1 tablet (25 mg total) by mouth 3 (three) times daily as needed for dizziness. 30 tablet 0  . Multiple Vitamins-Minerals (WOMENS MULTIVITAMIN PLUS) TABS Take by mouth daily.    Marland Kitchen KLOR-CON M20 20 MEQ tablet TAKE 1 TABLET (20 MEQ TOTAL) BY MOUTH DAILY. (Patient not taking: Reported on 01/23/2018) 90 tablet 0   No current facility-administered medications on file prior to visit.     No Known Allergies  Family History  Problem Relation Age of Onset  . Diabetes Brother   . Ovarian cancer Mother   . Ovarian cancer Paternal Aunt   . Heart disease Maternal Grandmother   . Breast cancer Maternal Grandmother        Age 61's  . Breast cancer Maternal Aunt        Age 73's    Social History   Socioeconomic History  . Marital status: Married    Spouse name: Not on file  . Number of children: Not on file  . Years of education: Not on file  . Highest education  level: Not on file  Occupational History  . Not on file  Social Needs  . Financial resource strain: Not on file  . Food insecurity:    Worry: Not on file    Inability: Not on file  . Transportation needs:    Medical: Not on file    Non-medical: Not on file  Tobacco Use  . Smoking status: Never Smoker  . Smokeless tobacco: Never Used  Substance and Sexual Activity  . Alcohol use: No    Alcohol/week: 0.0 standard drinks  . Drug use: No  . Sexual activity: Yes    Partners: Male    Birth control/protection: Surgical    Comment: 1st intercourse- 19, partners- 12, married- 51 yrs   Lifestyle  . Physical activity:    Days per week: Not on file    Minutes per session: Not on file  . Stress: Not on file  Relationships  . Social connections:    Talks on phone: Not on file    Gets together: Not on file    Attends religious service: Not on file    Active member of club or organization: Not on file    Attends meetings of clubs or organizations: Not on file    Relationship status: Not on file  Other Topics Concern  .  Not on file  Social History Narrative  . Not on file   Review of Systems - See HPI.  All other ROS are negative.  BP 120/82   Pulse 79   Temp 98 F (36.7 C) (Oral)   Resp 14   Ht 5\' 3"  (1.6 m)   Wt 208 lb (94.3 kg)   LMP 11/10/2011   SpO2 99%   BMI 36.85 kg/m   Physical Exam Constitutional:      Appearance: Normal appearance.  HENT:     Head: Normocephalic and atraumatic.     Right Ear: Ear canal and external ear normal.     Left Ear: Ear canal and external ear normal.     Ears:     Comments: R TM erythematous and bulging with yellow fluid noted behind TM    Nose: No congestion or rhinorrhea.  Eyes:     General: Lids are normal.        Right eye: No discharge.        Left eye: No discharge.     Extraocular Movements: Extraocular movements intact.     Conjunctiva/sclera:     Right eye: Right conjunctiva is injected. No hemorrhage.    Left eye: Left  conjunctiva is injected. No hemorrhage.    Pupils: Pupils are equal, round, and reactive to light.  Neck:     Musculoskeletal: Normal range of motion and neck supple.  Cardiovascular:     Rate and Rhythm: Normal rate and regular rhythm.  Pulmonary:     Effort: Pulmonary effort is normal.     Breath sounds: Normal breath sounds.  Neurological:     Mental Status: She is alert.     Assessment/Plan: 1. Allergic conjunctivitis of both eyes No sign of bacterial conjunctivitis on examination. Supportive measures reviewed. Rx Optivar solution. Follow-up if not improving.  - azelastine (OPTIVAR) 0.05 % ophthalmic solution; Place 1 drop into both eyes 2 (two) times daily.  Dispense: 6 mL; Refill: 12  2. Non-recurrent acute suppurative otitis media of right ear without spontaneous rupture of tympanic membrane Start AMoxicillin x 10 days. Supportive measures and OTC medications reviewed.  - amoxicillin (AMOXIL) 500 MG capsule; Take 1 capsule (500 mg total) by mouth 2 (two) times daily.  Dispense: 20 capsule; Refill: 0   Leeanne Rio, PA-C

## 2018-10-09 NOTE — Patient Instructions (Signed)
Keep well-hydrated and get plenty of rest.  Take the antibiotic as directed with food. Start an over-the-counter Flonase spray daily.   Apply the Optivar eye drops to the eyes as directed. Apply warm compresses.  Call me if symptoms are not resolving or if new symptoms develop.

## 2018-11-04 ENCOUNTER — Other Ambulatory Visit: Payer: Self-pay | Admitting: Physician Assistant

## 2018-11-04 ENCOUNTER — Encounter: Payer: Self-pay | Admitting: Physician Assistant

## 2018-11-04 ENCOUNTER — Ambulatory Visit: Payer: No Typology Code available for payment source | Admitting: Physician Assistant

## 2018-11-04 ENCOUNTER — Encounter: Payer: Self-pay | Admitting: Emergency Medicine

## 2018-11-04 ENCOUNTER — Other Ambulatory Visit: Payer: Self-pay

## 2018-11-04 VITALS — BP 124/82 | HR 99 | Temp 98.1°F | Resp 16 | Ht 63.0 in | Wt 209.0 lb

## 2018-11-04 DIAGNOSIS — N750 Cyst of Bartholin's gland: Secondary | ICD-10-CM

## 2018-11-04 MED ORDER — SULFAMETHOXAZOLE-TRIMETHOPRIM 800-160 MG PO TABS: 1.0000 | ORAL_TABLET | Freq: Two times a day (BID) | ORAL | 0 refills | Status: DC

## 2018-11-04 NOTE — Telephone Encounter (Signed)
Patient has appointment today. Will check labs to verify medication.

## 2018-11-04 NOTE — Progress Notes (Signed)
Patient presents to clinic today c/o 5 days of a bump adjacent to her left labia that is tender. Notes pain with sitting, coughing or sneezing. Denies any drainage from lesion. Denies vaginal discharge, pain or change to bladder habits. Denies fever, chills, nausea/vomiting.   Past Medical History:  Diagnosis Date  . Anxiety   . CIN I (cervical intraepithelial neoplasia I) 1989   cryo...also in 2008 .. had cryo   . GERD (gastroesophageal reflux disease)   . Hyperlipidemia   . Hypertension     Current Outpatient Medications on File Prior to Visit  Medication Sig Dispense Refill  . atorvastatin (LIPITOR) 20 MG tablet Take 1 tablet (20 mg total) by mouth daily. No further refills without follow-up. 30 tablet 0  . azelastine (OPTIVAR) 0.05 % ophthalmic solution Place 1 drop into both eyes 2 (two) times daily. 6 mL 12  . estradiol (CLIMARA - DOSED IN MG/24 HR) 0.1 mg/24hr patch PLACE 1 PATCH (0.1 MG TOTAL) ONTO THE SKIN ONCE A WEEK. 12 patch 3  . hydrochlorothiazide (HYDRODIURIL) 12.5 MG tablet TAKE 1 TABLET BY MOUTH EVERY DAY 30 tablet 3  . meclizine (ANTIVERT) 25 MG tablet Take 1 tablet (25 mg total) by mouth 3 (three) times daily as needed for dizziness. 30 tablet 0  . Multiple Vitamins-Minerals (WOMENS MULTIVITAMIN PLUS) TABS Take by mouth daily.     No current facility-administered medications on file prior to visit.     No Known Allergies  Family History  Problem Relation Age of Onset  . Diabetes Brother   . Ovarian cancer Mother   . Ovarian cancer Paternal Aunt   . Heart disease Maternal Grandmother   . Breast cancer Maternal Grandmother        Age 7's  . Breast cancer Maternal Aunt        Age 102's    Social History   Socioeconomic History  . Marital status: Married    Spouse name: Not on file  . Number of children: Not on file  . Years of education: Not on file  . Highest education level: Not on file  Occupational History  . Not on file  Social Needs  .  Financial resource strain: Not on file  . Food insecurity:    Worry: Not on file    Inability: Not on file  . Transportation needs:    Medical: Not on file    Non-medical: Not on file  Tobacco Use  . Smoking status: Never Smoker  . Smokeless tobacco: Never Used  Substance and Sexual Activity  . Alcohol use: No    Alcohol/week: 0.0 standard drinks  . Drug use: No  . Sexual activity: Yes    Partners: Male    Birth control/protection: Surgical    Comment: 1st intercourse- 19, partners- 60, married- 18 yrs   Lifestyle  . Physical activity:    Days per week: Not on file    Minutes per session: Not on file  . Stress: Not on file  Relationships  . Social connections:    Talks on phone: Not on file    Gets together: Not on file    Attends religious service: Not on file    Active member of club or organization: Not on file    Attends meetings of clubs or organizations: Not on file    Relationship status: Not on file  Other Topics Concern  . Not on file  Social History Narrative  . Not on file   Review  of Systems - See HPI.  All other ROS are negative.  BP 124/82   Pulse 99   Temp 98.1 F (36.7 C) (Oral)   Resp 16   Ht 5\' 3"  (1.6 m)   Wt 209 lb (94.8 kg)   LMP 11/10/2011   SpO2 99%   BMI 37.02 kg/m   Physical Exam Exam conducted with a chaperone present.  Constitutional:      Appearance: Normal appearance.  HENT:     Head: Normocephalic and atraumatic.  Cardiovascular:     Rate and Rhythm: Normal rate and regular rhythm.  Genitourinary:    General: Normal vulva.     Labia:        Right: No lesion or injury.        Left: Tenderness present. No rash, lesion or injury.      Comments: Pain and swelling of L bartholin gland noted on exam, about 8 mm area of this. No noted fluctuance.  Neurological:     Mental Status: She is alert.      Assessment/Plan: 1. Bartholin gland cyst inflamed with possible infection. No fluctuance to suggest abscess. Will start Bactrim  DS BID x 7 days. Supportive measures and OTC medications reviewed. Strict return precautions discussed with patient Low threshold for GYN assessment. - sulfamethoxazole-trimethoprim (BACTRIM DS,SEPTRA DS) 800-160 MG tablet; Take 1 tablet by mouth 2 (two) times daily.  Dispense: 14 tablet; Refill: 0   Leeanne Rio, PA-C

## 2018-11-04 NOTE — Patient Instructions (Signed)
Please try warm bath to help with pain and to help promote drainage. Take the antibiotic as directed. Start a daily probiotic.  If not improving within 48-72 hours or if anything worsens, we will need assessment with Gynecology. Call me immediately if anything worsens.

## 2018-11-10 ENCOUNTER — Encounter: Payer: Self-pay | Admitting: Obstetrics & Gynecology

## 2018-11-10 ENCOUNTER — Ambulatory Visit (INDEPENDENT_AMBULATORY_CARE_PROVIDER_SITE_OTHER): Payer: PRIVATE HEALTH INSURANCE | Admitting: Obstetrics & Gynecology

## 2018-11-10 VITALS — BP 134/78

## 2018-11-10 DIAGNOSIS — N751 Abscess of Bartholin's gland: Secondary | ICD-10-CM | POA: Diagnosis not present

## 2018-11-10 DIAGNOSIS — Z7189 Other specified counseling: Secondary | ICD-10-CM

## 2018-11-10 DIAGNOSIS — N750 Cyst of Bartholin's gland: Secondary | ICD-10-CM | POA: Diagnosis not present

## 2018-11-10 MED ORDER — SULFAMETHOXAZOLE-TRIMETHOPRIM 800-160 MG PO TABS: 1.0000 | ORAL_TABLET | Freq: Two times a day (BID) | ORAL | 0 refills | Status: AC

## 2018-11-10 NOTE — Patient Instructions (Signed)
1. Left Bartholin's gland abscess Recurrent left Bartholin gland abscess.  Previous incisions and drainages as well as Word catheter x1.  Counseling done on best treatment for recurrent Bartholin gland abscesses.  Patient offered to organize a marsupialization of the left Bartholin gland.  Procedure risks and benefits thoroughly reviewed with patient.  But patient wanted incision and drainage today because of the severe pain.  Understands that it is likely to recur and at that time agrees to organize a marsupialization.  Will extend Bactrim treatment to a total of 14 days.  Will continue to do warm soaking.  Order - sulfamethoxazole-trimethoprim (BACTRIM DS,SEPTRA DS) 800-160 MG tablet; Take 1 tablet by mouth 2 (two) times daily for 7 days. Extend Bactrim treatment to a total of 14 days.  Madeline Hawkins, it was a pleasure seeing you today!

## 2018-11-10 NOTE — Progress Notes (Signed)
    Madeline Hawkins [birth year] [id number]        52 y.o.  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 Drained many times in the past and a Word catheter put in place once.  Seen by her Fam MD 11/04/18 and started on Bactrim.  Warm soaking done daily, but no spontaneous drainage so far.  Very tender and painful, when sitting or walking.  No fever.   OB History  Gravida Para Term Preterm AB Living  5 4 4   1 4   SAB TAB Ectopic Multiple Live Births  0   1   4    # Outcome Date GA Lbr Len/2nd Weight Sex Delivery Anes PTL Lv  5 Ectopic           4 Term     F Vag-Spont  N LIV  3 Term     M Vag-Spont  N LIV  2 Term     F Vag-Spont  N LIV  1 Term     F Vag-Spont  N LIV    Past medical history,surgical history, problem list, medications, allergies, family history and social history were all reviewed and documented in the EPIC chart.   Directed ROS with pertinent positives and negatives documented in the history of present illness/assessment and plan.  Exam:  Vitals:   11/10/18 1440  BP: 134/78   General appearance:  Normal   Gynecologic exam: Vulva:  Left Bartholin Gland abscess 3 x 3 cm, very tender.  No drainage.  No cellulitis.    Verbal consent for I & D today:  Betadine prep.  Local anesthesia with Lidocaine 1%.  Incision with scalpel.  Pus drained +++.  2nd pocket incised with additional drainage of pus.  Silver Nitrate at small skin incision for hemostasis.     Assessment/Plan:  53 y.o. [id number]   1. Left Bartholin's gland abscess Recurrent left Bartholin gland abscess.  Previous incisions and drainages as well as Word catheter x1.  Counseling done on best treatment for recurrent Bartholin gland abscesses.  Patient offered to organize a marsupialization of the left Bartholin gland.  Procedure risks and benefits thoroughly reviewed with patient.  But patient wanted incision and drainage today because of the severe pain.   Understands that it is likely to recur and at that time agrees to organize a marsupialization.  Will extend Bactrim treatment to a total of 14 days.  Will continue to do warm soaking.  Order - sulfamethoxazole-trimethoprim (BACTRIM DS,SEPTRA DS) 800-160 MG tablet; Take 1 tablet by mouth 2 (two) times daily for 7 days. Extend Bactrim treatment to a total of 14 days.  Counseling on above issues and coordination of care more than 50% for 25 minutes.  Princess Bruins MD, 2:51 PM 11/10/2018

## 2018-11-18 ENCOUNTER — Other Ambulatory Visit: Payer: Self-pay | Admitting: Obstetrics & Gynecology

## 2018-11-27 ENCOUNTER — Telehealth: Payer: Self-pay

## 2018-11-27 NOTE — Telephone Encounter (Signed)
Left Bartholin gland Marsupialization at [facility] on Monday if possible  30 minutes  Please have her show me tomorrow

## 2018-11-27 NOTE — Telephone Encounter (Signed)
Patient saw you on 11/10/2018 and she said she is calling to schedule procedure because you told her that if it recurred that was the thing to do and it has come back.  pls advise surgery request.

## 2018-11-28 ENCOUNTER — Encounter (HOSPITAL_BASED_OUTPATIENT_CLINIC_OR_DEPARTMENT_OTHER): Payer: Self-pay | Admitting: *Deleted

## 2018-11-28 ENCOUNTER — Other Ambulatory Visit: Payer: Self-pay

## 2018-11-28 NOTE — Progress Notes (Addendum)
Spoke with Camesha Npo after midnight, arrive 530 am [facility] 12-02-2018 meds to take sip of water: atorvastatin, optivar eye drop, estrodial patch ekg and chest xray done 03-31-18 epic/chart Driver spouse randall or daughter Tillie [place] Requested surgery orders with [employer] [facility]

## 2018-11-28 NOTE — Telephone Encounter (Signed)
Excellent, thanks. 

## 2018-11-28 NOTE — Telephone Encounter (Signed)
No OR time available for Monday. I spoke with patient about ins benefits and estimated surgery prepymt due prior to surgery.  I scheduled her for Tuesday 2/4 at 7:30am at [facility].  She will be here Monday 2/3 at 4:30pm for you to recheck her. Does that sound okay?

## 2018-11-30 ENCOUNTER — Other Ambulatory Visit: Payer: Self-pay | Admitting: Physician Assistant

## 2018-12-01 ENCOUNTER — Encounter: Payer: Self-pay | Admitting: Anesthesiology

## 2018-12-01 ENCOUNTER — Ambulatory Visit: Payer: PRIVATE HEALTH INSURANCE | Admitting: Obstetrics & Gynecology

## 2018-12-01 ENCOUNTER — Encounter: Payer: Self-pay | Admitting: Obstetrics & Gynecology

## 2018-12-01 VITALS — BP 130/86

## 2018-12-01 DIAGNOSIS — N750 Cyst of Bartholin's gland: Secondary | ICD-10-CM | POA: Diagnosis not present

## 2018-12-01 NOTE — Patient Instructions (Signed)
1. 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  We will proceed with a left Bartholin gland marsupialization.  Surgery schedule December 02, 2018.  Procedure and risks reviewed with patient.  Patient voiced understanding and agreement with plan.  Patient will be fasting for 6 hours prior to the procedure.  Postop precautions and management of packing discussed with patient.  Will remove packing 24 hours after procedure.  Shady, it was a pleasure seeing you today!

## 2018-12-01 NOTE — Progress Notes (Signed)
    Madeline Hawkins 02-09-1966 [id number]        53 y.o.  [id number]   RP: Recurrence of left Bartholin gland cyst  HPI: Many recurrences of Lt Bartholin gland cysts and abscesses.  Treated in the past by I&D or with the Word catheter.  Last I&D was 11/10/2018.  Mild vulvar pain currently.  Feels a mildly tender bump on the left vulva.  No drainage.   OB History  Gravida Para Term Preterm AB Living  5 4 4   1 4   SAB TAB Ectopic Multiple Live Births  0   1   4    # Outcome Date GA Lbr Len/2nd Weight Sex Delivery Anes PTL Lv  5 Ectopic           4 Term     F Vag-Spont  N LIV  3 Term     M Vag-Spont  N LIV  2 Term     F Vag-Spont  N LIV  1 Term     F Vag-Spont  N LIV    Past medical history,surgical history, problem list, medications, allergies, family history and social history were all reviewed and documented in the EPIC chart.   Directed ROS with pertinent positives and negatives documented in the history of present illness/assessment and plan.  Exam:  Vitals:   12/01/18 1639  BP: 130/86   General appearance:  Normal  Gynecologic exam: Left vulva with a mildly tender Bartholin gland cyst about 3 cm in diameter.  No erythema, no drainage.   Assessment/Plan:  53 y.o. [id number]   1. 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  We will proceed with a left Bartholin gland marsupialization.  Surgery schedule December 02, 2018.  Procedure and risks reviewed with patient.  Patient voiced understanding and agreement with plan.  Patient will be fasting for 6 hours prior to the procedure.  Postop precautions and management of packing discussed with patient.  Will remove packing 24 hours after procedure.  Counseling on above issues and coordination of care more than 50% for 15 minutes.  Princess Bruins MD, 4:42 PM 12/01/2018

## 2018-12-01 NOTE — Anesthesia Preprocedure Evaluation (Addendum)
Anesthesia Evaluation  Patient identified by MRN, date of birth, ID band Patient awake    Reviewed: Allergy & Precautions, NPO status , Patient's Chart, lab work & pertinent test results  Airway Mallampati: II  TM Distance: >3 FB Neck ROM: Full    Dental no notable dental hx. (+) Teeth Intact, Dental Advisory Given   Pulmonary neg pulmonary ROS,    Pulmonary exam normal breath sounds clear to auscultation       Cardiovascular hypertension, Pt. on medications negative cardio ROS Normal cardiovascular exam Rhythm:Regular Rate:Normal     Neuro/Psych PSYCHIATRIC DISORDERS Anxiety negative neurological ROS     GI/Hepatic Neg liver ROS, GERD  Medicated and Controlled,  Endo/Other  negative endocrine ROS  Renal/GU negative Renal ROS  negative genitourinary   Musculoskeletal negative musculoskeletal ROS (+)   Abdominal   Peds  Hematology negative hematology ROS (+)   Anesthesia Other Findings Bartholin abscess  Reproductive/Obstetrics                           Anesthesia Physical Anesthesia Plan  ASA: II  Anesthesia Plan: MAC   Post-op Pain Management:    Induction: Intravenous  PONV Risk Score and Plan: 2 and Midazolam, Dexamethasone and Ondansetron  Airway Management Planned: Natural Airway  Additional Equipment:   Intra-op Plan:   Post-operative Plan: Extubation in OR  Informed Consent: I have reviewed the patients History and Physical, chart, labs and discussed the procedure including the risks, benefits and alternatives for the proposed anesthesia with the patient or authorized representative who has indicated his/her understanding and acceptance.     Dental advisory given  Plan Discussed with: CRNA  Anesthesia Plan Comments:       Anesthesia Quick Evaluation

## 2018-12-02 ENCOUNTER — Ambulatory Visit (HOSPITAL_BASED_OUTPATIENT_CLINIC_OR_DEPARTMENT_OTHER)
Admission: RE | Admit: 2018-12-02 | Discharge: 2018-12-02 | Disposition: A | Discharge status: Home / Self Care | Payer: PRIVATE HEALTH INSURANCE | Attending: Obstetrics & Gynecology | Admitting: Obstetrics & Gynecology

## 2018-12-02 ENCOUNTER — Encounter (HOSPITAL_BASED_OUTPATIENT_CLINIC_OR_DEPARTMENT_OTHER): Payer: Self-pay | Admitting: Anesthesiology

## 2018-12-02 ENCOUNTER — Ambulatory Visit (HOSPITAL_BASED_OUTPATIENT_CLINIC_OR_DEPARTMENT_OTHER): Payer: PRIVATE HEALTH INSURANCE | Admitting: Anesthesiology

## 2018-12-02 ENCOUNTER — Other Ambulatory Visit: Payer: Self-pay

## 2018-12-02 ENCOUNTER — Encounter (HOSPITAL_BASED_OUTPATIENT_CLINIC_OR_DEPARTMENT_OTHER)
Admission: RE | Disposition: A | Discharge status: Home / Self Care | Payer: Self-pay | Source: Home / Self Care | Attending: Obstetrics & Gynecology

## 2018-12-02 DIAGNOSIS — N750 Cyst of Bartholin's gland: Secondary | ICD-10-CM | POA: Insufficient documentation

## 2018-12-02 DIAGNOSIS — Z90710 Acquired absence of both cervix and uterus: Secondary | ICD-10-CM | POA: Insufficient documentation

## 2018-12-02 DIAGNOSIS — I10 Essential (primary) hypertension: Secondary | ICD-10-CM | POA: Insufficient documentation

## 2018-12-02 DIAGNOSIS — Z79890 Hormone replacement therapy (postmenopausal): Secondary | ICD-10-CM | POA: Insufficient documentation

## 2018-12-02 DIAGNOSIS — E785 Hyperlipidemia, unspecified: Secondary | ICD-10-CM | POA: Diagnosis not present

## 2018-12-02 DIAGNOSIS — Z79899 Other long term (current) drug therapy: Secondary | ICD-10-CM | POA: Insufficient documentation

## 2018-12-02 DIAGNOSIS — N751 Abscess of Bartholin's gland: Secondary | ICD-10-CM

## 2018-12-02 HISTORY — PX: BARTHOLIN CYST MARSUPIALIZATION: SHX5383

## 2018-12-02 HISTORY — DX: Cyst of Bartholin's gland: N75.0

## 2018-12-02 LAB — BASIC METABOLIC PANEL
Anion gap: 9 (ref 5–15)
BUN: 14 mg/dL (ref 6–20)
CALCIUM: 9.1 mg/dL (ref 8.9–10.3)
CO2: 25 mmol/L (ref 22–32)
Chloride: 107 mmol/L (ref 98–111)
Creatinine, Ser: 0.73 mg/dL (ref 0.44–1.00)
GFR calc Af Amer: >60 mL/min (ref >60)
GFR calc non Af Amer: >60 mL/min (ref >60)
GLUCOSE: 99 mg/dL (ref 70–99)
Potassium: 3.8 mmol/L (ref 3.5–5.1)
Sodium: 141 mmol/L (ref 135–145)

## 2018-12-02 SURGERY — MARSUPIALIZATION, CYST, BARTHOLIN'S GLAND
Anesthesia: Monitor Anesthesia Care | Laterality: Left

## 2018-12-02 MED ORDER — MIDAZOLAM HCL 5 MG/5ML IJ SOLN
INTRAMUSCULAR | Status: DC | PRN
  Administered 2018-12-02: 2 mg via INTRAVENOUS

## 2018-12-02 MED ORDER — FENTANYL CITRATE (PF) 100 MCG/2ML IJ SOLN
25.0000 ug | INTRAMUSCULAR | Status: DC | PRN
  Administered 2018-12-02: 50 ug via INTRAVENOUS
  Filled 2018-12-02: qty 1

## 2018-12-02 MED ORDER — ONDANSETRON HCL 4 MG/2ML IJ SOLN
INTRAMUSCULAR | Status: AC
  Filled 2018-12-02: qty 2

## 2018-12-02 MED ORDER — KETOROLAC TROMETHAMINE 30 MG/ML IJ SOLN
INTRAMUSCULAR | Status: AC
  Filled 2018-12-02: qty 1

## 2018-12-02 MED ORDER — PROPOFOL 500 MG/50ML IV EMUL
INTRAVENOUS | Status: AC
  Filled 2018-12-02: qty 50

## 2018-12-02 MED ORDER — PROPOFOL 500 MG/50ML IV EMUL
INTRAVENOUS | Status: DC | PRN
  Administered 2018-12-02: 25 ug/kg/min via INTRAVENOUS

## 2018-12-02 MED ORDER — LIDOCAINE 2% (20 MG/ML) 5 ML SYRINGE
INTRAMUSCULAR | Status: DC | PRN
  Administered 2018-12-02: 60 mg via INTRAVENOUS

## 2018-12-02 MED ORDER — MIDAZOLAM HCL 2 MG/2ML IJ SOLN
INTRAMUSCULAR | Status: AC
  Filled 2018-12-02: qty 2

## 2018-12-02 MED ORDER — PROPOFOL 10 MG/ML IV BOLUS
INTRAVENOUS | Status: DC | PRN
  Administered 2018-12-02: 20 mg via INTRAVENOUS
  Administered 2018-12-02: 40 mg via INTRAVENOUS

## 2018-12-02 MED ORDER — LIDOCAINE HCL (PF) 1 % IJ SOLN
INTRAMUSCULAR | Status: DC | PRN
  Administered 2018-12-02: 7 mL

## 2018-12-02 MED ORDER — ACETAMINOPHEN 500 MG PO TABS
1000.0000 mg | ORAL_TABLET | Freq: Once | ORAL | Status: AC
  Administered 2018-12-02: 1000 mg via ORAL
  Filled 2018-12-02: qty 2

## 2018-12-02 MED ORDER — LACTATED RINGERS IV SOLN
INTRAVENOUS | Status: DC
  Administered 2018-12-02 (×2): via INTRAVENOUS
  Filled 2018-12-02: qty 1000

## 2018-12-02 MED ORDER — LIDOCAINE 2% (20 MG/ML) 5 ML SYRINGE
INTRAMUSCULAR | Status: AC
  Filled 2018-12-02: qty 5

## 2018-12-02 MED ORDER — ONDANSETRON HCL 4 MG/2ML IJ SOLN
INTRAMUSCULAR | Status: DC | PRN
  Administered 2018-12-02: 4 mg via INTRAVENOUS

## 2018-12-02 MED ORDER — FENTANYL CITRATE (PF) 100 MCG/2ML IJ SOLN
INTRAMUSCULAR | Status: AC
  Filled 2018-12-02: qty 2

## 2018-12-02 MED ORDER — FENTANYL CITRATE (PF) 100 MCG/2ML IJ SOLN
INTRAMUSCULAR | Status: DC | PRN
  Administered 2018-12-02 (×2): 50 ug via INTRAVENOUS

## 2018-12-02 MED ORDER — ACETAMINOPHEN 500 MG PO TABS
ORAL_TABLET | ORAL | Status: AC
  Filled 2018-12-02: qty 2

## 2018-12-02 MED ORDER — KETOROLAC TROMETHAMINE 30 MG/ML IJ SOLN
INTRAMUSCULAR | Status: DC | PRN
  Administered 2018-12-02: 30 mg via INTRAVENOUS

## 2018-12-02 SURGICAL SUPPLY — 20 items
BLADE SURG 15 STRL LF DISP TIS (BLADE) ×1 IMPLANT
BLADE SURG 15 STRL SS (BLADE) ×3
CATH ROBINSON RED A/P 16FR (CATHETERS) ×3 IMPLANT
COVER WAND RF STERILE (DRAPES) ×3 IMPLANT
GAUZE PACKING 1/2X5YD (GAUZE/BANDAGES/DRESSINGS) ×4 IMPLANT
GLOVE BIO SURGEON STRL SZ 6.5 (GLOVE) ×3 IMPLANT
GLOVE BIO SURGEONS STRL SZ 6.5 (GLOVE) ×2
GLOVE BIOGEL PI IND STRL 7.0 (GLOVE) ×2 IMPLANT
GLOVE BIOGEL PI INDICATOR 7.0 (GLOVE) ×4
GOWN STRL REUS W/TWL LRG LVL3 (GOWN DISPOSABLE) ×6 IMPLANT
NEEDLE HYPO 22GX1.5 SAFETY (NEEDLE) ×3 IMPLANT
PACK VAGINAL WOMENS (CUSTOM PROCEDURE TRAY) ×1 IMPLANT
PAD OB MATERNITY 4.3X12.25 (PERSONAL CARE ITEMS) ×3 IMPLANT
PAD PREP 24X48 CUFFED NSTRL (MISCELLANEOUS) ×3 IMPLANT
SUT VIC AB 3-0 SH 27 (SUTURE) ×9
SUT VIC AB 3-0 SH 27X BRD (SUTURE) IMPLANT
SUT VICRYL RAPIDE 4/0 PS 2 (SUTURE) ×4 IMPLANT
SWAB COLLECTION DEVICE MRSA (MISCELLANEOUS) IMPLANT
SWAB CULTURE ESWAB REG 1ML (MISCELLANEOUS) IMPLANT
TOWEL OR 17X24 6PK STRL BLUE (TOWEL DISPOSABLE) ×6 IMPLANT

## 2018-12-02 NOTE — Discharge Instructions (Addendum)
Bartholin's Cyst or Abscess  A Bartholin's cyst is a fluid-filled sac that forms on a Bartholin's gland. Bartholin's glands are small glands in the folds of skin around the vaginal opening (labia). These glands produce a fluid to moisten (lubricate) the outside of the vagina during sex. A cyst that is not large or infected may not cause any problems or require treatment. If the cyst gets infected, it is called a Bartholin's abscess. An abscess may cause symptoms such as pain and swelling and is more likely to require treatment. What are the causes? This condition may be caused by a blocked Bartholin's gland. These glands can become blocked due to natural buildup of fluid and oils. Bacteria inside of the cyst can cause infection. In many cases, the cause is not known. What increases the risk? You may be at increased risk of developing a Bartholin's cyst or abscess if:  You are of childbearing age.  You have a history of Bartholin's cysts or abscesses.  You have diabetes.  You have an STI (sexually transmitted infection). What are the signs or symptoms? Symptoms may include:  A bulge or lump on the labia, near the lower opening of the vagina.  Discomfort or pain. This may get worse during sex or when walking.  Redness, swelling, or fluid draining from the area. These may be signs of an abscess. How severe your symptoms are depends on the size of your cyst and whether it is infected. Infection causes symptoms to get more severe. How is this diagnosed? This condition may be diagnosed based on:  Your symptoms and medical history.  A physical exam to check for swelling in your vaginal area. You may lie on your back on an exam table and have your feet placed into footrests for the exam.  Blood tests to check for infections.  Removal of a fluid sample from the cyst or abscess (biopsy) for testing. You may work with a health care provider who specializes in [employer] health (gynecologist)  for diagnosis and treatment. How is this treated? If your cyst is small, not infected, and not causing symptoms, you may not need any treatment. These cysts often go away on their own, with home care such as hot baths or warm compresses. If you have a large cyst or an abscess, treatment may include:  Antibiotic medicine.  A procedure to drain the fluid inside the cyst or abscess. These procedures involve making an incision in the cyst or abscess so that the fluid drains out, and then one of the following may be done: ? A small, thin tube (catheter) may be placed inside the cyst or abscess so that it does not close and fill up with fluid again (fistulization). The catheter will be removed at a follow-up visit. ? The edges of the incision may be stitched to your skin so that the cyst or abscess stays open (marsupialization). This allows it to continue to drain and not fill up with fluid again. If you have cysts or abscesses that keep returning (recurring) and have required incision and drainage multiple times, your health care provider may talk with you about surgery to remove the Bartholin's gland. Follow these instructions at home: Medicines  Take over-the-counter and prescription medicines only as told by your health care provider.  If you were prescribed an antibiotic medicine, take it as told by your health care provider. Do not stop taking the antibiotic even if your condition improves. Managing pain and swelling  Try sitz baths to  help with pain and swelling. A sitz bath is a warm water bath in which the water only comes up to your hips and should cover your buttocks. You may take sitz baths several times a day.  Apply heat to the affected area as often as needed. Use the heat source that your health care provider recommends, such as a moist heat pack or a heating pad. ? Place a towel between your skin and the heat source. ? Leave the heat on for 20-30 minutes. ? Remove the heat if your  skin turns bright red. This is especially important if you are unable to feel pain, heat, or cold. You may have a greater risk of getting burned. General instructions  If your cyst or abscess was drained, follow instructions from your health care provider about how to take care of your wound. Use feminine pads as needed to absorb any drainage.  Do not push on or squeeze your cyst.  Do not have sex until the cyst has gone away or your wound from drainage has healed.  Take these steps to help prevent a Bartholin's cyst from returning, and to prevent other Bartholin's cysts from developing: ? Take a bath or shower once a day. Clean your vaginal area with mild soap and water when you bathe. ? Practice safe sex to prevent STIs. Talk with your health care provider about how to prevent STIs and which forms of birth control (contraception) may be best for you.  Keep all follow-up visits as told by your health care provider. This is important. Contact a health care provider if:  You have a fever.  You develop redness, swelling, or pain around your cyst.  You have fluid, blood, pus, or a bad smell coming from your cyst.  You have a cyst that gets larger or comes back. Summary  A Bartholin's cyst is a fluid-filled sac that forms on a Bartholin's gland. These glands are in the folds of skin around the vaginal opening (labia).  If your cyst is small, not infected, and not causing symptoms, you may not need any treatment. These cysts often go away on their own, with home care such as hot baths or warm compresses.  If you have a large cyst or an abscess, your health care provider may perform a procedure to drain the fluid.  If you have cysts or abscesses that keep returning (recurring) and have required incision and drainage multiple times, your health care provider may talk with you about surgery to remove the Bartholin's gland. This information is not intended to replace advice given to you by  your health care provider. Make sure you discuss any questions you have with your health care provider. Document Released: 10/15/2005 Document Revised: 07/17/2017 Document Reviewed: 07/17/2017 Elsevier Interactive Patient Education  2019 [employer].  Call your surgeon if you experience:   1.  Fever over 101.0. 2.  Inability to urinate. 3.  Nausea and/or vomiting. 4.  Extreme swelling or bruising at the surgical site. 5.  Continued bleeding from the incision. 6.  Increased pain, redness or drainage from the incision. 7.  Problems related to your pain medication. 8.  Any problems and/or concerns Post Anesthesia Home Care Instructions  Activity: Get plenty of rest for the remainder of the day. A responsible individual must stay with you for 24 hours following the procedure.  For the next 24 hours, DO NOT: -Drive a car -Paediatric nurse -Drink alcoholic beverages -Take any medication unless instructed by your physician -  Make any legal decisions or sign important papers.  Meals: Start with liquid foods such as gelatin or soup. Progress to regular foods as tolerated. Avoid greasy, spicy, heavy foods. If nausea and/or vomiting occur, drink only clear liquids until the nausea and/or vomiting subsides. Call your physician if vomiting continues.  Special Instructions/Symptoms: Your throat may feel dry or sore from the anesthesia or the breathing tube placed in your throat during surgery. If this causes discomfort, gargle with warm salt water. The discomfort should disappear within 24 hours.  May take Ibuprofen, Advil, Motrin, or Aleve after 2 PM.

## 2018-12-02 NOTE — Transfer of Care (Signed)
Immediate Anesthesia Transfer of Care Note  Patient: Madeline Hawkins  Procedure(s) Performed: BARTHOLIN MARSUPIALIZATION OF LEFT GLAND (Left )  Patient Location: PACU  Anesthesia Type:MAC  Level of Consciousness: awake, alert  and oriented  Airway & Oxygen Therapy: Patient Spontanous Breathing  Post-op Assessment: Report given to RN  Post vital signs: Reviewed and stable  Last Vitals:  Vitals Value Taken Time  BP 126/90 12/02/2018  8:12 AM  Temp 36.8 C 12/02/2018  8:12 AM  Pulse 90 12/02/2018  8:12 AM  Resp 8 12/02/2018  8:12 AM  SpO2 98 % 12/02/2018  8:12 AM  Vitals shown include unvalidated device data.  Last Pain:  Vitals:   12/02/18 0529  TempSrc: Oral         Complications: No apparent anesthesia complications

## 2018-12-02 NOTE — H&P (Signed)
Madeline Hawkins is an 53 y.o. female. [id number]  RP: Recurrent Left Bartholin gland cyst for marsupialization  HPI: Mild tenderness with a bump at the Left vulva.  No drainage. H/O many I&D and Word catheter treatments.  Pertinent Gynecological History: Menses: post-menopausal Contraception: post menopausal status Blood transfusions: none Sexually transmitted diseases: no past history Last mammogram: normal  Last pap: normal   Menstrual History: Patient's last menstrual period was 11/10/2011.    Past Medical History:  Diagnosis Date  . Anxiety   . Bartholin gland cyst   . CIN I (cervical intraepithelial neoplasia I) 1989   cryo...also in 2008 .. had cryo   . GERD (gastroesophageal reflux disease)   . Hyperlipidemia   . Hypertension     Past Surgical History:  Procedure Laterality Date  . ABDOMINAL HYSTERECTOMY  11/22/2011   Procedure: HYSTERECTOMY ABDOMINAL;  Surgeon: Terrance Mass, MD;  Location: [facility];  Service: Gynecology;  Laterality: N/A;  . colon polyps removed    . GYNECOLOGIC CRYOSURGERY  07/07/2007  . LAPAROSCOPIC LYSIS OF ADHESIONS  06/28/2015   Procedure: LAPAROSCOPIC LYSIS OF ABDOMINAL PELVIC ADHESIONS;  Surgeon: Terrance Mass, MD;  Location: [facility];  Service: Gynecology;;  . LAPAROSCOPIC UNILATERAL SALPINGO OOPHERECTOMY Right 06/28/2015   Procedure: LAPAROSCOPIC RIGHT SALPINGO OOPHORECTOMY;  Surgeon: Terrance Mass, MD;  Location: [facility];  Service: Gynecology;  Laterality: Right;  . LAPAROSCOPIC UNILATERAL SALPINGO OOPHERECTOMY Left 10/11/2016   Procedure: LAPAROSCOPIC UNILATERAL OOPHORECTOMY with pelvic washings;  Surgeon: Terrance Mass, MD;  Location: [facility];  Service: Gynecology;  Laterality: Left;  HARMONIC SCAPEL  . LAPAROSCOPY  11/22/2011   Procedure: LAPAROSCOPY DIAGNOSTIC;  Surgeon: Terrance Mass, MD;  Location: [facility];  Service: Gynecology;  Laterality: N/A;  . OVARIAN CYST REMOVAL  11/22/2011   Procedure: OVARIAN CYSTECTOMY;  Surgeon: Terrance Mass, MD;  Location: [facility];  Service: Gynecology;  Laterality: Left;  . SALPINGECTOMY     left for ectopic pregnancy  . TUBAL LIGATION     right tubal ligation    Family History  Problem Relation Age of Onset  . Diabetes Brother   . Ovarian cancer Mother   . Ovarian cancer Paternal Aunt   . Heart disease Maternal Grandmother   . Breast cancer Maternal Grandmother        Age 87's  . Breast cancer Maternal Aunt        Age 70's    Social History:  reports that she has never smoked. She has never used smokeless tobacco. She reports that she does not drink alcohol or use drugs.  Allergies: No Known Allergies  Medications Prior to Admission  Medication Sig Dispense Refill Last Dose  . azelastine (OPTIVAR) 0.05 % ophthalmic solution Place 1 drop into both eyes 2 (two) times daily. 6 mL 12 12/01/2018 at Unknown time  . estradiol (CLIMARA - DOSED IN MG/24 HR) 0.1 mg/24hr patch PLACE 1 PATCH (0.1 MG TOTAL) ONTO THE SKIN ONCE A WEEK. 12 patch 0 Past Week at Unknown time  . hydrochlorothiazide (HYDRODIURIL) 12.5 MG tablet TAKE 1 TABLET BY MOUTH EVERY DAY 30 tablet 3 12/01/2018 at Unknown time  . meclizine (ANTIVERT) 25 MG tablet Take 1 tablet (25 mg total) by mouth 3 (three) times daily as needed for dizziness. 30 tablet 0 12/01/2018 at Unknown time  . Multiple Vitamins-Minerals (WOMENS MULTIVITAMIN PLUS) TABS Take by mouth daily.   12/01/2018 at Unknown time  . atorvastatin (LIPITOR) 20 MG tablet TAKE 1 TABLET BY  MOUTH EVERY DAY 30 tablet 0 Taking    REVIEW OF SYSTEMS: A ROS was performed and pertinent positives and negatives are included in the history.  GENERAL: No fevers or chills. HEENT: No change in vision, no earache, sore throat or sinus congestion. NECK: No pain or stiffness. CARDIOVASCULAR: No chest pain or pressure. No palpitations. PULMONARY: No shortness of breath, cough or wheeze. GASTROINTESTINAL: No abdominal pain, nausea, vomiting or diarrhea, melena or bright red blood per  rectum. GENITOURINARY: No urinary frequency, urgency, hesitancy or dysuria. MUSCULOSKELETAL: No joint or muscle pain, no back pain, no recent trauma. DERMATOLOGIC: No rash, no itching, no lesions. ENDOCRINE: No polyuria, polydipsia, no heat or cold intolerance. No recent change in weight. HEMATOLOGICAL: No anemia or easy bruising or bleeding. NEUROLOGIC: No headache, seizures, numbness, tingling or weakness. PSYCHIATRIC: No depression, no loss of interest in normal activity or change in sleep pattern.     Blood pressure (!) 134/91, pulse 84, temperature 98.1 F (36.7 C), temperature source Oral, resp. rate 16, height 5\' 3"  (1.6 m), weight 95.8 kg, last menstrual period 11/10/2011, SpO2 99 %.  Physical Exam:  See office notes  Assessment/Plan:  53 y.o. [id number]   1. 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  We will proceed with a left Bartholin gland marsupialization.  Procedure and risks reviewed with patient.  Patient voiced understanding and agreement with plan.  Patient will be fasting for 6 hours prior to the procedure.  Postop precautions and management of packing discussed with patient.  Will remove packing 24 hours after procedure.                        Patient was counseled as to the risk of surgery to include the following:  1. Infection (prohylactic antibiotics will be administered)  2. DVT/Pulmonary Embolism (prophylactic pneumo compression stockings will be used)  3.Trauma to internal organs requiring additional surgical procedure to repair any injury to internal organs requiring perhaps additional hospitalization days.  4.Hemmorhage requiring transfusion and blood products which carry risks such as  anaphylactic reaction, hepatitis and AIDS  Patient had received literature information on the procedure scheduled and all her questions were answered and fully accepts all risk.   Marie-Lyne LavoieMD7:29 AM      Princess Bruins 12/02/2018, 7:16 AM

## 2018-12-02 NOTE — Op Note (Addendum)
Operative Note  12/02/2018  8:26 AM  PATIENT:  Madeline Hawkins  53 y.o. female  PRE-OPERATIVE DIAGNOSIS: Left recurrent Bartholin gland cyst/abcess  POST-OPERATIVE DIAGNOSIS:  Left recurrent Bartholin gland abcess  PROCEDURE:  Procedure(s): BARTHOLIN MARSUPIALIZATION OF LEFT GLAND  SURGEON:  Surgeon(s): Princess Bruins, MD  ANESTHESIA:   local and MAC  FINDINGS: Left Bartholin gland abscess 3 x 3 cm.  Drainage of liquid pus when surgically opened.  DESCRIPTION OF OPERATION: Under MAC anesthesia the patient is in lithotomy position.  Betadine prep of the supra pubic, vulvar and vaginal areas.  Patient is draped as usual.  Timeout is done.  The bladder is catheterized.  The gynecologic exam of the vulva confirm a left Bartholin gland cyst or abscess measuring 3 x 3 cm.  Local anesthesia with lidocaine 1%.  On the left inner margin between vagina and vulva.  A vertical incision of 2 cm is done with the scalp L.  Drainage of liquid pus beige in color.  Using the index finger, the left Bartholin gland lining is palpated for any loculation or irregularities.  A loculation is opened and drained as well.  Otherwise the wall of the Bartholin gland feels smooth.  Irrigation of the left Bartholin gland with sterile water.  Hemostasis is completed with the electrocautery where needed.  Marsupialization stitches are done all around the opening of the gland with Vicryl 3-0 and separate stitches including the skin or mucosa on the outside and the wall of the Bartholin gland on the other side.  About 12 stitches are done.  Hemostasis is adequate at all levels.  The wand is passed confirming no sponges in the vagina.  Packing with 1/4 inch plain pack.  All instruments are removed.  The patient is brought to recovery room in good and stable status.  ESTIMATED BLOOD LOSS: 10 mL   Intake/Output Summary (Last 24 hours) at 12/02/2018 1275 Last data filed at 12/02/2018 0802 Gross per 24 hour  Intake 1000 ml   Output 30 ml  Net 970 ml     BLOOD ADMINISTERED:none   LOCAL MEDICATIONS USED:  LIDOCAINE   SPECIMEN:  Source of Specimen:  None  DISPOSITION OF SPECIMEN:  N/A  COUNTS:  YES  PLAN OF CARE: Transfer to PACU  Marie-Lyne LavoieMD8:26 AM

## 2018-12-02 NOTE — Anesthesia Procedure Notes (Signed)
Procedure Name: MAC Date/Time: 12/02/2018 7:31 AM Performed by: Bonney Aid, CRNA Pre-anesthesia Checklist: Patient identified, Emergency Drugs available, Suction available, Patient being monitored and Timeout performed Patient Re-evaluated:Patient Re-evaluated prior to induction Oxygen Delivery Method: Nasal cannula Placement Confirmation: positive ETCO2

## 2018-12-03 ENCOUNTER — Telehealth: Payer: Self-pay

## 2018-12-03 ENCOUNTER — Encounter (HOSPITAL_BASED_OUTPATIENT_CLINIC_OR_DEPARTMENT_OTHER): Payer: Self-pay | Admitting: Obstetrics & Gynecology

## 2018-12-03 NOTE — Telephone Encounter (Signed)
Letter typed, Dr. Dellis Filbert signed it and it is at front desk for patient to come pick up tomorrow.

## 2018-12-03 NOTE — Anesthesia Postprocedure Evaluation (Signed)
Anesthesia Post Note  Patient: Madeline Hawkins  Procedure(s) Performed: BARTHOLIN MARSUPIALIZATION OF LEFT GLAND (Left )     Patient location during evaluation: PACU Anesthesia Type: MAC Level of consciousness: awake and alert Pain management: pain level controlled Vital Signs Assessment: post-procedure vital signs reviewed and stable Respiratory status: spontaneous breathing, nonlabored ventilation, respiratory function stable and patient connected to nasal cannula oxygen Cardiovascular status: stable and blood pressure returned to baseline Postop Assessment: no apparent nausea or vomiting Anesthetic complications: no    Last Vitals:  Vitals:   12/02/18 0900 12/02/18 0945  BP: 126/84 124/75  Pulse: 84 78  Resp: 14 16  Temp:  36.6 C  SpO2: 95% 100%    Last Pain:  Vitals:   12/02/18 0945  TempSrc:   PainSc: 0-No pain                 [name] L Baldo Hufnagle

## 2018-12-03 NOTE — Telephone Encounter (Signed)
Patient called stating that she had outpatient surgery yesterday and that Dr. Marguerita Merles had told her that she could return to work Friday and she needs a note.

## 2019-01-15 ENCOUNTER — Other Ambulatory Visit: Payer: Self-pay | Admitting: Physician Assistant

## 2019-01-22 ENCOUNTER — Other Ambulatory Visit: Payer: Self-pay | Admitting: Physician Assistant

## 2019-01-22 NOTE — Telephone Encounter (Signed)
Please advise if patient is due for a blood pressure follow up?

## 2019-02-09 ENCOUNTER — Other Ambulatory Visit: Payer: Self-pay | Admitting: Physician Assistant

## 2019-02-09 NOTE — Telephone Encounter (Signed)
Patient is due for an OV for Cholesterol. Will contact to schedule a Virtual visit.

## 2019-02-10 ENCOUNTER — Encounter: Payer: Self-pay | Admitting: Physician Assistant

## 2019-02-10 ENCOUNTER — Other Ambulatory Visit: Payer: Self-pay

## 2019-02-10 ENCOUNTER — Ambulatory Visit (INDEPENDENT_AMBULATORY_CARE_PROVIDER_SITE_OTHER): Payer: PRIVATE HEALTH INSURANCE | Admitting: Physician Assistant

## 2019-02-10 VITALS — Temp 96.8°F

## 2019-02-10 DIAGNOSIS — I10 Essential (primary) hypertension: Secondary | ICD-10-CM

## 2019-02-10 DIAGNOSIS — K219 Gastro-esophageal reflux disease without esophagitis: Secondary | ICD-10-CM

## 2019-02-10 DIAGNOSIS — E785 Hyperlipidemia, unspecified: Secondary | ICD-10-CM

## 2019-02-10 DIAGNOSIS — H6502 Acute serous otitis media, left ear: Secondary | ICD-10-CM

## 2019-02-10 MED ORDER — HYDROCHLOROTHIAZIDE 12.5 MG PO TABS: 12.5000 mg | ORAL_TABLET | Freq: Every day | ORAL | 1 refills | Status: DC

## 2019-02-10 MED ORDER — ATORVASTATIN CALCIUM 20 MG PO TABS: 20.0000 mg | ORAL_TABLET | Freq: Every day | ORAL | 1 refills | Status: DC

## 2019-02-10 MED ORDER — MECLIZINE HCL 25 MG PO TABS: 25.0000 mg | ORAL_TABLET | Freq: Three times a day (TID) | ORAL | 0 refills | Status: DC | PRN

## 2019-02-10 MED ORDER — PANTOPRAZOLE SODIUM 40 MG PO TBEC: 40.0000 mg | DELAYED_RELEASE_TABLET | Freq: Every day | ORAL | 3 refills | Status: DC

## 2019-02-10 NOTE — Progress Notes (Signed)
I have discussed the procedure for the virtual visit with the patient who has given consent to proceed with assessment and treatment.   Madeline Hawkins S [name], CMA     

## 2019-02-10 NOTE — Progress Notes (Signed)
Virtual Visit via Video Note I connected with Madeline Hawkins on 02/10/19 at  9:00 AM EDT by a video enabled telemedicine application and verified that I am speaking with the correct person using two identifiers.   I discussed the limitations of evaluation and management by telemedicine and the availability of in person appointments. The patient expressed understanding and agreed to proceed.  History of Present Illness: Patient presents today via [employer] for follow-up of hypertension and hyperlipidemia. Also with acute concerns.   Hypertension -- Patient is currently on a regimen of HCTZ 12.5 mg daily.  Is taking as directed. Patient denies chest pain, palpitations, lightheadedness, dizziness, vision changes or frequent headaches.  BP Readings from Last 3 Encounters:  12/02/18 124/75  12/01/18 130/86  11/10/18 134/78   Hyperlipidemia -- Currently on a regimen of atorvastatin 20 mg daily. Is taking as directed. Notes a well-balanced diet overall. Has cut out all fried foods. Is not exercising currently.   Acute Concerns: Patient and her co-workers recently had to be quarantined for 14 days because a co-workers baby had a fever. This turned out to be teething but because she worked in such close proximity to the mother, they made her and some others quarantine too to be on the safe side giving COVID pandemic.  Patient also noting recurrence of daily GERD. Denies nausea, vomiting, epigastric pain. Denies change to bowel habits. Worse at night. Has been taking OTC Prilosec with only slight relief. Notes she does eat some ice cream occasionally before bed. Otherwise no eating after 5 PM.  Observations/Objective: Patient is well-developed, well-nourished in no acute distress.  Resting comfortably in chair at home.  Head is normocephalic, atraumatic.  No labored breathing.  Speech is clear and coherent with logical contest.  Patient is alert and oriented at baseline.   Assessment and Plan: 1.  Acute serous otitis media of left ear, recurrence not specified MEclizine refilled to have on hand in case of bout of her BPPV and ETD.  - meclizine (ANTIVERT) 25 MG tablet; Take 1 tablet (25 mg total) by mouth 3 (three) times daily as needed for dizziness.  Dispense: 30 tablet; Refill: 0  2. Hyperlipidemia, unspecified hyperlipidemia type Repeat fasting labs ordered. She will come tomorrow to tent to have drawn. Dietary and exercise recommendations reviewed with patient. Continue statin. - atorvastatin (LIPITOR) 20 MG tablet; Take 1 tablet (20 mg total) by mouth daily.  Dispense: 90 tablet; Refill: 1 - Comp Met (CMET) - Lipid panel  3. Essential hypertension Will check BP tomorrow at lab appointment (tent). Medications refilled. CMP ordered. Dietary recommendations reviewed.  - hydrochlorothiazide (HYDRODIURIL) 12.5 MG tablet; Take 1 tablet (12.5 mg total) by mouth daily.  Dispense: 90 tablet; Refill: 1 - Comp Met (CMET)  4. Gastroesophageal reflux disease without esophagitis GERD diet reviewed. Handout sent to MyChart. Start 2 week trial of diet and Protonix. Follow-up via MyChart or Video at that time.    - pantoprazole (PROTONIX) 40 MG tablet; Take 1 tablet (40 mg total) by mouth daily.  Dispense: 30 tablet; Refill: 3   Follow Up Instructions: I discussed the assessment and treatment plan with the patient. The patient was provided an opportunity to ask questions and all were answered. The patient agreed with the plan and demonstrated an understanding of the instructions.   The patient was advised to call back or seek an in-person evaluation if the symptoms worsen or if the condition fails to improve as anticipated.   Leeanne Rio, PA-C

## 2019-02-10 NOTE — Patient Instructions (Addendum)
Instructions sent to MyChart.  Please continue chronic medications as directed. Keep a low salt-diet. Work on getting active again to help promote weight loss which will help BP levels.  Stop the OTC Prilosec. Start the Protonix daily as directed. Follow the dietary recommendations below.  Let me know if things are not improving. We will see you tomorrow at your lab appointment.    Gastroesophageal Reflux Disease, Adult Gastroesophageal reflux (GER) happens when acid from the stomach flows up into the tube that connects the mouth and the stomach (esophagus). Normally, food travels down the esophagus and stays in the stomach to be digested. With GER, food and stomach acid sometimes move back up into the esophagus. You may have a disease called gastroesophageal reflux disease (GERD) if the reflux:  Happens often.  Causes frequent or very bad symptoms.  Causes problems such as damage to the esophagus. When this happens, the esophagus becomes sore and swollen (inflamed). Over time, GERD can make small holes (ulcers) in the lining of the esophagus. What are the causes? This condition is caused by a problem with the muscle between the esophagus and the stomach. When this muscle is weak or not normal, it does not close properly to keep food and acid from coming back up from the stomach. The muscle can be weak because of:  Tobacco use.  Pregnancy.  Having a certain type of hernia (hiatal hernia).  Alcohol use.  Certain foods and drinks, such as coffee, chocolate, onions, and peppermint. What increases the risk? You are more likely to develop this condition if you:  Are overweight.  Have a disease that affects your connective tissue.  Use NSAID medicines. What are the signs or symptoms? Symptoms of this condition include:  Heartburn.  Difficult or painful swallowing.  The feeling of having a lump in the throat.  A bitter taste in the mouth.  Bad breath.  Having a lot of  saliva.  Having an upset or bloated stomach.  Belching.  Chest pain. Different conditions can cause chest pain. Make sure you see your doctor if you have chest pain.  Shortness of breath or noisy breathing (wheezing).  Ongoing (chronic) cough or a cough at night.  Wearing away of the surface of teeth (tooth enamel).  Weight loss. How is this treated? Treatment will depend on how bad your symptoms are. Your doctor may suggest:  Changes to your diet.  Medicine.  Surgery. Follow these instructions at home: Eating and drinking   Follow a diet as told by your doctor. You may need to avoid foods and drinks such as: ? Coffee and tea (with or without caffeine). ? Drinks that contain alcohol. ? Energy drinks and sports drinks. ? Bubbly (carbonated) drinks or sodas. ? Chocolate and cocoa. ? Peppermint and mint flavorings. ? Garlic and onions. ? Horseradish. ? Spicy and acidic foods. These include peppers, chili powder, curry powder, vinegar, hot sauces, and BBQ sauce. ? Citrus fruit juices and citrus fruits, such as oranges, lemons, and limes. ? Tomato-based foods. These include red sauce, chili, salsa, and pizza with red sauce. ? Fried and fatty foods. These include donuts, french fries, potato chips, and high-fat dressings. ? High-fat meats. These include hot dogs, rib eye steak, sausage, ham, and bacon. ? High-fat dairy items, such as whole milk, butter, and cream cheese.  Eat small meals often. Avoid eating large meals.  Avoid drinking large amounts of liquid with your meals.  Avoid eating meals during the 2-3 hours before bedtime.  Avoid lying down right after you eat.  Do not exercise right after you eat. Lifestyle   Do not use any products that contain nicotine or tobacco. These include cigarettes, e-cigarettes, and chewing tobacco. If you need help quitting, ask your doctor.  Try to lower your stress. If you need help doing this, ask your doctor.  If you are  overweight, lose an amount of weight that is healthy for you. Ask your doctor about a safe weight loss goal. General instructions  Pay attention to any changes in your symptoms.  Take over-the-counter and prescription medicines only as told by your doctor. Do not take aspirin, ibuprofen, or other NSAIDs unless your doctor says it is okay.  Wear loose clothes. Do not wear anything tight around your waist.  Raise (elevate) the head of your bed about 6 inches (15 cm).  Avoid bending over if this makes your symptoms worse.  Keep all follow-up visits as told by your doctor. This is important. Contact a doctor if:  You have new symptoms.  You lose weight and you do not know why.  You have trouble swallowing or it hurts to swallow.  You have wheezing or a cough that keeps happening.  Your symptoms do not get better with treatment.  You have a hoarse voice. Get help right away if:  You have pain in your arms, neck, jaw, teeth, or back.  You feel sweaty, dizzy, or light-headed.  You have chest pain or shortness of breath.  You throw up (vomit) and your throw-up looks like blood or coffee grounds.  You pass out (faint).  Your poop (stool) is bloody or black.  You cannot swallow, drink, or eat. Summary  If a person has gastroesophageal reflux disease (GERD), food and stomach acid move back up into the esophagus and cause symptoms or problems such as damage to the esophagus.  Treatment will depend on how bad your symptoms are.  Follow a diet as told by your doctor.  Take all medicines only as told by your doctor. This information is not intended to replace advice given to you by your health care provider. Make sure you discuss any questions you have with your health care provider. Document Released: 04/02/2008 Document Revised: 04/23/2018 Document Reviewed: 04/23/2018 Elsevier Interactive Patient Education  2019 [employer].

## 2019-02-11 ENCOUNTER — Other Ambulatory Visit: Payer: PRIVATE HEALTH INSURANCE

## 2019-02-11 LAB — COMPREHENSIVE METABOLIC PANEL
ALT: 17 U/L (ref 0–35)
AST: 15 U/L (ref 0–37)
Albumin: 4.1 g/dL (ref 3.5–5.2)
Alkaline Phosphatase: 59 U/L (ref 39–117)
BUN: 11 mg/dL (ref 6–23)
CO2: 28 mEq/L (ref 19–32)
Calcium: 9.4 mg/dL (ref 8.4–10.5)
Chloride: 102 mEq/L (ref 96–112)
Creatinine, Ser: 0.83 mg/dL (ref 0.40–1.20)
GFR: 87.83 mL/min (ref >60.00)
Glucose, Bld: 75 mg/dL (ref 70–99)
Potassium: 3.8 mEq/L (ref 3.5–5.1)
Sodium: 139 mEq/L (ref 135–145)
Total Bilirubin: 0.6 mg/dL (ref 0.2–1.2)
Total Protein: 6.6 g/dL (ref 6.0–8.3)

## 2019-02-11 LAB — LIPID PANEL
Cholesterol: 179 mg/dL (ref 0–200)
HDL: 35.9 mg/dL — ABNORMAL LOW (ref >39.00)
LDL Cholesterol: 127 mg/dL — ABNORMAL HIGH (ref 0–99)
NonHDL: 143.02
Total CHOL/HDL Ratio: 5
Triglycerides: 82 mg/dL (ref 0.0–149.0)
VLDL: 16.4 mg/dL (ref 0.0–40.0)

## 2019-02-13 ENCOUNTER — Telehealth: Payer: Self-pay | Admitting: Emergency Medicine

## 2019-02-13 ENCOUNTER — Other Ambulatory Visit: Payer: Self-pay | Admitting: Emergency Medicine

## 2019-02-13 DIAGNOSIS — E785 Hyperlipidemia, unspecified: Secondary | ICD-10-CM

## 2019-02-13 MED ORDER — ATORVASTATIN CALCIUM 40 MG PO TABS: 40.0000 mg | ORAL_TABLET | Freq: Every day | ORAL | 0 refills | Status: DC

## 2019-02-13 NOTE — Telephone Encounter (Signed)
Patient states since taking the Protonix 40 mg she has been having loose stools. Unable to go to work today due to loose stools.   Please advise

## 2019-02-13 NOTE — Telephone Encounter (Signed)
Would have her first try to cut back to 1/2 tablet daily to see if this helps. Start daily probiotic. And make sure to increase fiber intake. If not improving, stop medicine completely and let me know.

## 2019-02-13 NOTE — Telephone Encounter (Signed)
Advised patient to cut the Pantoprazole to 1/2 tab and start probiotic daily and increase fiber intake. She is agreeable. If symptoms persist then to notify the office and will completley stop the Pantoprazole.

## 2019-02-22 ENCOUNTER — Other Ambulatory Visit: Payer: Self-pay | Admitting: Obstetrics & Gynecology

## 2019-02-24 ENCOUNTER — Other Ambulatory Visit: Payer: Self-pay | Admitting: Obstetrics & Gynecology

## 2019-02-24 MED ORDER — ESTRADIOL 0.1 MG/24HR TD PTWK: 0.1000 mg | MEDICATED_PATCH | TRANSDERMAL | 0 refills | Status: DC

## 2019-02-24 NOTE — Telephone Encounter (Signed)
Annual exam now scheduled on 03/04/19. Rx sent

## 2019-03-04 ENCOUNTER — Encounter: Payer: Self-pay | Admitting: Obstetrics & Gynecology

## 2019-03-04 ENCOUNTER — Other Ambulatory Visit: Payer: Self-pay

## 2019-03-04 ENCOUNTER — Ambulatory Visit (INDEPENDENT_AMBULATORY_CARE_PROVIDER_SITE_OTHER): Payer: PRIVATE HEALTH INSURANCE | Admitting: Obstetrics & Gynecology

## 2019-03-04 VITALS — BP 126/84 | Ht 63.0 in | Wt 211.0 lb

## 2019-03-04 DIAGNOSIS — N951 Menopausal and female climacteric states: Secondary | ICD-10-CM

## 2019-03-04 DIAGNOSIS — E6609 Other obesity due to excess calories: Secondary | ICD-10-CM

## 2019-03-04 DIAGNOSIS — Z01419 Encounter for gynecological examination (general) (routine) without abnormal findings: Secondary | ICD-10-CM | POA: Diagnosis not present

## 2019-03-04 DIAGNOSIS — Z90710 Acquired absence of both cervix and uterus: Secondary | ICD-10-CM

## 2019-03-04 DIAGNOSIS — Z9079 Acquired absence of other genital organ(s): Secondary | ICD-10-CM

## 2019-03-04 DIAGNOSIS — Z79890 Hormone replacement therapy (postmenopausal): Secondary | ICD-10-CM

## 2019-03-04 DIAGNOSIS — Z90722 Acquired absence of ovaries, bilateral: Secondary | ICD-10-CM

## 2019-03-04 DIAGNOSIS — Z6837 Body mass index (BMI) 37.0-37.9, adult: Secondary | ICD-10-CM

## 2019-03-04 MED ORDER — ESTRADIOL 0.1 MG/24HR TD PTWK: 0.1000 mg | MEDICATED_PATCH | TRANSDERMAL | 4 refills | Status: DC

## 2019-03-04 NOTE — Patient Instructions (Signed)
1. Well female exam with routine gynecological exam Gynecologic exam status post TAH/BSO.  Pap test January 2019 was negative, no indication to repeat this year.  Breast exam normal.  Patient will schedule screening mammogram now.  Colonoscopy scheduled now.  Health labs with PA Raiford Noble.  2. 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  Well on estradiol patch 0.1 weekly.  No contraindication to continue.  Prescription sent to pharmacy.  3. S/P TAH-BSO  4. Class 2 obesity due to excess calories without serious comorbidity with body mass index (BMI) of 37.0 to 37.9 in adult Recommend a lower calorie/carb diet such as [employer].  Aerobic physical activities 5 times a week and weightlifting every 2 days.  Other orders - estradiol (CLIMARA - DOSED IN MG/24 HR) 0.1 mg/24hr patch; Place 1 patch (0.1 mg total) onto the skin once a week.  Maddyn, it was a pleasure seeing you today!

## 2019-03-04 NOTE — Progress Notes (Signed)
Madeline Hawkins Aug 29, 1966 [id number]   History:    53 y.o. [id number] Married  RP:  Established patient presenting for annual gyn exam   HPI: S/P TAH, then BSO.  Menopause, well on Estradiol patch 0.1.  No pelvic pain.  No pain with IC.  No recurrence of Lt Bartholin gland cyst/abscess post Lt Bartholin gland Marsupialization 11/2018.  Urine/BMs normal.  Breasts normal.  BMI 37.38.  Plans to increase physical activity with biking.  Health labs with PA.  Colonoscopy scheduled.  Past medical history,surgical history, family history and social history were all reviewed and documented in the EPIC chart.  Gynecologic History Patient's last menstrual period was 11/10/2011. Contraception: status post hysterectomy Last Pap: 10/2017. Results were: Negative Last mammogram: 05/2016. Results were: Negative.  Will schedule now. Bone Density: 11/2017 Normal Colonoscopy: 2015 Benign Polyp.  Scheduled now.  Obstetric History OB History  Gravida Para Term Preterm AB Living  5 4 4   1 4   SAB TAB Ectopic Multiple Live Births  0   1   4    # Outcome Date GA Lbr Len/2nd Weight Sex Delivery Anes PTL Lv  5 Ectopic           4 Term     F Vag-Spont  N LIV  3 Term     M Vag-Spont  N LIV  2 Term     F Vag-Spont  N LIV  1 Term     F Vag-Spont  N LIV     ROS: A ROS was performed and pertinent positives and negatives are included in the history.  GENERAL: No fevers or chills. HEENT: No change in vision, no earache, sore throat or sinus congestion. NECK: No pain or stiffness. CARDIOVASCULAR: No chest pain or pressure. No palpitations. PULMONARY: No shortness of breath, cough or wheeze. GASTROINTESTINAL: No abdominal pain, nausea, vomiting or diarrhea, melena or bright red blood per rectum. GENITOURINARY: No urinary frequency, urgency, hesitancy or dysuria. MUSCULOSKELETAL: No joint or muscle pain, no back pain, no recent trauma. DERMATOLOGIC: No rash, no itching, no lesions. ENDOCRINE: No polyuria, polydipsia, no  heat or cold intolerance. No recent change in weight. HEMATOLOGICAL: No anemia or easy bruising or bleeding. NEUROLOGIC: No headache, seizures, numbness, tingling or weakness. PSYCHIATRIC: No depression, no loss of interest in normal activity or change in sleep pattern.     Exam:   BP 126/84   Ht 5\' 3"  (1.6 m)   Wt 211 lb (95.7 kg)   LMP 11/10/2011   BMI 37.38 kg/m   Body mass index is 37.38 kg/m.  General appearance : Well developed well nourished female. No acute distress HEENT: Eyes: no retinal hemorrhage or exudates,  Neck supple, trachea midline, no carotid bruits, no thyroidmegaly Lungs: Clear to auscultation, no rhonchi or wheezes, or rib retractions  Heart: Regular rate and rhythm, no murmurs or gallops Breast:Examined in sitting and supine position were symmetrical in appearance, no palpable masses or tenderness,  no skin retraction, no nipple inversion, no nipple discharge, no skin discoloration, no axillary or supraclavicular lymphadenopathy Abdomen: no palpable masses or tenderness, no rebound or guarding Extremities: no edema or skin discoloration or tenderness  Pelvic: Vulva: Normal             Vagina: No gross lesions or discharge  Cervix/Uterus absent  Adnexa  Without masses or tenderness  Anus: Normal   Assessment/Plan:  53 y.o. female for annual exam   1. Well female exam with routine gynecological exam  Gynecologic exam status post TAH/BSO.  Pap test January 2019 was negative, no indication to repeat this year.  Breast exam normal.  Patient will schedule screening mammogram now.  Colonoscopy scheduled now.  Health labs with PA Raiford Noble.  2. 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  Well on estradiol patch 0.1 weekly.  No contraindication to continue.  Prescription sent to pharmacy.  3. S/P TAH-BSO  4. Class 2 obesity due to excess calories without serious comorbidity with body mass index (BMI) of 37.0 to 37.9  in adult Recommend a lower calorie/carb diet such as [employer].  Aerobic physical activities 5 times a week and weightlifting every 2 days.  Other orders - estradiol (CLIMARA - DOSED IN MG/24 HR) 0.1 mg/24hr patch; Place 1 patch (0.1 mg total) onto the skin once a week.  Princess Bruins MD, 8:47 AM 03/04/2019

## 2019-03-09 ENCOUNTER — Telehealth: Payer: Self-pay | Admitting: Physician Assistant

## 2019-03-09 DIAGNOSIS — E785 Hyperlipidemia, unspecified: Secondary | ICD-10-CM

## 2019-03-09 MED ORDER — ATORVASTATIN CALCIUM 40 MG PO TABS: 40.0000 mg | ORAL_TABLET | Freq: Every day | ORAL | 1 refills | Status: DC

## 2019-03-09 NOTE — Telephone Encounter (Signed)
Copied from [place] [id number]. Topic: Quick Communication - See Telephone Encounter >> Mar 09, 2019 12:32 PM Vernona Rieger wrote: CRM for notification. See Telephone encounter for: 03/09/19.  Patient states she needs a refill on her cholesterol medication. She does not know the name. She said all that she knows is that [place] changed it from 20 mg to 40 mg. She was finishing up her 20 mg taking it twice a day and now she is out.  [id number] - [place], [place] [address] [place] [address] Phone: [phone] Fax: [phone]

## 2019-03-09 NOTE — Telephone Encounter (Signed)
Sent in rx for Pravastain 40 mg to the preferred pharmacy.

## 2019-04-06 ENCOUNTER — Encounter: Payer: Self-pay | Admitting: Emergency Medicine

## 2019-04-06 LAB — HM COLONOSCOPY

## 2019-06-18 ENCOUNTER — Other Ambulatory Visit: Payer: Self-pay | Admitting: Physician Assistant

## 2019-06-18 DIAGNOSIS — H6502 Acute serous otitis media, left ear: Secondary | ICD-10-CM

## 2019-07-22 ENCOUNTER — Encounter: Payer: Self-pay | Admitting: Gynecology

## 2019-08-23 ENCOUNTER — Other Ambulatory Visit: Payer: Self-pay | Admitting: Physician Assistant

## 2019-08-23 DIAGNOSIS — I10 Essential (primary) hypertension: Secondary | ICD-10-CM

## 2019-08-24 ENCOUNTER — Encounter: Payer: Self-pay | Admitting: Emergency Medicine

## 2019-08-26 ENCOUNTER — Ambulatory Visit (INDEPENDENT_AMBULATORY_CARE_PROVIDER_SITE_OTHER): Payer: BC Managed Care – PPO | Admitting: Physician Assistant

## 2019-08-26 ENCOUNTER — Other Ambulatory Visit: Payer: Self-pay

## 2019-08-26 ENCOUNTER — Encounter: Payer: Self-pay | Admitting: Physician Assistant

## 2019-08-26 VITALS — BP 102/80 | HR 70 | Temp 98.0°F | Resp 16 | Ht 63.0 in | Wt 219.0 lb

## 2019-08-26 DIAGNOSIS — Z23 Encounter for immunization: Secondary | ICD-10-CM | POA: Diagnosis not present

## 2019-08-26 DIAGNOSIS — K219 Gastro-esophageal reflux disease without esophagitis: Secondary | ICD-10-CM

## 2019-08-26 DIAGNOSIS — K5900 Constipation, unspecified: Secondary | ICD-10-CM

## 2019-08-26 DIAGNOSIS — E785 Hyperlipidemia, unspecified: Secondary | ICD-10-CM | POA: Diagnosis not present

## 2019-08-26 DIAGNOSIS — I10 Essential (primary) hypertension: Secondary | ICD-10-CM | POA: Diagnosis not present

## 2019-08-26 LAB — COMPREHENSIVE METABOLIC PANEL
ALT: 19 U/L (ref 0–35)
AST: 17 U/L (ref 0–37)
Albumin: 4.5 g/dL (ref 3.5–5.2)
Alkaline Phosphatase: 77 U/L (ref 39–117)
BUN: 11 mg/dL (ref 6–23)
CO2: 32 mEq/L (ref 19–32)
Calcium: 9.8 mg/dL (ref 8.4–10.5)
Chloride: 101 mEq/L (ref 96–112)
Creatinine, Ser: 0.79 mg/dL (ref 0.40–1.20)
GFR: 92.78 mL/min (ref >60.00)
Glucose, Bld: 96 mg/dL (ref 70–99)
Potassium: 4.2 mEq/L (ref 3.5–5.1)
Sodium: 140 mEq/L (ref 135–145)
Total Bilirubin: 0.5 mg/dL (ref 0.2–1.2)
Total Protein: 6.9 g/dL (ref 6.0–8.3)

## 2019-08-26 LAB — LIPID PANEL
Cholesterol: 189 mg/dL (ref 0–200)
HDL: 39.1 mg/dL (ref >39.00)
LDL Cholesterol: 134 mg/dL — ABNORMAL HIGH (ref 0–99)
NonHDL: 149.92
Total CHOL/HDL Ratio: 5
Triglycerides: 79 mg/dL (ref 0.0–149.0)
VLDL: 15.8 mg/dL (ref 0.0–40.0)

## 2019-08-26 NOTE — Progress Notes (Deleted)
Hyperlipidemia-- Lipitor 40 mg QD, taking as directed, tolerating well.   Hypertension-- on HCTZ 12.5 mg QD, tolerating well. Checks BP at home, usually around 125/77 but notes SBP occasionally in 100s, does not feel symptomatic. Denies frequent headaches, dizziness, lightheadedness, chest pain, palpitations.   Diet-- incorporating well-balanced diet, low salt intake. Working on decreasing sweets Exercise-- no physical activity currently   Health Maintenance Flu vaccine today

## 2019-08-26 NOTE — Progress Notes (Signed)
Patient presents to clinic today for follow-up of hyperlipidemia and hypertension. Patient is currently on a regimen of Atorvastatin 40 mg QD and HCTZ 12.5 mg QD. Endorses taking daily as directed. Patient denies chest pain, palpitations, lightheadedness, dizziness, vision changes or frequent headaches. Is trying to work on diet -- sweets are still her weakness, having a bowl of ice cream nightly. No regular exercise at present. In terms of GERD, notes symptoms well-controlled with Protonix. Is having to take every other day due to loose stools when taking daily. Now notes some harder stools depending on what she eats. Denies tenesmus, melena or hematochezia.   Past Medical History:  Diagnosis Date  . Anxiety   . Bartholin gland cyst   . CIN I (cervical intraepithelial neoplasia I) 1989   cryo...also in 2008 .. had cryo   . GERD (gastroesophageal reflux disease)   . Hyperlipidemia   . Hypertension     Current Outpatient Medications on File Prior to Visit  Medication Sig Dispense Refill  . atorvastatin (LIPITOR) 40 MG tablet Take 1 tablet (40 mg total) by mouth daily. 90 tablet 1  . azelastine (OPTIVAR) 0.05 % ophthalmic solution Place 1 drop into both eyes 2 (two) times daily. 6 mL 12  . estradiol (CLIMARA - DOSED IN MG/24 HR) 0.1 mg/24hr patch Place 1 patch (0.1 mg total) onto the skin once a week. 12 patch 4  . hydrochlorothiazide (HYDRODIURIL) 12.5 MG tablet TAKE 1 TABLET BY MOUTH EVERY DAY 30 tablet 5  . Multiple Vitamins-Minerals (WOMENS MULTIVITAMIN PLUS) TABS Take by mouth daily.    . pantoprazole (PROTONIX) 40 MG tablet Take 1 tablet (40 mg total) by mouth daily. 30 tablet 3  . meclizine (ANTIVERT) 25 MG tablet Take 1 tablet (25 mg total) by mouth 3 (three) times daily as needed for dizziness. (Patient not taking: Reported on 08/26/2019) 30 tablet 0   No current facility-administered medications on file prior to visit.     No Known Allergies  Family History  Problem Relation  Age of Onset  . Diabetes Brother   . Ovarian cancer Mother   . Ovarian cancer Paternal Aunt   . Heart disease Maternal Grandmother   . Breast cancer Maternal Grandmother        Age 44's  . Breast cancer Maternal Aunt        Age 18's    Social History   Socioeconomic History  . Marital status: Married    Spouse name: Not on file  . Number of children: Not on file  . Years of education: Not on file  . Highest education level: Not on file  Occupational History  . Not on file  Social Needs  . Financial resource strain: Not on file  . Food insecurity    Worry: Not on file    Inability: Not on file  . Transportation needs    Medical: Not on file    Non-medical: Not on file  Tobacco Use  . Smoking status: Never Smoker  . Smokeless tobacco: Never Used  Substance and Sexual Activity  . Alcohol use: No    Alcohol/week: 0.0 standard drinks  . Drug use: No  . Sexual activity: Yes    Partners: Male    Birth control/protection: Surgical    Comment: 1st intercourse- 19, partners- 45, married- 11 yrs   Lifestyle  . Physical activity    Days per week: Not on file    Minutes per session: Not on file  . Stress:  Not on file  Relationships  . Social Herbalist on phone: Not on file    Gets together: Not on file    Attends religious service: Not on file    Active member of club or organization: Not on file    Attends meetings of clubs or organizations: Not on file    Relationship status: Not on file  Other Topics Concern  . Not on file  Social History Narrative  . Not on file   Review of Systems - See HPI.  All other ROS are negative.  BP 102/80   Pulse 70   Temp 98 F (36.7 C) (Temporal)   Resp 16   Ht 5\' 3"  (1.6 m)   Wt 219 lb (99.3 kg)   LMP 11/10/2011   SpO2 98%   BMI 38.79 kg/m   Physical Exam Vitals signs reviewed.  Constitutional:      Appearance: Normal appearance.  HENT:     Head: Normocephalic and atraumatic.     Nose: Nose normal.      Mouth/Throat:     Mouth: Mucous membranes are moist.  Neck:     Musculoskeletal: Neck supple.  Cardiovascular:     Rate and Rhythm: Normal rate and regular rhythm.     Pulses: Normal pulses.  Pulmonary:     Effort: Pulmonary effort is normal.     Breath sounds: Normal breath sounds.  Abdominal:     General: Bowel sounds are normal. There is no distension.     Palpations: Abdomen is soft.     Tenderness: There is no abdominal tenderness. There is no guarding or rebound.  Neurological:     General: No focal deficit present.     Mental Status: She is alert and oriented to person, place, and time.  Psychiatric:        Mood and Affect: Mood normal.    Assessment/Plan: 1. Essential hypertension BP normotensive. Asymptomatic. Continue current regimen. Repeat labs today. - Comprehensive metabolic panel  2. Hyperlipidemia, unspecified hyperlipidemia type Taking medications as directed and tolerating well. Trying to work on diet and exercise but have further to go with this. Repeat fasting labs today. Dietary and exercise recommendations reviewed. Handout given.  - Comprehensive metabolic panel - Lipid panel  3. Gastroesophageal reflux disease without esophagitis Taking PPI QOD with control of symptoms. Continue the same for now. Again diet for weight loss encouraged as this will help with GERD. Will periodically review need for continued PPI therapy.   4. Constipation, unspecified constipation type Mild. Bowel regimen reviewed for this. Start daily probiotic. Follow-up if not improving.   5. Need for immunization against influenza - Flu Vaccine QUAD 36+ mos IM   Leeanne Rio, PA-C

## 2019-08-26 NOTE — Patient Instructions (Addendum)
Please go to the lab today for blood work.  I will call you with your results. We will alter treatment regimen(s) if indicated by your results.   I encourage you to increase hydration and the amount of fiber in your diet.  Start a daily probiotic (Align, Culturelle, Digestive Advantage, etc.). If no bowel movement within 24 hours, take 2 Tbs of Milk of Magnesia in a 4 oz glass of warmed prune juice every 2-3 days to help promote bowel movement. If no results within 24 hours, then repeat above regimen, adding a Dulcolax stool softener to regimen. If this does not promote a bowel movement, please call the office.  Please keep up with chronic medications daily. Work on increasing aerobic exercise to goal of 150 minutes per week.

## 2019-09-10 ENCOUNTER — Other Ambulatory Visit: Payer: Self-pay | Admitting: Physician Assistant

## 2019-09-10 DIAGNOSIS — K219 Gastro-esophageal reflux disease without esophagitis: Secondary | ICD-10-CM

## 2019-09-28 ENCOUNTER — Other Ambulatory Visit: Payer: Self-pay | Admitting: Physician Assistant

## 2019-09-28 DIAGNOSIS — E785 Hyperlipidemia, unspecified: Secondary | ICD-10-CM

## 2019-11-09 ENCOUNTER — Other Ambulatory Visit: Payer: Self-pay | Admitting: Physician Assistant

## 2019-11-09 DIAGNOSIS — H1013 Acute atopic conjunctivitis, bilateral: Secondary | ICD-10-CM

## 2019-12-15 ENCOUNTER — Other Ambulatory Visit: Payer: Self-pay | Admitting: Obstetrics & Gynecology

## 2020-02-17 ENCOUNTER — Other Ambulatory Visit: Payer: Self-pay | Admitting: Physician Assistant

## 2020-02-17 DIAGNOSIS — E785 Hyperlipidemia, unspecified: Secondary | ICD-10-CM

## 2020-02-17 DIAGNOSIS — I10 Essential (primary) hypertension: Secondary | ICD-10-CM

## 2020-02-17 NOTE — Telephone Encounter (Signed)
Due for 3 month follow up. Last OV 08/26/19.

## 2020-03-03 ENCOUNTER — Other Ambulatory Visit: Payer: Self-pay | Admitting: Obstetrics & Gynecology

## 2020-03-16 ENCOUNTER — Other Ambulatory Visit: Payer: Self-pay | Admitting: Physician Assistant

## 2020-03-16 DIAGNOSIS — E785 Hyperlipidemia, unspecified: Secondary | ICD-10-CM

## 2020-03-16 DIAGNOSIS — I10 Essential (primary) hypertension: Secondary | ICD-10-CM

## 2020-03-18 ENCOUNTER — Other Ambulatory Visit: Payer: Self-pay | Admitting: Physician Assistant

## 2020-03-18 DIAGNOSIS — Z1231 Encounter for screening mammogram for malignant neoplasm of breast: Secondary | ICD-10-CM

## 2020-03-23 ENCOUNTER — Encounter: Payer: Self-pay | Admitting: Physician Assistant

## 2020-03-23 ENCOUNTER — Ambulatory Visit
Admission: RE | Admit: 2020-03-23 | Discharge: 2020-03-23 | Disposition: A | Discharge status: Home / Self Care | Payer: BC Managed Care – PPO | Source: Ambulatory Visit | Attending: Physician Assistant | Admitting: Physician Assistant

## 2020-03-23 ENCOUNTER — Other Ambulatory Visit: Payer: Self-pay

## 2020-03-23 ENCOUNTER — Ambulatory Visit (INDEPENDENT_AMBULATORY_CARE_PROVIDER_SITE_OTHER): Payer: BC Managed Care – PPO | Admitting: Physician Assistant

## 2020-03-23 VITALS — BP 122/80 | HR 78 | Temp 98.3°F | Resp 16 | Ht 63.0 in | Wt 222.0 lb

## 2020-03-23 DIAGNOSIS — K219 Gastro-esophageal reflux disease without esophagitis: Secondary | ICD-10-CM | POA: Diagnosis not present

## 2020-03-23 DIAGNOSIS — G8929 Other chronic pain: Secondary | ICD-10-CM

## 2020-03-23 DIAGNOSIS — I10 Essential (primary) hypertension: Secondary | ICD-10-CM | POA: Diagnosis not present

## 2020-03-23 DIAGNOSIS — Z0000 Encounter for general adult medical examination without abnormal findings: Secondary | ICD-10-CM

## 2020-03-23 DIAGNOSIS — Z1231 Encounter for screening mammogram for malignant neoplasm of breast: Secondary | ICD-10-CM

## 2020-03-23 DIAGNOSIS — M25561 Pain in right knee: Secondary | ICD-10-CM

## 2020-03-23 DIAGNOSIS — E785 Hyperlipidemia, unspecified: Secondary | ICD-10-CM | POA: Diagnosis not present

## 2020-03-23 DIAGNOSIS — M25562 Pain in left knee: Secondary | ICD-10-CM

## 2020-03-23 LAB — COMPREHENSIVE METABOLIC PANEL
ALT: 20 U/L (ref 0–35)
AST: 18 U/L (ref 0–37)
Albumin: 4.4 g/dL (ref 3.5–5.2)
Alkaline Phosphatase: 69 U/L (ref 39–117)
BUN: 10 mg/dL (ref 6–23)
CO2: 30 mEq/L (ref 19–32)
Calcium: 9.6 mg/dL (ref 8.4–10.5)
Chloride: 102 mEq/L (ref 96–112)
Creatinine, Ser: 0.72 mg/dL (ref 0.40–1.20)
GFR: 103.03 mL/min (ref >60.00)
Glucose, Bld: 81 mg/dL (ref 70–99)
Potassium: 3.7 mEq/L (ref 3.5–5.1)
Sodium: 139 mEq/L (ref 135–145)
Total Bilirubin: 0.6 mg/dL (ref 0.2–1.2)
Total Protein: 6.8 g/dL (ref 6.0–8.3)

## 2020-03-23 LAB — CBC WITH DIFFERENTIAL/PLATELET
Basophils Absolute: 0 10*3/uL (ref 0.0–0.1)
Basophils Relative: 0.7 % (ref 0.0–3.0)
Eosinophils Absolute: 0.1 10*3/uL (ref 0.0–0.7)
Eosinophils Relative: 1.3 % (ref 0.0–5.0)
HCT: 38.4 % (ref 36.0–46.0)
Hemoglobin: 12.7 g/dL (ref 12.0–15.0)
Lymphocytes Relative: 42.5 % (ref 12.0–46.0)
Lymphs Abs: 2.5 10*3/uL (ref 0.7–4.0)
MCHC: 33 g/dL (ref 30.0–36.0)
MCV: 85.8 fl (ref 78.0–100.0)
Monocytes Absolute: 0.6 10*3/uL (ref 0.1–1.0)
Monocytes Relative: 10 % (ref 3.0–12.0)
Neutro Abs: 2.7 10*3/uL (ref 1.4–7.7)
Neutrophils Relative %: 45.5 % (ref 43.0–77.0)
Platelets: 234 10*3/uL (ref 150.0–400.0)
RBC: 4.48 Mil/uL (ref 3.87–5.11)
RDW: 16.7 % — ABNORMAL HIGH (ref 11.5–15.5)
WBC: 6 10*3/uL (ref 4.0–10.5)

## 2020-03-23 LAB — LIPID PANEL
Cholesterol: 171 mg/dL (ref 0–200)
HDL: 41.3 mg/dL (ref >39.00)
LDL Cholesterol: 114 mg/dL — ABNORMAL HIGH (ref 0–99)
NonHDL: 129.86
Total CHOL/HDL Ratio: 4
Triglycerides: 77 mg/dL (ref 0.0–149.0)
VLDL: 15.4 mg/dL (ref 0.0–40.0)

## 2020-03-23 LAB — HEMOGLOBIN A1C: Hgb A1c MFr Bld: 6.1 % (ref 4.6–6.5)

## 2020-03-23 MED ORDER — HYDROCHLOROTHIAZIDE 12.5 MG PO TABS: 12.5000 mg | ORAL_TABLET | Freq: Every day | ORAL | 1 refills | Status: DC

## 2020-03-23 NOTE — Progress Notes (Signed)
Patient presents to clinic today for annual exam.  Patient is fasting for labs.  Acute Concerns: Patient notes some ongoing issue with medial knee pain bilaterally, worse by the end of the day. Will occasionally have a numbness sensation of lateral distal lower extremity, worse with wearing jeans or tight-fitting pants but can happen at any time.   Chronic Issues: Hypertension -- Patient is currently on a regimen of HCTZ 12.5 mg daily. Endorses taking medication as directed. Patient denies chest pain, palpitations, lightheadedness, dizziness, vision changes or frequent headaches.  BP Readings from Last 3 Encounters:  03/23/20 122/80  08/26/19 102/80  03/04/19 126/84   Hyperlipidemia -- Is currently on a regimen of Atorvastatin 40 mg, taking daily. Is tolerating well. No regular exercise at present due to "laziness" per patient report. Eats well overall but has a big issue with nighttime snacking.   GERD -- Is currently on a regimen of Protonix 40 mg daily. Is taking as directed with great relief of symptoms. Notes if she does not take she will have significant reflux.   Health Maintenance: Immunizations -- Scheduled for COVID vaccine next Monday Colonoscopy -- UTD Mammogram -- UTD.   Past Medical History:  Diagnosis Date  . Anxiety   . Bartholin gland cyst   . CIN I (cervical intraepithelial neoplasia I) 1989   cryo...also in 2008 .. had cryo   . GERD (gastroesophageal reflux disease)   . Hyperlipidemia   . Hypertension     Past Surgical History:  Procedure Laterality Date  . ABDOMINAL HYSTERECTOMY  11/22/2011   Procedure: HYSTERECTOMY ABDOMINAL;  Surgeon: Terrance Mass, MD;  Location: [facility];  Service: Gynecology;  Laterality: N/A;  . BARTHOLIN CYST MARSUPIALIZATION Left 12/02/2018   Procedure: BARTHOLIN MARSUPIALIZATION OF LEFT GLAND;  Surgeon: Princess Bruins, MD;  Location: [facility];  Service: Gynecology;  Laterality: Left;  . colon polyps removed     . GYNECOLOGIC CRYOSURGERY  07/07/2007  . LAPAROSCOPIC LYSIS OF ADHESIONS  06/28/2015   Procedure: LAPAROSCOPIC LYSIS OF ABDOMINAL PELVIC ADHESIONS;  Surgeon: Terrance Mass, MD;  Location: [facility];  Service: Gynecology;;  . LAPAROSCOPIC UNILATERAL SALPINGO OOPHERECTOMY Right 06/28/2015   Procedure: LAPAROSCOPIC RIGHT SALPINGO OOPHORECTOMY;  Surgeon: Terrance Mass, MD;  Location: [facility];  Service: Gynecology;  Laterality: Right;  . LAPAROSCOPIC UNILATERAL SALPINGO OOPHERECTOMY Left 10/11/2016   Procedure: LAPAROSCOPIC UNILATERAL OOPHORECTOMY with pelvic washings;  Surgeon: Terrance Mass, MD;  Location: [facility];  Service: Gynecology;  Laterality: Left;  HARMONIC SCAPEL  . LAPAROSCOPY  11/22/2011   Procedure: LAPAROSCOPY DIAGNOSTIC;  Surgeon: Terrance Mass, MD;  Location: [facility];  Service: Gynecology;  Laterality: N/A;  . OVARIAN CYST REMOVAL  11/22/2011   Procedure: OVARIAN CYSTECTOMY;  Surgeon: Terrance Mass, MD;  Location: [facility];  Service: Gynecology;  Laterality: Left;  . SALPINGECTOMY     left for ectopic pregnancy  . TUBAL LIGATION     right tubal ligation    Current Outpatient Medications on File Prior to Visit  Medication Sig Dispense Refill  . atorvastatin (LIPITOR) 40 MG tablet Take 1 tablet (40 mg total) by mouth daily. Please schedule a physical 30 tablet 0  . azelastine (OPTIVAR) 0.05 % ophthalmic solution INSTILL 1 DROP INTO BOTH EYES TWICE A DAY 18 mL 0  . estradiol (CLIMARA - DOSED IN MG/24 HR) 0.1 mg/24hr patch PLACE 1 PATCH (0.1 MG TOTAL) ONTO THE SKIN ONCE A WEEK. 12 patch 1  . hydrochlorothiazide (HYDRODIURIL) 12.5  MG tablet Take 1 tablet (12.5 mg total) by mouth daily. Please schedule your physical. 30 tablet 0  . Multiple Vitamins-Minerals (WOMENS MULTIVITAMIN PLUS) TABS Take by mouth daily.    . pantoprazole (PROTONIX) 40 MG tablet TAKE 1 TABLET BY MOUTH EVERY DAY 30 tablet 3  . meclizine (ANTIVERT) 25 MG tablet Take 1 tablet (25 mg total) by mouth 3 (three)  times daily as needed for dizziness. (Patient not taking: Reported on 08/26/2019) 30 tablet 0   No current facility-administered medications on file prior to visit.    No Known Allergies  Family History  Problem Relation Age of Onset  . Diabetes Brother   . Ovarian cancer Mother   . Ovarian cancer Paternal Aunt   . Heart disease Maternal Grandmother   . Breast cancer Maternal Grandmother        Age 15's  . Breast cancer Maternal Aunt        Age 48's    Social History   Socioeconomic History  . Marital status: Married    Spouse name: Not on file  . Number of children: Not on file  . Years of education: Not on file  . Highest education level: Not on file  Occupational History  . Not on file  Tobacco Use  . Smoking status: Never Smoker  . Smokeless tobacco: Never Used  Substance and Sexual Activity  . Alcohol use: No    Alcohol/week: 0.0 standard drinks  . Drug use: No  . Sexual activity: Yes    Partners: Male    Birth control/protection: Surgical    Comment: 1st intercourse- 19, partners- 28, married- 12 yrs   Other Topics Concern  . Not on file  Social History Narrative  . Not on file   Social Determinants of Health   Financial Resource Strain:   . Difficulty of Paying Living Expenses:   Food Insecurity:   . Worried About Charity fundraiser in the Last Year:   . Arboriculturist in the Last Year:   Transportation Needs:   . Film/video editor (Medical):   Marland Kitchen Lack of Transportation (Non-Medical):   Physical Activity:   . Days of Exercise per Week:   . Minutes of Exercise per Session:   Stress:   . Feeling of Stress :   Social Connections:   . Frequency of Communication with Friends and Family:   . Frequency of Social Gatherings with Friends and Family:   . Attends Religious Services:   . Active Member of Clubs or Organizations:   . Attends Archivist Meetings:   Marland Kitchen Marital Status:   Intimate Partner Violence:   . Fear of Current or  Ex-Partner:   . Emotionally Abused:   Marland Kitchen Physically Abused:   . Sexually Abused:    Review of Systems  Constitutional: Negative for fever and weight loss.  HENT: Negative for ear discharge, ear pain, hearing loss and tinnitus.   Eyes: Negative for blurred vision, double vision, photophobia and pain.  Respiratory: Negative for cough and shortness of breath.   Cardiovascular: Negative for chest pain and palpitations.  Gastrointestinal: Negative for abdominal pain, blood in stool, constipation, diarrhea, heartburn, melena, nausea and vomiting.  Genitourinary: Negative for dysuria, flank pain, frequency, hematuria and urgency.  Musculoskeletal: Positive for joint pain. Negative for falls.  Neurological: Negative for dizziness, loss of consciousness and headaches.  Endo/Heme/Allergies: Negative for environmental allergies.  Psychiatric/Behavioral: Negative for depression, hallucinations, substance abuse and suicidal ideas. The patient is  not nervous/anxious and does not have insomnia.    BP 122/80   Pulse 78   Temp 98.3 F (36.8 C) (Temporal)   Resp 16   Ht 5\' 3"  (1.6 m)   Wt 222 lb (100.7 kg)   LMP 11/10/2011   SpO2 99%   BMI 39.33 kg/m   Physical Exam Vitals reviewed.  Constitutional:      Appearance: Normal appearance.  HENT:     Head: Normocephalic and atraumatic.     Right Ear: Tympanic membrane, ear canal and external ear normal.     Left Ear: Tympanic membrane, ear canal and external ear normal.     Nose: Nose normal. No mucosal edema.     Mouth/Throat:     Pharynx: Uvula midline. No oropharyngeal exudate or posterior oropharyngeal erythema.  Eyes:     Conjunctiva/sclera: Conjunctivae normal.     Pupils: Pupils are equal, round, and reactive to light.  Neck:     Thyroid: No thyromegaly.  Cardiovascular:     Rate and Rhythm: Normal rate and regular rhythm.     Pulses: Normal pulses.          Popliteal pulses are 2+ on the right side and 2+ on the left side.     Heart  sounds: Normal heart sounds.  Pulmonary:     Effort: Pulmonary effort is normal. No respiratory distress.     Breath sounds: Normal breath sounds. No wheezing or rales.  Abdominal:     General: Bowel sounds are normal. There is no distension.     Palpations: Abdomen is soft. There is no mass.     Tenderness: There is no abdominal tenderness. There is no guarding or rebound.  Musculoskeletal:     Cervical back: Neck supple.     Right upper leg: Normal.     Left upper leg: Normal.     Right knee: No swelling, deformity, effusion, bony tenderness or crepitus. Normal range of motion. Tenderness present over the medial joint line.     Left knee: No swelling, deformity, effusion, bony tenderness or crepitus. Normal range of motion. Tenderness present over the medial joint line.     Right lower leg: Normal.     Left lower leg: Normal.  Lymphadenopathy:     Cervical: No cervical adenopathy.  Skin:    General: Skin is warm and dry.     Findings: No rash.  Neurological:     Mental Status: She is alert and oriented to person, place, and time.     Cranial Nerves: No cranial nerve deficit.     Sensory: Sensation is intact.    Assessment/Plan: 1. Visit for preventive health examination Depression screen negative. Health Maintenance reviewed. Preventive schedule discussed and handout given in AVS. Will obtain fasting labs today.  - CBC with Differential/Platelet - Comprehensive metabolic panel - Lipid panel - Hemoglobin A1c  2. Essential hypertension BP normotensive. Asymptomatic. Continue current regimen. Dietary and exercise recommendations reviewed with the patient.  Medications refilled.  Repeat fasting lipid panel and CMP today. - hydrochlorothiazide (HYDRODIURIL) 12.5 MG tablet; Take 1 tablet (12.5 mg total) by mouth daily.  Dispense: 90 tablet; Refill: 1 - Comprehensive metabolic panel - Lipid panel  3. Hyperlipidemia, unspecified hyperlipidemia type Continue statin.  Reviewed  dietary and exercise recommendations with patient.  Recommend for nighttime cravings to drink a small glass of water and find another activity to engage in for 5 to 10 minutes to allow the cramping to pass.  This will  hopefully help with her late night snacking.  Fasting labs today. - Lipid panel - Hemoglobin A1c  4. Gastroesophageal reflux disease without esophagitis Stable.  Continue current regimen.  Hopefully with change in diet and exercise we will noticed some weight loss and can reassess lowering her dose at that time.  5. Chronic pain of both knees Some medial joint line tenderness bilaterally.  Likely arthritic in nature.  Topical Voltaren recommended.  Given chronicity and worsening of symptoms will refer to orthopedics for further ration and management. - Ambulatory referral to Orthopedic Surgery  This visit occurred during the SARS-CoV-2 public health emergency.  Safety protocols were in place, including screening questions prior to the visit, additional usage of staff PPE, and extensive cleaning of exam room while observing appropriate contact time as indicated for disinfecting solutions.     Leeanne Rio, PA-C

## 2020-03-23 NOTE — Patient Instructions (Signed)
Please go to the lab for blood work.   Our office will call you with your results unless you have chosen to receive results via MyChart.  If your blood work is normal we will follow-up each year for physicals and as scheduled for chronic medical problems.  If anything is abnormal we will treat accordingly and get you in for a follow-up.  Start some OTC Voltaren gel to the knees. I want you to work on cutting back on nighttime snacking using the recommendations I have given.   Continue chronic medications as directed.   You will be contacted for further assessment by Orthopedics.    Preventive Care 17-75 Years Old, Female Preventive care refers to visits with your health care provider and lifestyle choices that can promote health and wellness. This includes:  A yearly physical exam. This may also be called an annual well check.  Regular dental visits and eye exams.  Immunizations.  Screening for certain conditions.  Healthy lifestyle choices, such as eating a healthy diet, getting regular exercise, not using drugs or products that contain nicotine and tobacco, and limiting alcohol use. What can I expect for my preventive care visit? Physical exam Your health care provider will check your:  Height and weight. This may be used to calculate body mass index (BMI), which tells if you are at a healthy weight.  Heart rate and blood pressure.  Skin for abnormal spots. Counseling Your health care provider may ask you questions about your:  Alcohol, tobacco, and drug use.  Emotional well-being.  Home and relationship well-being.  Sexual activity.  Eating habits.  Work and work Statistician.  Method of birth control.  Menstrual cycle.  Pregnancy history. What immunizations do I need?  Influenza (flu) vaccine  This is recommended every year. Tetanus, diphtheria, and pertussis (Tdap) vaccine  You may need a Td booster every 10 years. Varicella (chickenpox)  vaccine  You may need this if you have not been vaccinated. Zoster (shingles) vaccine  You may need this after age 52. Measles, mumps, and rubella (MMR) vaccine  You may need at least one dose of MMR if you were born in 1957 or later. You may also need a second dose. Pneumococcal conjugate (PCV13) vaccine  You may need this if you have certain conditions and were not previously vaccinated. Pneumococcal polysaccharide (PPSV23) vaccine  You may need one or two doses if you smoke cigarettes or if you have certain conditions. Meningococcal conjugate (MenACWY) vaccine  You may need this if you have certain conditions. Hepatitis A vaccine  You may need this if you have certain conditions or if you travel or work in places where you may be exposed to hepatitis A. Hepatitis B vaccine  You may need this if you have certain conditions or if you travel or work in places where you may be exposed to hepatitis B. Haemophilus influenzae type b (Hib) vaccine  You may need this if you have certain conditions. Human papillomavirus (HPV) vaccine  If recommended by your health care provider, you may need three doses over 6 months. You may receive vaccines as individual doses or as more than one vaccine together in one shot (combination vaccines). Talk with your health care provider about the risks and benefits of combination vaccines. What tests do I need? Blood tests  Lipid and cholesterol levels. These may be checked every 5 years, or more frequently if you are over 74 years old.  Hepatitis C test.  Hepatitis B test. Screening  Lung cancer screening. You may have this screening every year starting at age 52 if you have a 30-pack-year history of smoking and currently smoke or have quit within the past 15 years.  Colorectal cancer screening. All adults should have this screening starting at age 52 and continuing until age 29. Your health care provider may recommend screening at age 52 if you  are at increased risk. You will have tests every 1-10 years, depending on your results and the type of screening test.  Diabetes screening. This is done by checking your blood sugar (glucose) after you have not eaten for a while (fasting). You may have this done every 1-3 years.  Mammogram. This may be done every 1-2 years. Talk with your health care provider about when you should start having regular mammograms. This may depend on whether you have a family history of breast cancer.  BRCA-related cancer screening. This may be done if you have a family history of breast, ovarian, tubal, or peritoneal cancers.  Pelvic exam and Pap test. This may be done every 3 years starting at age 52. Starting at age 46, this may be done every 5 years if you have a Pap test in combination with an HPV test. Other tests  Sexually transmitted disease (STD) testing.  Bone density scan. This is done to screen for osteoporosis. You may have this scan if you are at high risk for osteoporosis. Follow these instructions at home: Eating and drinking  Eat a diet that includes fresh fruits and vegetables, whole grains, lean protein, and low-fat dairy.  Take vitamin and mineral supplements as recommended by your health care provider.  Do not drink alcohol if: ? Your health care provider tells you not to drink. ? You are pregnant, may be pregnant, or are planning to become pregnant.  If you drink alcohol: ? Limit how much you have to 0-1 drink a day. ? Be aware of how much alcohol is in your drink. In the [place] one drink equals one 12 oz bottle of beer (355 mL), one 5 oz glass of wine (148 mL), or one 1 oz glass of hard liquor (44 mL). Lifestyle  Take daily care of your teeth and gums.  Stay active. Exercise for at least 30 minutes on 5 or more days each week.  Do not use any products that contain nicotine or tobacco, such as cigarettes, e-cigarettes, and chewing tobacco. If you need help quitting, ask your  health care provider.  If you are sexually active, practice safe sex. Use a condom or other form of birth control (contraception) in order to prevent pregnancy and STIs (sexually transmitted infections).  If told by your health care provider, take low-dose aspirin daily starting at age 58. What's next?  Visit your health care provider once a year for a well check visit.  Ask your health care provider how often you should have your eyes and teeth checked.  Stay up to date on all vaccines. This information is not intended to replace advice given to you by your health care provider. Make sure you discuss any questions you have with your health care provider. Document Revised: 06/26/2018 Document Reviewed: 06/26/2018 Elsevier Patient Education  [employer].   .

## 2020-03-25 ENCOUNTER — Other Ambulatory Visit: Payer: Self-pay | Admitting: Emergency Medicine

## 2020-03-25 DIAGNOSIS — E785 Hyperlipidemia, unspecified: Secondary | ICD-10-CM

## 2020-03-25 DIAGNOSIS — K219 Gastro-esophageal reflux disease without esophagitis: Secondary | ICD-10-CM

## 2020-03-25 MED ORDER — PANTOPRAZOLE SODIUM 40 MG PO TBEC: 40.0000 mg | DELAYED_RELEASE_TABLET | Freq: Every day | ORAL | 2 refills | Status: DC

## 2020-03-25 MED ORDER — ATORVASTATIN CALCIUM 40 MG PO TABS: 40.0000 mg | ORAL_TABLET | Freq: Every day | ORAL | 2 refills | Status: DC

## 2020-04-05 ENCOUNTER — Ambulatory Visit: Payer: BC Managed Care – PPO | Admitting: Orthopaedic Surgery

## 2020-04-13 ENCOUNTER — Telehealth: Payer: Self-pay | Admitting: Orthopaedic Surgery

## 2020-04-13 NOTE — Telephone Encounter (Signed)
Called patient left message to return call to reschedule appt with Dr. Erlinda Hong per patient request in [place]

## 2020-05-27 ENCOUNTER — Other Ambulatory Visit: Payer: Self-pay | Admitting: Obstetrics & Gynecology

## 2020-08-07 ENCOUNTER — Other Ambulatory Visit: Payer: Self-pay | Admitting: Physician Assistant

## 2020-08-07 ENCOUNTER — Other Ambulatory Visit: Payer: Self-pay | Admitting: Obstetrics & Gynecology

## 2020-08-07 DIAGNOSIS — I10 Essential (primary) hypertension: Secondary | ICD-10-CM

## 2020-09-26 ENCOUNTER — Other Ambulatory Visit: Payer: Self-pay | Admitting: Physician Assistant

## 2020-09-26 DIAGNOSIS — I10 Essential (primary) hypertension: Secondary | ICD-10-CM

## 2020-12-25 ENCOUNTER — Other Ambulatory Visit: Payer: Self-pay | Admitting: Physician Assistant

## 2020-12-25 DIAGNOSIS — E785 Hyperlipidemia, unspecified: Secondary | ICD-10-CM

## 2021-01-20 ENCOUNTER — Other Ambulatory Visit: Payer: Self-pay | Admitting: Physician Assistant

## 2021-01-20 DIAGNOSIS — K219 Gastro-esophageal reflux disease without esophagitis: Secondary | ICD-10-CM

## 2021-03-27 ENCOUNTER — Other Ambulatory Visit: Payer: Self-pay | Admitting: Family

## 2021-03-27 DIAGNOSIS — E785 Hyperlipidemia, unspecified: Secondary | ICD-10-CM

## 2021-03-29 ENCOUNTER — Other Ambulatory Visit: Payer: Self-pay

## 2021-03-29 DIAGNOSIS — I10 Essential (primary) hypertension: Secondary | ICD-10-CM

## 2021-03-29 MED ORDER — HYDROCHLOROTHIAZIDE 12.5 MG PO TABS: 12.5000 mg | ORAL_TABLET | Freq: Every day | ORAL | 0 refills | Status: DC

## 2021-04-12 ENCOUNTER — Other Ambulatory Visit: Payer: Self-pay

## 2021-04-13 ENCOUNTER — Encounter: Payer: Self-pay | Admitting: Nurse Practitioner

## 2021-04-13 ENCOUNTER — Other Ambulatory Visit (HOSPITAL_COMMUNITY)
Admission: RE | Admit: 2021-04-13 | Discharge: 2021-04-13 | Disposition: A | Discharge status: Home / Self Care | Payer: 59 | Source: Ambulatory Visit | Attending: Nurse Practitioner | Admitting: Nurse Practitioner

## 2021-04-13 ENCOUNTER — Ambulatory Visit (INDEPENDENT_AMBULATORY_CARE_PROVIDER_SITE_OTHER): Payer: 59 | Admitting: Nurse Practitioner

## 2021-04-13 VITALS — BP 122/78 | HR 88 | Temp 98.6°F | Ht 63.0 in | Wt 217.6 lb

## 2021-04-13 DIAGNOSIS — L304 Erythema intertrigo: Secondary | ICD-10-CM | POA: Insufficient documentation

## 2021-04-13 DIAGNOSIS — I10 Essential (primary) hypertension: Secondary | ICD-10-CM | POA: Diagnosis not present

## 2021-04-13 DIAGNOSIS — Z0001 Encounter for general adult medical examination with abnormal findings: Secondary | ICD-10-CM | POA: Diagnosis not present

## 2021-04-13 DIAGNOSIS — Z124 Encounter for screening for malignant neoplasm of cervix: Secondary | ICD-10-CM

## 2021-04-13 DIAGNOSIS — R739 Hyperglycemia, unspecified: Secondary | ICD-10-CM | POA: Insufficient documentation

## 2021-04-13 DIAGNOSIS — K5904 Chronic idiopathic constipation: Secondary | ICD-10-CM | POA: Insufficient documentation

## 2021-04-13 DIAGNOSIS — Z1231 Encounter for screening mammogram for malignant neoplasm of breast: Secondary | ICD-10-CM

## 2021-04-13 DIAGNOSIS — Z800 Family history of malignant neoplasm of digestive organs: Secondary | ICD-10-CM | POA: Insufficient documentation

## 2021-04-13 DIAGNOSIS — E785 Hyperlipidemia, unspecified: Secondary | ICD-10-CM | POA: Diagnosis not present

## 2021-04-13 DIAGNOSIS — N951 Menopausal and female climacteric states: Secondary | ICD-10-CM

## 2021-04-13 DIAGNOSIS — Z8049 Family history of malignant neoplasm of other genital organs: Secondary | ICD-10-CM | POA: Insufficient documentation

## 2021-04-13 HISTORY — DX: Erythema intertrigo: L30.4

## 2021-04-13 MED ORDER — NYSTATIN-TRIAMCINOLONE 100000-0.1 UNIT/GM-% EX OINT: 1.0000 "application " | TOPICAL_OINTMENT | Freq: Two times a day (BID) | CUTANEOUS | 0 refills | Status: DC

## 2021-04-13 MED ORDER — ESTRADIOL 0.1 MG/24HR TD PTWK: 0.1000 mg | MEDICATED_PATCH | TRANSDERMAL | 1 refills | Status: DC

## 2021-04-13 NOTE — Assessment & Plan Note (Signed)
Repeat lipid panel continue atorvastatin

## 2021-04-13 NOTE — Assessment & Plan Note (Signed)
BP at goal with HCTZ BP Readings from Last 3 Encounters:  04/13/21 122/78  03/23/20 122/80  08/26/19 102/80   Repeat CMP maintain current medications

## 2021-04-13 NOTE — Assessment & Plan Note (Signed)
Mother at age 53, deceased at age 39 Maternal aunts with unknown GI/GU cancer, both deceased s/p hysterectomy and oophrectomy 2017, benign pathology No hx of abnormal PAP nor positive HPV. Even though guidelines recommend discontinuation of PAP smear, Ms. Faust wants continue screen due to her family. It will be const effective to continue PAP smear every 60yrs instead of sending for genetic testing.

## 2021-04-13 NOTE — Progress Notes (Signed)
Subjective:    Patient ID: Madeline Hawkins, female    DOB: [birth year], 55 y.o.   MRN: [id number]  Patient presents today for CPE and eval of chronic conditions  HPI HTN (hypertension) BP at goal with HCTZ BP Readings from Last 3 Encounters:  04/13/21 122/78  03/23/20 122/80  08/26/19 102/80   Repeat CMP maintain current medications  Intertrigo Under bilateral breasts, chronic, associated with itching Worse with increased sweating  mycolog rx sent, use x 1week then switching to miconazole powder prn  Hyperlipidemia Repeat lipid panel continue atorvastatin  Hyperglycemia Last hgbA1c of 6.1% States she has made dietary changes (low carb and low fat) Repeat hgbA1c today  Family history of cervical cancer Mother at age 33, deceased at age 21 Maternal aunts with unknown GI/GU cancer, both deceased s/p hysterectomy and oophrectomy 2017, benign pathology No hx of abnormal PAP nor positive HPV. Even though guidelines recommend discontinuation of PAP smear, Ms. Dunton wants continue screen due to her family. It will be const effective to continue PAP smear every 14yrs instead of sending for genetic testing.  Sexual History (orientation,birth control, marital status, STD):no need for STD screen, needs pelvic and breast exam  Depression/Suicide: Depression screen [facility] 2/9 04/13/2021 03/23/2020 02/10/2019 01/23/2018 02/04/2017 02/04/2017 02/08/2016  Decreased Interest 0 0 0 0 0 0 0  Down, Depressed, Hopeless 0 0 0 0 0 0 0  PHQ - 2 Score 0 0 0 0 0 0 0  Altered sleeping 1 - 0 0 - 2 -  Tired, decreased energy 1 - 0 0 - 2 -  Change in appetite 1 - 0 0 - 0 -  Feeling bad or failure about yourself  0 - 0 0 - 0 -  Trouble concentrating 0 - 0 0 - 0 -  Moving slowly or fidgety/restless 0 - 0 0 - 0 -  Suicidal thoughts 0 - 0 0 - 0 -  PHQ-9 Score 3 - 0 0 - 4 -  Difficult doing work/chores Not difficult at all - Not difficult at all Not difficult at all - - -   Vision:up to date  Dental: up  to date  Immunizations: (TDAP, Hep C screen, Pneumovax, Influenza, zoster)  Health Maintenance  Topic Date Due   COVID-19 Vaccine (1) Never done   Mammogram  03/23/2021   Zoster (Shingles) Vaccine (1 of 2) 07/14/2021*   Hepatitis C Screening: USPSTF Recommendation to screen - Ages 18-79 yo.  04/13/2022*   Flu Shot  05/29/2021   Tetanus Vaccine  03/15/2024   Colon Cancer Screening  04/05/2024   HIV Screening  Completed   Pneumococcal Vaccination  Aged Out   HPV Vaccine  Aged Out  *Topic was postponed. The date shown is not the original due date.   Diet:DASH diet Exercise: none Weight:  Wt Readings from Last 3 Encounters:  04/13/21 217 lb 9.6 oz (98.7 kg)  03/23/20 222 lb (100.7 kg)  08/26/19 219 lb (99.3 kg)   Fall Risk: Fall Risk  04/13/2021 02/10/2019 11/04/2018 02/04/2017 02/08/2016 02/01/2016  Falls in the past year? 0 0 0 No No No  Number falls in past yr: 0 0 0 - - -  Injury with Fall? 0 0 0 - - -  Follow up - Falls evaluation completed Falls evaluation completed - - -   Advanced Directive: Advanced Directives 03/31/2018  Does Patient Have a Medical Advance Directive? No  Would patient like information on creating a medical advance directive? No - Patient declined  Pre-existing out of facility DNR order (yellow form or pink MOST form) -    Medications and allergies reviewed with patient and updated if appropriate.  Patient Active Problem List   Diagnosis Date Noted   Chronic idiopathic constipation 04/13/2021   Family history of malignant neoplasm of gastrointestinal tract 04/13/2021   Family history of cervical cancer 04/13/2021   Intertrigo 04/13/2021   Hyperglycemia 04/13/2021   Vasomotor symptoms due to menopause 04/13/2021   Ovarian cyst, left 08/22/2016   Gastroesophageal reflux disease 02/01/2016   Adjustment disorder with depressed mood 08/05/2015   Hyperlipidemia 11/14/2012   HTN (hypertension) 02/04/2012   Anxiety 02/04/2012   Anemia 11/13/2011   Current  Outpatient Medications on File Prior to Visit  Medication Sig Dispense Refill   atorvastatin (LIPITOR) 40 MG tablet Take 1 tablet (40 mg total) by mouth daily. Please schedule an appointment with new provider for further refills 90 tablet 0   azelastine (OPTIVAR) 0.05 % ophthalmic solution INSTILL 1 DROP INTO BOTH EYES TWICE A DAY 18 mL 0   hydrochlorothiazide (HYDRODIURIL) 12.5 MG tablet Take 1 tablet (12.5 mg total) by mouth daily. 90 tablet 0   meclizine (ANTIVERT) 25 MG tablet Take 1 tablet (25 mg total) by mouth 3 (three) times daily as needed for dizziness. 30 tablet 0   Multiple Vitamins-Minerals (WOMENS MULTIVITAMIN PLUS) TABS Take by mouth daily.     pantoprazole (PROTONIX) 40 MG tablet Take 1 tablet (40 mg total) by mouth daily. 90 tablet 2   No current facility-administered medications on file prior to visit.    Past Medical History:  Diagnosis Date   Anxiety    Bartholin gland cyst    CIN I (cervical intraepithelial neoplasia I) 1989   cryo...also in 2008 .. had cryo    GERD (gastroesophageal reflux disease)    Hyperlipidemia    Hypertension    Past Surgical History:  Procedure Laterality Date   ABDOMINAL HYSTERECTOMY  11/22/2011   Procedure: HYSTERECTOMY ABDOMINAL;  Surgeon: Terrance Mass, MD;  Location: [facility];  Service: Gynecology;  Laterality: N/A;   BARTHOLIN CYST MARSUPIALIZATION Left 12/02/2018   Procedure: BARTHOLIN MARSUPIALIZATION OF LEFT GLAND;  Surgeon: Princess Bruins, MD;  Location: [facility];  Service: Gynecology;  Laterality: Left;   colon polyps removed     GYNECOLOGIC CRYOSURGERY  07/07/2007   LAPAROSCOPIC LYSIS OF ADHESIONS  06/28/2015   Procedure: LAPAROSCOPIC LYSIS OF ABDOMINAL PELVIC ADHESIONS;  Surgeon: Terrance Mass, MD;  Location: [facility];  Service: Gynecology;;   LAPAROSCOPIC UNILATERAL SALPINGO OOPHERECTOMY Right 06/28/2015   Procedure: LAPAROSCOPIC RIGHT SALPINGO OOPHORECTOMY;  Surgeon: Terrance Mass, MD;  Location: [facility];  Service: Gynecology;  Laterality: Right;   LAPAROSCOPIC UNILATERAL SALPINGO OOPHERECTOMY Left 10/11/2016   Procedure: LAPAROSCOPIC UNILATERAL OOPHORECTOMY with pelvic washings;  Surgeon: Terrance Mass, MD;  Location: [facility];  Service: Gynecology;  Laterality: Left;  HARMONIC SCAPEL   LAPAROSCOPY  11/22/2011   Procedure: LAPAROSCOPY DIAGNOSTIC;  Surgeon: Terrance Mass, MD;  Location: [facility];  Service: Gynecology;  Laterality: N/A;   OVARIAN CYST REMOVAL  11/22/2011   Procedure: OVARIAN CYSTECTOMY;  Surgeon: Terrance Mass, MD;  Location: [facility];  Service: Gynecology;  Laterality: Left;   SALPINGECTOMY     left for ectopic pregnancy   TUBAL LIGATION     right tubal ligation   Social History   Socioeconomic History   Marital status: Married    Spouse name: Not on file   Number of  children: Not on file   Years of education: Not on file   Highest education level: Not on file  Occupational History   Not on file  Tobacco Use   Smoking status: Never   Smokeless tobacco: Never  Vaping Use   Vaping Use: Never used  Substance and Sexual Activity   Alcohol use: No    Alcohol/week: 0.0 standard drinks   Drug use: No   Sexual activity: Yes    Partners: Male    Birth control/protection: Surgical    Comment: 1st intercourse- 19, partners- 33, married- 63 yrs   Other Topics Concern   Not on file  Social History Narrative   Not on file   Social Determinants of Health   Financial Resource Strain: Not on file  Food Insecurity: Not on file  Transportation Needs: Not on file  Physical Activity: Not on file  Stress: Not on file  Social Connections: Not on file    Family History  Problem Relation Age of Onset   Diabetes Brother    Ovarian cancer Mother    Ovarian cancer Paternal Aunt    Heart disease Maternal Grandmother    Breast cancer Maternal Grandmother        Age 37's   Breast cancer Maternal Aunt        Age 20's       Review of Systems  Constitutional:   Negative for fever, malaise/fatigue and weight loss.  HENT:  Negative for congestion and sore throat.   Eyes:        Negative for visual changes  Respiratory:  Negative for cough and shortness of breath.   Cardiovascular:  Negative for chest pain, palpitations and leg swelling.  Gastrointestinal:  Negative for blood in stool, constipation, diarrhea and heartburn.  Genitourinary:  Negative for dysuria, frequency and urgency.  Musculoskeletal:  Negative for falls, joint pain and myalgias.  Skin:  Negative for rash.  Neurological:  Negative for dizziness, sensory change and headaches.  Endo/Heme/Allergies:  Does not bruise/bleed easily.  Psychiatric/Behavioral:  Negative for depression, substance abuse and suicidal ideas. The patient is not nervous/anxious.    Objective:   Vitals:   04/13/21 1129  BP: 122/78  Pulse: 88  Temp: 98.6 F (37 C)  SpO2: 96%    Body mass index is 38.55 kg/m.   Physical Examination:  Physical Exam Vitals reviewed. Exam conducted with a chaperone present.  Constitutional:      General: She is not in acute distress.    Appearance: She is well-developed. She is obese.  HENT:     Right Ear: Tympanic membrane, ear canal and external ear normal.     Left Ear: Tympanic membrane, ear canal and external ear normal.  Eyes:     Extraocular Movements: Extraocular movements intact.     Conjunctiva/sclera: Conjunctivae normal.  Cardiovascular:     Rate and Rhythm: Normal rate and regular rhythm.     Heart sounds: Normal heart sounds.  Pulmonary:     Effort: Pulmonary effort is normal. No respiratory distress.     Breath sounds: Normal breath sounds.  Chest:     Chest wall: No tenderness.  Breasts:    Right: Normal. No axillary adenopathy or supraclavicular adenopathy.     Left: Normal. No axillary adenopathy or supraclavicular adenopathy.  Abdominal:     General: Bowel sounds are normal.     Palpations: Abdomen is soft.     Hernia: There is no hernia  in the left inguinal area  or right inguinal area.  Genitourinary:    General: Normal vulva.     Labia:        Right: No rash or tenderness.        Left: No rash, tenderness or lesion.      Vagina: No vaginal discharge, erythema, tenderness or lesions.     Uterus: Normal.      Adnexa: Right adnexa normal and left adnexa normal.     Comments: Cervix absent, cuff intact Musculoskeletal:        General: Normal range of motion.  Lymphadenopathy:     Upper Body:     Right upper body: No supraclavicular, axillary or pectoral adenopathy.     Left upper body: No supraclavicular, axillary or pectoral adenopathy.     Lower Body: No right inguinal adenopathy. No left inguinal adenopathy.  Skin:    General: Skin is warm and dry.  Neurological:     Mental Status: She is alert and oriented to person, place, and time.     Deep Tendon Reflexes: Reflexes are normal and symmetric.  Psychiatric:        Mood and Affect: Mood normal.        Behavior: Behavior normal.        Thought Content: Thought content normal.    ASSESSMENT and PLAN: This visit occurred during the SARS-CoV-2 public health emergency.  Safety protocols were in place, including screening questions prior to the visit, additional usage of staff PPE, and extensive cleaning of exam room while observing appropriate contact time as indicated for disinfecting solutions.   Farida was seen today for annual exam.  Diagnoses and all orders for this visit:  Encounter for preventative adult health care exam with abnormal findings -     Cytology - PAP( [place]); Future -     Comprehensive metabolic panel; Future -     TSH; Future -     CBC; Future -     MM 3D SCREEN BREAST BILATERAL; Future  Primary hypertension  Hyperlipidemia, unspecified hyperlipidemia type -     Lipid panel; Future  Cervical cancer screening -     Cytology - PAP( [place]); Future  Intertrigo -     nystatin-triamcinolone ointment (MYCOLOG); Apply 1  application topically 2 (two) times daily.  Family history of cervical cancer -     Cytology - PAP( [place]); Future  Vasomotor symptoms due to menopause -     estradiol (CLIMARA - DOSED IN MG/24 HR) 0.1 mg/24hr patch; Place 1 patch (0.1 mg total) onto the skin once a week.  Breast cancer screening by mammogram -     MM 3D SCREEN BREAST BILATERAL; Future  Hyperglycemia -     Hemoglobin A1c; Future       Problem List Items Addressed This Visit       Cardiovascular and Mediastinum   HTN (hypertension)    BP at goal with HCTZ BP Readings from Last 3 Encounters:  04/13/21 122/78  03/23/20 122/80  08/26/19 102/80   Repeat CMP maintain current medications         Musculoskeletal and Integument   Intertrigo    Under bilateral breasts, chronic, associated with itching Worse with increased sweating  mycolog rx sent, use x 1week then switching to miconazole powder prn       Relevant Medications   nystatin-triamcinolone ointment (MYCOLOG)     Other   Family history of cervical cancer    Mother at age 33, deceased at  age 59 Maternal aunts with unknown GI/GU cancer, both deceased s/p hysterectomy and oophrectomy 2017, benign pathology No hx of abnormal PAP nor positive HPV. Even though guidelines recommend discontinuation of PAP smear, Ms. Gardiner wants continue screen due to her family. It will be const effective to continue PAP smear every 86yrs instead of sending for genetic testing.        Relevant Orders   Cytology - PAP( [place])   Hyperglycemia    Last hgbA1c of 6.1% States she has made dietary changes (low carb and low fat) Repeat hgbA1c today       Relevant Orders   Hemoglobin A1c   Hyperlipidemia    Repeat lipid panel continue atorvastatin       Relevant Orders   Lipid panel   Vasomotor symptoms due to menopause   Relevant Medications   estradiol (CLIMARA - DOSED IN MG/24 HR) 0.1 mg/24hr patch   Other Visit Diagnoses     Encounter  for preventative adult health care exam with abnormal findings    -  Primary   Relevant Orders   Cytology - PAP( [place])   Comprehensive metabolic panel   TSH   CBC   MM 3D SCREEN BREAST BILATERAL   Cervical cancer screening       Relevant Orders   Cytology - PAP( [place])   Breast cancer screening by mammogram       Relevant Orders   MM 3D SCREEN BREAST BILATERAL       Follow up: Return in about 6 months (around 10/13/2021) for HTN and , hyperlipidemia.  Wilfred Lacy, NP

## 2021-04-13 NOTE — Assessment & Plan Note (Signed)
Under bilateral breasts, chronic, associated with itching Worse with increased sweating  mycolog rx sent, use x 1week then switching to miconazole powder prn

## 2021-04-13 NOTE — Patient Instructions (Addendum)
Alternate between mycolog ointment and miconazole powder to manage rash under breast.  Schedule lab appt. You need to be fasting 6-8hrs prior to blood draw.  You will be contacted to schedule appt for mammogram.  Start daily exercise to help improve overall health.  Preventive Care 73-53 Years Old, Female Preventive care refers to lifestyle choices and visits with your health care provider that can promote health and wellness. This includes: A yearly physical exam. This is also called an annual wellness visit. Regular dental and eye exams. Immunizations. Screening for certain conditions. Healthy lifestyle choices, such as: Eating a healthy diet. Getting regular exercise. Not using drugs or products that contain nicotine and tobacco. Limiting alcohol use. What can I expect for my preventive care visit? Physical exam Your health care provider will check your: Height and weight. These may be used to calculate your BMI (body mass index). BMI is a measurement that tells if you are at a healthy weight. Heart rate and blood pressure. Body temperature. Skin for abnormal spots. Counseling Your health care provider may ask you questions about your: Past medical problems. Family's medical history. Alcohol, tobacco, and drug use. Emotional well-being. Home life and relationship well-being. Sexual activity. Diet, exercise, and sleep habits. Work and work Statistician. Access to firearms. Method of birth control. Menstrual cycle. Pregnancy history. What immunizations do I need?  Vaccines are usually given at various ages, according to a schedule. Your health care provider will recommend vaccines for you based on your age, medicalhistory, and lifestyle or other factors, such as travel or where you work. What tests do I need? Blood tests Lipid and cholesterol levels. These may be checked every 5 years, or more often if you are over 73 years old. Hepatitis C test. Hepatitis B  test. Screening Lung cancer screening. You may have this screening every year starting at age 61 if you have a 30-pack-year history of smoking and currently smoke or have quit within the past 15 years. Colorectal cancer screening. All adults should have this screening starting at age 2 and continuing until age 36. Your health care provider may recommend screening at age 73 if you are at increased risk. You will have tests every 1-10 years, depending on your results and the type of screening test. Diabetes screening. This is done by checking your blood sugar (glucose) after you have not eaten for a while (fasting). You may have this done every 1-3 years. Mammogram. This may be done every 1-2 years. Talk with your health care provider about when you should start having regular mammograms. This may depend on whether you have a family history of breast cancer. BRCA-related cancer screening. This may be done if you have a family history of breast, ovarian, tubal, or peritoneal cancers. Pelvic exam and Pap test. This may be done every 3 years starting at age 32. Starting at age 35, this may be done every 5 years if you have a Pap test in combination with an HPV test. Other tests STD (sexually transmitted disease) testing, if you are at risk. Bone density scan. This is done to screen for osteoporosis. You may have this scan if you are at high risk for osteoporosis. Talk with your health care provider about your test results, treatment options,and if necessary, the need for more tests. Follow these instructions at home: Eating and drinking  Eat a diet that includes fresh fruits and vegetables, whole grains, lean protein, and low-fat dairy products. Take vitamin and mineral supplements as recommended by  your health care provider. Do not drink alcohol if: Your health care provider tells you not to drink. You are pregnant, may be pregnant, or are planning to become pregnant. If you drink  alcohol: Limit how much you have to 0-1 drink a day. Be aware of how much alcohol is in your drink. In the [place] one drink equals one 12 oz bottle of beer (355 mL), one 5 oz glass of wine (148 mL), or one 1 oz glass of hard liquor (44 mL).  Lifestyle Take daily care of your teeth and gums. Brush your teeth every morning and night with fluoride toothpaste. Floss one time each day. Stay active. Exercise for at least 30 minutes 5 or more days each week. Do not use any products that contain nicotine or tobacco, such as cigarettes, e-cigarettes, and chewing tobacco. If you need help quitting, ask your health care provider. Do not use drugs. If you are sexually active, practice safe sex. Use a condom or other form of protection to prevent STIs (sexually transmitted infections). If you do not wish to become pregnant, use a form of birth control. If you plan to become pregnant, see your health care provider for a prepregnancy visit. If told by your health care provider, take low-dose aspirin daily starting at age 61. Find healthy ways to cope with stress, such as: Meditation, yoga, or listening to music. Journaling. Talking to a trusted person. Spending time with friends and family. Safety Always wear your seat belt while driving or riding in a vehicle. Do not drive: If you have been drinking alcohol. Do not ride with someone who has been drinking. When you are tired or distracted. While texting. Wear a helmet and other protective equipment during sports activities. If you have firearms in your house, make sure you follow all gun safety procedures. What's next? Visit your health care provider once a year for an annual wellness visit. Ask your health care provider how often you should have your eyes and teeth checked. Stay up to date on all vaccines. This information is not intended to replace advice given to you by your health care provider. Make sure you discuss any questions you have with  your healthcare provider. Document Revised: 07/19/2020 Document Reviewed: 06/26/2018 Elsevier Patient Education  2022 [employer].

## 2021-04-13 NOTE — Assessment & Plan Note (Signed)
Last hgbA1c of 6.1% States she has made dietary changes (low carb and low fat) Repeat hgbA1c today

## 2021-04-14 ENCOUNTER — Other Ambulatory Visit (INDEPENDENT_AMBULATORY_CARE_PROVIDER_SITE_OTHER): Payer: 59

## 2021-04-14 ENCOUNTER — Other Ambulatory Visit: Payer: Self-pay

## 2021-04-14 DIAGNOSIS — R739 Hyperglycemia, unspecified: Secondary | ICD-10-CM | POA: Diagnosis not present

## 2021-04-14 DIAGNOSIS — E785 Hyperlipidemia, unspecified: Secondary | ICD-10-CM | POA: Diagnosis not present

## 2021-04-14 DIAGNOSIS — Z0001 Encounter for general adult medical examination with abnormal findings: Secondary | ICD-10-CM | POA: Diagnosis not present

## 2021-04-14 DIAGNOSIS — I10 Essential (primary) hypertension: Secondary | ICD-10-CM

## 2021-04-14 DIAGNOSIS — Z124 Encounter for screening for malignant neoplasm of cervix: Secondary | ICD-10-CM

## 2021-04-14 DIAGNOSIS — Z8049 Family history of malignant neoplasm of other genital organs: Secondary | ICD-10-CM

## 2021-04-14 LAB — CBC
HCT: 38.5 % (ref 36.0–46.0)
Hemoglobin: 12.8 g/dL (ref 12.0–15.0)
MCHC: 33.2 g/dL (ref 30.0–36.0)
MCV: 81.9 fl (ref 78.0–100.0)
Platelets: 212 10*3/uL (ref 150.0–400.0)
RBC: 4.71 Mil/uL (ref 3.87–5.11)
RDW: 17.1 % — ABNORMAL HIGH (ref 11.5–15.5)
WBC: 5.8 10*3/uL (ref 4.0–10.5)

## 2021-04-14 LAB — TSH: TSH: 3.33 u[IU]/mL (ref 0.35–4.50)

## 2021-04-14 LAB — LIPID PANEL
Cholesterol: 229 mg/dL — ABNORMAL HIGH (ref 0–200)
HDL: 44.2 mg/dL (ref >39.00)
LDL Cholesterol: 156 mg/dL — ABNORMAL HIGH (ref 0–99)
NonHDL: 185.27
Total CHOL/HDL Ratio: 5
Triglycerides: 144 mg/dL (ref 0.0–149.0)
VLDL: 28.8 mg/dL (ref 0.0–40.0)

## 2021-04-14 LAB — COMPREHENSIVE METABOLIC PANEL
ALT: 23 U/L (ref 0–35)
AST: 20 U/L (ref 0–37)
Albumin: 4.5 g/dL (ref 3.5–5.2)
Alkaline Phosphatase: 72 U/L (ref 39–117)
BUN: 14 mg/dL (ref 6–23)
CO2: 29 mEq/L (ref 19–32)
Calcium: 9.6 mg/dL (ref 8.4–10.5)
Chloride: 101 mEq/L (ref 96–112)
Creatinine, Ser: 0.86 mg/dL (ref 0.40–1.20)
GFR: 77.47 mL/min (ref >60.00)
Glucose, Bld: 86 mg/dL (ref 70–99)
Potassium: 3.4 mEq/L — ABNORMAL LOW (ref 3.5–5.1)
Sodium: 140 mEq/L (ref 135–145)
Total Bilirubin: 0.5 mg/dL (ref 0.2–1.2)
Total Protein: 7 g/dL (ref 6.0–8.3)

## 2021-04-14 LAB — HEMOGLOBIN A1C: Hgb A1c MFr Bld: 6.2 % (ref 4.6–6.5)

## 2021-04-14 NOTE — Progress Notes (Signed)
Per orders of [facility] pt is here for labs pt tolerated draw well.

## 2021-04-14 NOTE — Addendum Note (Signed)
Addended by: Beryle Lathe S on: 04/14/2021 09:19 AM   Modules accepted: Orders

## 2021-04-17 LAB — CYTOLOGY - PAP
Comment: NEGATIVE
Diagnosis: NEGATIVE
High risk HPV: NEGATIVE

## 2021-04-18 ENCOUNTER — Other Ambulatory Visit: Payer: Self-pay | Admitting: Nurse Practitioner

## 2021-04-18 DIAGNOSIS — L304 Erythema intertrigo: Secondary | ICD-10-CM

## 2021-04-18 MED ORDER — HYDROCHLOROTHIAZIDE 12.5 MG PO TABS: 12.5000 mg | ORAL_TABLET | Freq: Every day | ORAL | 1 refills | Status: DC

## 2021-04-18 MED ORDER — POTASSIUM CHLORIDE CRYS ER 20 MEQ PO TBCR: 20.0000 meq | EXTENDED_RELEASE_TABLET | Freq: Every day | ORAL | 1 refills | Status: DC

## 2021-04-18 MED ORDER — ROSUVASTATIN CALCIUM 20 MG PO TABS: 20.0000 mg | ORAL_TABLET | Freq: Every day | ORAL | 3 refills | Status: DC

## 2021-04-18 NOTE — Addendum Note (Signed)
Addended by: Leana Gamer on: 04/18/2021 04:22 PM   Modules accepted: Orders

## 2021-04-25 NOTE — Telephone Encounter (Signed)
Duplicate

## 2021-07-06 ENCOUNTER — Other Ambulatory Visit: Payer: Self-pay | Admitting: Nurse Practitioner

## 2021-07-06 DIAGNOSIS — L304 Erythema intertrigo: Secondary | ICD-10-CM

## 2021-07-07 NOTE — Telephone Encounter (Signed)
Chart supports rx refill Last ov: 04/13/2021 Last refill:04/15/2021

## 2021-10-12 ENCOUNTER — Other Ambulatory Visit: Payer: Self-pay | Admitting: Nurse Practitioner

## 2021-10-12 DIAGNOSIS — N951 Menopausal and female climacteric states: Secondary | ICD-10-CM

## 2021-10-12 DIAGNOSIS — I10 Essential (primary) hypertension: Secondary | ICD-10-CM

## 2021-10-12 NOTE — Telephone Encounter (Signed)
Chart supports Rx Last seen 03/2021 LVM for patient to scheduled f/u appointment.

## 2022-01-01 ENCOUNTER — Other Ambulatory Visit: Payer: Self-pay

## 2022-01-01 ENCOUNTER — Ambulatory Visit (INDEPENDENT_AMBULATORY_CARE_PROVIDER_SITE_OTHER): Payer: BC Managed Care – PPO | Admitting: Nurse Practitioner

## 2022-01-01 ENCOUNTER — Encounter: Payer: Self-pay | Admitting: Nurse Practitioner

## 2022-01-01 VITALS — BP 130/90 | HR 91 | Temp 97.2°F | Wt 212.2 lb

## 2022-01-01 DIAGNOSIS — J0100 Acute maxillary sinusitis, unspecified: Secondary | ICD-10-CM | POA: Diagnosis not present

## 2022-01-01 MED ORDER — AMOXICILLIN-POT CLAVULANATE 875-125 MG PO TABS: 1.0000 | ORAL_TABLET | Freq: Two times a day (BID) | ORAL | 0 refills | Status: DC

## 2022-01-01 MED ORDER — PREDNISONE 10 MG PO TABS: ORAL_TABLET | ORAL | 0 refills | Status: DC

## 2022-01-01 MED ORDER — GUAIFENESIN ER 600 MG PO TB12: 600.0000 mg | ORAL_TABLET | Freq: Two times a day (BID) | ORAL | 0 refills | Status: DC

## 2022-01-01 NOTE — Patient Instructions (Signed)
It was great to see you! ? ?Start augmentin (antibiotic) twice a day for 10 days. Start prednisone taper 6 tablets today, 5 tablets tomorrow, then decrease by 1 tablet every day until gone. Start mucinex 1 tablet twice a day to help with cough and chest congestion. ? ?Let's follow-up if your symptoms don't improve or worsen.  ? ?Take care, ? ?Vance Peper, NP ? ?

## 2022-01-01 NOTE — Progress Notes (Signed)
Acute Office Visit  Subjective:    Patient ID: Madeline Hawkins, female    DOB: Mar 19, 1966, 56 y.o.   MRN: [id number]  Chief Complaint  Patient presents with   Sore Throat    Pt c/o cough and sore throat x1 wk. Neg at-home covid test 3/4.     HPI Patient is in today for sore throat and cough for the last week. Home covid-19 test negative.  UPPER RESPIRATORY TRACT INFECTION  Fever: no Cough: yes Shortness of breath: no Wheezing: yes Chest pain: no Chest tightness: no Chest congestion: yes Nasal congestion: yes Runny nose: yes Post nasal drip: no Sneezing: yes Sore throat: yes Swollen glands: no Sinus pressure: yes Headache: no Face pain: no Toothache: no Ear pain: no bilateral Ear pressure: yes bilateral Eyes red/itching:no Eye drainage/crusting: no  Vomiting: no Rash: no Fatigue: yes Sick contacts: yes granddaughter, and works at a school  Strep contacts: no  Context: stable Recurrent sinusitis: no Relief with OTC cold/cough medications: no  Treatments attempted: afrin, coricidin, cough drops, tea with honey   Past Medical History:  Diagnosis Date   Anxiety    Bartholin gland cyst    CIN I (cervical intraepithelial neoplasia I) 1989   cryo...also in 2008 .. had cryo    GERD (gastroesophageal reflux disease)    Hyperlipidemia    Hypertension     Past Surgical History:  Procedure Laterality Date   ABDOMINAL HYSTERECTOMY  11/22/2011   Procedure: HYSTERECTOMY ABDOMINAL;  Surgeon: Terrance Mass, MD;  Location: [facility];  Service: Gynecology;  Laterality: N/A;   BARTHOLIN CYST MARSUPIALIZATION Left 12/02/2018   Procedure: BARTHOLIN MARSUPIALIZATION OF LEFT GLAND;  Surgeon: Princess Bruins, MD;  Location: [facility];  Service: Gynecology;  Laterality: Left;   colon polyps removed     GYNECOLOGIC CRYOSURGERY  07/07/2007   LAPAROSCOPIC LYSIS OF ADHESIONS  06/28/2015   Procedure: LAPAROSCOPIC LYSIS OF ABDOMINAL PELVIC ADHESIONS;  Surgeon:  Terrance Mass, MD;  Location: [facility];  Service: Gynecology;;   LAPAROSCOPIC UNILATERAL SALPINGO OOPHERECTOMY Right 06/28/2015   Procedure: LAPAROSCOPIC RIGHT SALPINGO OOPHORECTOMY;  Surgeon: Terrance Mass, MD;  Location: [facility];  Service: Gynecology;  Laterality: Right;   LAPAROSCOPIC UNILATERAL SALPINGO OOPHERECTOMY Left 10/11/2016   Procedure: LAPAROSCOPIC UNILATERAL OOPHORECTOMY with pelvic washings;  Surgeon: Terrance Mass, MD;  Location: [facility];  Service: Gynecology;  Laterality: Left;  HARMONIC SCAPEL   LAPAROSCOPY  11/22/2011   Procedure: LAPAROSCOPY DIAGNOSTIC;  Surgeon: Terrance Mass, MD;  Location: [facility];  Service: Gynecology;  Laterality: N/A;   OVARIAN CYST REMOVAL  11/22/2011   Procedure: OVARIAN CYSTECTOMY;  Surgeon: Terrance Mass, MD;  Location: [facility];  Service: Gynecology;  Laterality: Left;   SALPINGECTOMY     left for ectopic pregnancy   TUBAL LIGATION     right tubal ligation    Family History  Problem Relation Age of Onset   Diabetes Brother    Ovarian cancer Mother    Ovarian cancer Paternal Aunt    Heart disease Maternal Grandmother    Breast cancer Maternal Grandmother        Age 22's   Breast cancer Maternal Aunt        Age 19's    Social History   Socioeconomic History   Marital status: Married    Spouse name: Not on file   Number of children: Not on file   Years of education: Not on file   Highest education level: Not  on file  Occupational History   Not on file  Tobacco Use   Smoking status: Never   Smokeless tobacco: Never  Vaping Use   Vaping Use: Never used  Substance and Sexual Activity   Alcohol use: No    Alcohol/week: 0.0 standard drinks   Drug use: No   Sexual activity: Yes    Partners: Male    Birth control/protection: Surgical    Comment: 1st intercourse- 19, partners- 20, married- 59 yrs   Other Topics Concern   Not on file  Social History Narrative   Not on file   Social Determinants of Health   Financial  Resource Strain: Not on file  Food Insecurity: Not on file  Transportation Needs: Not on file  Physical Activity: Not on file  Stress: Not on file  Social Connections: Not on file  Intimate Partner Violence: Not on file    Outpatient Medications Prior to Visit  Medication Sig Dispense Refill   azelastine (OPTIVAR) 0.05 % ophthalmic solution INSTILL 1 DROP INTO BOTH EYES TWICE A DAY 18 mL 0   estradiol (CLIMARA - DOSED IN MG/24 HR) 0.1 mg/24hr patch PLACE 1 PATCH (0.1 MG TOTAL) ONTO THE SKIN ONCE A WEEK. 4 patch 3   hydrochlorothiazide (HYDRODIURIL) 12.5 MG tablet Take 1 tablet (12.5 mg total) by mouth daily. 90 tablet 1   KLOR-CON M20 20 MEQ tablet TAKE 1 TABLET BY MOUTH EVERY DAY 90 tablet 1   Multiple Vitamins-Minerals (WOMENS MULTIVITAMIN PLUS) TABS Take by mouth daily.     nystatin-triamcinolone ointment (MYCOLOG) APPLY TO AFFECTED AREA TWICE A DAY 30 g 0   rosuvastatin (CRESTOR) 20 MG tablet Take 1 tablet (20 mg total) by mouth daily. 90 tablet 3   meclizine (ANTIVERT) 25 MG tablet Take 1 tablet (25 mg total) by mouth 3 (three) times daily as needed for dizziness. (Patient not taking: Reported on 01/01/2022) 30 tablet 0   pantoprazole (PROTONIX) 40 MG tablet Take 1 tablet (40 mg total) by mouth daily. (Patient not taking: Reported on 01/01/2022) 90 tablet 2   No facility-administered medications prior to visit.    No Known Allergies  Review of Systems See pertinent positives and negatives per HPI.    Objective:    Physical Exam Vitals and nursing note reviewed.  Constitutional:      General: She is not in acute distress.    Appearance: Normal appearance.  HENT:     Head: Normocephalic.     Comments: Tenderness with palpation to maxillary sinuses    Right Ear: Tympanic membrane, ear canal and external ear normal.     Left Ear: Tympanic membrane, ear canal and external ear normal.  Eyes:     Conjunctiva/sclera: Conjunctivae normal.  Cardiovascular:     Rate and Rhythm:  Normal rate and regular rhythm.     Pulses: Normal pulses.     Heart sounds: Normal heart sounds.  Pulmonary:     Effort: Pulmonary effort is normal.     Breath sounds: Normal breath sounds.  Musculoskeletal:     Cervical back: Normal range of motion. No tenderness.  Lymphadenopathy:     Cervical: No cervical adenopathy.  Skin:    General: Skin is warm.  Neurological:     General: No focal deficit present.     Mental Status: She is alert and oriented to person, place, and time.  Psychiatric:        Mood and Affect: Mood normal.        Behavior:  Behavior normal.        Thought Content: Thought content normal.        Judgment: Judgment normal.    BP 130/90 (BP Location: Right Arm, Cuff Size: Large)    Pulse 91    Temp (!) 97.2 F (36.2 C) (Temporal)    Wt 212 lb 3.2 oz (96.3 kg)    LMP 11/10/2011    SpO2 98%    BMI 37.59 kg/m  Wt Readings from Last 3 Encounters:  01/01/22 212 lb 3.2 oz (96.3 kg)  04/13/21 217 lb 9.6 oz (98.7 kg)  03/23/20 222 lb (100.7 kg)    Health Maintenance Due  Topic Date Due   COVID-19 Vaccine (1) Never done   Zoster Vaccines- Shingrix (1 of 2) Never done   MAMMOGRAM  03/23/2021   INFLUENZA VACCINE  05/29/2021    There are no preventive care reminders to display for this patient.   Lab Results  Component Value Date   TSH 3.33 04/14/2021   Lab Results  Component Value Date   WBC 5.8 04/14/2021   HGB 12.8 04/14/2021   HCT 38.5 04/14/2021   MCV 81.9 04/14/2021   PLT 212.0 04/14/2021   Lab Results  Component Value Date   NA 140 04/14/2021   K 3.4 (L) 04/14/2021   CO2 29 04/14/2021   GLUCOSE 86 04/14/2021   BUN 14 04/14/2021   CREATININE 0.86 04/14/2021   BILITOT 0.5 04/14/2021   ALKPHOS 72 04/14/2021   AST 20 04/14/2021   ALT 23 04/14/2021   PROT 7.0 04/14/2021   ALBUMIN 4.5 04/14/2021   CALCIUM 9.6 04/14/2021   ANIONGAP 9 12/02/2018   GFR 77.47 04/14/2021   Lab Results  Component Value Date   CHOL 229 (H) 04/14/2021   Lab  Results  Component Value Date   HDL 44.20 04/14/2021   Lab Results  Component Value Date   LDLCALC 156 (H) 04/14/2021   Lab Results  Component Value Date   TRIG 144.0 04/14/2021   Lab Results  Component Value Date   CHOLHDL 5 04/14/2021   Lab Results  Component Value Date   HGBA1C 6.2 04/14/2021       Assessment & Plan:   Problem List Items Addressed This Visit   None Visit Diagnoses     Acute non-recurrent maxillary sinusitis    -  Primary   Treat with Augmentin BID x10 days and prednisone taper. Mucinex BID for chest congestion and cough.  Encourage fluids and rest. F/U if not improving   Relevant Medications   amoxicillin-clavulanate (AUGMENTIN) 875-125 MG tablet   predniSONE (DELTASONE) 10 MG tablet   guaiFENesin (MUCINEX) 600 MG 12 hr tablet        Meds ordered this encounter  Medications   amoxicillin-clavulanate (AUGMENTIN) 875-125 MG tablet    Sig: Take 1 tablet by mouth 2 (two) times daily.    Dispense:  20 tablet    Refill:  0   predniSONE (DELTASONE) 10 MG tablet    Sig: Take 6 tablets today, then 5 tablets tomorrow, then decrease by 1 tablet every day until gone    Dispense:  21 tablet    Refill:  0   guaiFENesin (MUCINEX) 600 MG 12 hr tablet    Sig: Take 1 tablet (600 mg total) by mouth 2 (two) times daily.    Dispense:  20 tablet    Refill:  0     Charyl Dancer, NP

## 2022-01-12 ENCOUNTER — Other Ambulatory Visit: Payer: Self-pay | Admitting: Nurse Practitioner

## 2022-01-12 DIAGNOSIS — I10 Essential (primary) hypertension: Secondary | ICD-10-CM

## 2022-02-07 ENCOUNTER — Encounter: Payer: Self-pay | Admitting: Nurse Practitioner

## 2022-02-07 ENCOUNTER — Ambulatory Visit: Payer: BC Managed Care – PPO | Admitting: Nurse Practitioner

## 2022-02-07 VITALS — BP 116/78 | HR 74 | Temp 97.0°F | Ht 63.0 in | Wt 209.2 lb

## 2022-02-07 DIAGNOSIS — N951 Menopausal and female climacteric states: Secondary | ICD-10-CM

## 2022-02-07 DIAGNOSIS — I10 Essential (primary) hypertension: Secondary | ICD-10-CM | POA: Diagnosis not present

## 2022-02-07 DIAGNOSIS — R739 Hyperglycemia, unspecified: Secondary | ICD-10-CM | POA: Diagnosis not present

## 2022-02-07 DIAGNOSIS — K219 Gastro-esophageal reflux disease without esophagitis: Secondary | ICD-10-CM | POA: Diagnosis not present

## 2022-02-07 DIAGNOSIS — Z800 Family history of malignant neoplasm of digestive organs: Secondary | ICD-10-CM | POA: Insufficient documentation

## 2022-02-07 DIAGNOSIS — E785 Hyperlipidemia, unspecified: Secondary | ICD-10-CM | POA: Diagnosis not present

## 2022-02-07 LAB — BASIC METABOLIC PANEL
BUN: 11 mg/dL (ref 6–23)
CO2: 29 mEq/L (ref 19–32)
Calcium: 9.1 mg/dL (ref 8.4–10.5)
Chloride: 105 mEq/L (ref 96–112)
Creatinine, Ser: 0.72 mg/dL (ref 0.40–1.20)
GFR: 95.33 mL/min (ref >60.00)
Glucose, Bld: 76 mg/dL (ref 70–99)
Potassium: 4.3 mEq/L (ref 3.5–5.1)
Sodium: 140 mEq/L (ref 135–145)

## 2022-02-07 LAB — LIPID PANEL
Cholesterol: 155 mg/dL (ref 0–200)
HDL: 42.6 mg/dL (ref >39.00)
LDL Cholesterol: 100 mg/dL — ABNORMAL HIGH (ref 0–99)
NonHDL: 111.99
Total CHOL/HDL Ratio: 4
Triglycerides: 62 mg/dL (ref 0.0–149.0)
VLDL: 12.4 mg/dL (ref 0.0–40.0)

## 2022-02-07 LAB — HEMOGLOBIN A1C: Hgb A1c MFr Bld: 5.9 % (ref 4.6–6.5)

## 2022-02-07 MED ORDER — HYDROCHLOROTHIAZIDE 12.5 MG PO TABS: 12.5000 mg | ORAL_TABLET | Freq: Every day | ORAL | 1 refills | Status: DC

## 2022-02-07 MED ORDER — POTASSIUM CHLORIDE CRYS ER 20 MEQ PO TBCR: 20.0000 meq | EXTENDED_RELEASE_TABLET | Freq: Every day | ORAL | 1 refills | Status: DC

## 2022-02-07 MED ORDER — ESTRADIOL 0.075 MG/24HR TD PTWK: 0.0750 mg | MEDICATED_PATCH | TRANSDERMAL | 2 refills | Status: DC

## 2022-02-07 MED ORDER — PANTOPRAZOLE SODIUM 20 MG PO TBEC: 20.0000 mg | DELAYED_RELEASE_TABLET | Freq: Every day | ORAL | 1 refills | Status: DC

## 2022-02-07 NOTE — Progress Notes (Signed)
? ?             Established Patient Visit ? ?Patient: Madeline Hawkins   DOB: 1966-06-02   56 y.o. Female  MRN: [id number] ?Visit Date: 02/07/2022 ? ?Subjective:  ?  ?Chief Complaint  ?Patient presents with  ? Follow-up  ?  6 month f/u on HTN and cholesterol.  ?Pt is fasting.   ? ?HPI ?HTN (hypertension) ?BP at goal with HCTZ ?Denies any adverse side effects ?BP Readings from Last 3 Encounters:  ?02/07/22 116/78  ?01/01/22 130/90  ?04/13/21 122/78  ? ?Repeat BMP ?Maintain med dose ? ?Gastroesophageal reflux disease ?Reports heartburn with use of PPI ?No blood in stool, no weight loss ?protonix refill sent ? ?Hyperlipidemia ?No adverse effects with crestor ?Repeat lipid panel ? ?Vasomotor symptoms due to menopause ?Use of estradiol patch since 2017. ?S/p hysterectomy with oophorectomy 2017. ?We discussed possible adverse effects of systemic estrogen >63yr. She agreed to wean off estrogen. ? ?Sent estradiol 0.'075mg'$  weekly x 361month then 0.'05mg'$  weekly x 4weeks, then 0.'025mg'$  weekly x 4weeks, then stop ? ?Hyperglycemia ?Repeat hgbA1c ?She has made dietary modifications (low carb and low sugar). She also start daily exercise (walking). ? ?Reviewed medical, surgical, and social history today ? ?Medications: ?Outpatient Medications Prior to Visit  ?Medication Sig  ? azelastine (OPTIVAR) 0.05 % ophthalmic solution INSTILL 1 DROP INTO BOTH EYES TWICE A DAY  ? hydrochlorothiazide (HYDRODIURIL) 12.5 MG tablet TAKE 1 TABLET BY MOUTH EVERY DAY  ? KLOR-CON M20 20 MEQ tablet TAKE 1 TABLET BY MOUTH EVERY DAY  ? meclizine (ANTIVERT) 25 MG tablet Take 1 tablet (25 mg total) by mouth 3 (three) times daily as needed for dizziness.  ? Multiple Vitamins-Minerals (WOMENS MULTIVITAMIN PLUS) TABS Take by mouth daily.  ? nystatin-triamcinolone ointment (MYCOLOG) APPLY TO AFFECTED AREA TWICE A DAY  ? rosuvastatin (CRESTOR) 20 MG tablet Take 1 tablet (20 mg total) by mouth daily.  ? [DISCONTINUED] estradiol (CLIMARA - DOSED IN MG/24 HR) 0.1  mg/24hr patch PLACE 1 PATCH (0.1 MG TOTAL) ONTO THE SKIN ONCE A WEEK.  ? [DISCONTINUED] pantoprazole (PROTONIX) 40 MG tablet Take 1 tablet (40 mg total) by mouth daily.  ? [DISCONTINUED] amoxicillin-clavulanate (AUGMENTIN) 875-125 MG tablet Take 1 tablet by mouth 2 (two) times daily. (Patient not taking: Reported on 02/07/2022)  ? [DISCONTINUED] guaiFENesin (MUCINEX) 600 MG 12 hr tablet Take 1 tablet (600 mg total) by mouth 2 (two) times daily. (Patient not taking: Reported on 02/07/2022)  ? [DISCONTINUED] predniSONE (DELTASONE) 10 MG tablet Take 6 tablets today, then 5 tablets tomorrow, then decrease by 1 tablet every day until gone (Patient not taking: Reported on 02/07/2022)  ? ?No facility-administered medications prior to visit.  ? ?Reviewed past medical and social history.  ? ?ROS per HPI above ? ? ?   ?Objective:  ?BP 116/78 (BP Location: Left Arm, Patient Position: Sitting, Cuff Size: Normal)   Pulse 74   Temp (!) 97 ?F (36.1 ?C) (Temporal)   Ht '5\' 3"'$  (1.6 m)   Wt 209 lb 3.2 oz (94.9 kg)   LMP 11/10/2011   SpO2 97%   BMI 37.06 kg/m?  ? ?  ? ?Physical Exam ?Vitals reviewed.  ?Cardiovascular:  ?   Rate and Rhythm: Normal rate.  ?   Pulses: Normal pulses.  ?Pulmonary:  ?   Effort: Pulmonary effort is normal.  ?Musculoskeletal:  ?   Right lower leg: No edema.  ?   Left lower leg: No edema.  ?Neurological:  ?  Mental Status: She is alert and oriented to person, place, and time.  ?  ?No results found for any visits on 02/07/22. ?   ?Assessment & Plan:  ?  ?Problem List Items Addressed This Visit   ? ?  ? Cardiovascular and Mediastinum  ? HTN (hypertension) - Primary  ?  BP at goal with HCTZ ?Denies any adverse side effects ?BP Readings from Last 3 Encounters:  ?02/07/22 116/78  ?01/01/22 130/90  ?04/13/21 122/78  ? ?Repeat BMP ?Maintain med dose ?  ?  ? Relevant Orders  ? Basic metabolic panel  ?  ? Digestive  ? Gastroesophageal reflux disease  ?  Reports heartburn with use of PPI ?No blood in stool, no  weight loss ?protonix refill sent ?  ?  ? Relevant Medications  ? pantoprazole (PROTONIX) 20 MG tablet  ?  ? Other  ? Hyperglycemia  ?  Repeat hgbA1c ?She has made dietary modifications (low carb and low sugar). She also start daily exercise (walking). ?  ?  ? Relevant Orders  ? Hemoglobin A1c  ? Hyperlipidemia  ?  No adverse effects with crestor ?Repeat lipid panel ?  ?  ? Relevant Orders  ? Lipid panel  ? Vasomotor symptoms due to menopause  ?  Use of estradiol patch since 2017. ?S/p hysterectomy with oophorectomy 2017. ?We discussed possible adverse effects of systemic estrogen >36yr. She agreed to wean off estrogen. ? ?Sent estradiol 0.'075mg'$  weekly x 354month then 0.'05mg'$  weekly x 4weeks, then 0.'025mg'$  weekly x 4weeks, then stop ?  ?  ? Relevant Medications  ? estradiol (CLIMARA - DOSED IN MG/24 HR) 0.075 mg/24hr patch  ? ?Return in about 6 months (around 08/09/2022) for CPE (fasting). ? ?  ? ?ChWilfred LacyNP ? ? ?

## 2022-02-07 NOTE — Assessment & Plan Note (Signed)
Use of estradiol patch since 2017. ?S/p hysterectomy with oophorectomy 2017. ?We discussed possible adverse effects of systemic estrogen >15yr. She agreed to wean off estrogen. ? ?Sent estradiol 0.'075mg'$  weekly x 353month then 0.'05mg'$  weekly x 4weeks, then 0.'025mg'$  weekly x 4weeks, then stop ?

## 2022-02-07 NOTE — Assessment & Plan Note (Signed)
BP at goal with HCTZ ?Denies any adverse side effects ?BP Readings from Last 3 Encounters:  ?02/07/22 116/78  ?01/01/22 130/90  ?04/13/21 122/78  ? ?Repeat BMP ?Maintain med dose ?

## 2022-02-07 NOTE — Assessment & Plan Note (Signed)
No adverse effects with crestor ?Repeat lipid panel ?

## 2022-02-07 NOTE — Patient Instructions (Addendum)
Go to lab for blood draw ?Maintain current medications ?

## 2022-02-07 NOTE — Assessment & Plan Note (Signed)
Repeat hgbA1c ?She has made dietary modifications (low carb and low sugar). She also start daily exercise (walking). ?

## 2022-02-07 NOTE — Assessment & Plan Note (Signed)
Reports heartburn with use of PPI ?No blood in stool, no weight loss ?protonix refill sent ?

## 2022-04-12 ENCOUNTER — Other Ambulatory Visit: Payer: Self-pay | Admitting: Nurse Practitioner

## 2022-04-12 DIAGNOSIS — E785 Hyperlipidemia, unspecified: Secondary | ICD-10-CM

## 2022-04-12 NOTE — Telephone Encounter (Signed)
Chart Supports Rx Last OV: 01/2022 Next OV: 07/2022 

## 2022-05-06 ENCOUNTER — Other Ambulatory Visit: Payer: Self-pay | Admitting: Nurse Practitioner

## 2022-05-06 DIAGNOSIS — N951 Menopausal and female climacteric states: Secondary | ICD-10-CM

## 2022-05-06 DIAGNOSIS — L304 Erythema intertrigo: Secondary | ICD-10-CM

## 2022-05-07 NOTE — Telephone Encounter (Signed)
Chart supports Rx Last OV: 01/2022 Next OV: 07/2022

## 2022-06-03 ENCOUNTER — Other Ambulatory Visit: Payer: Self-pay | Admitting: Nurse Practitioner

## 2022-06-03 DIAGNOSIS — K219 Gastro-esophageal reflux disease without esophagitis: Secondary | ICD-10-CM

## 2022-06-04 NOTE — Telephone Encounter (Signed)
Chart supports Rx Last OV: 01/2022 Next OV: 07/2022

## 2022-08-02 ENCOUNTER — Other Ambulatory Visit: Payer: Self-pay | Admitting: Nurse Practitioner

## 2022-08-02 DIAGNOSIS — N951 Menopausal and female climacteric states: Secondary | ICD-10-CM

## 2022-08-02 NOTE — Telephone Encounter (Signed)
Chart supports Rx Last OV: 01/2022 Next OV: 07/2022

## 2022-08-06 ENCOUNTER — Emergency Department (HOSPITAL_BASED_OUTPATIENT_CLINIC_OR_DEPARTMENT_OTHER)
Admission: EM | Admit: 2022-08-06 | Discharge: 2022-08-06 | Disposition: A | Discharge status: Home / Self Care | Payer: BC Managed Care – PPO | Attending: Emergency Medicine | Admitting: Emergency Medicine

## 2022-08-06 ENCOUNTER — Emergency Department (HOSPITAL_BASED_OUTPATIENT_CLINIC_OR_DEPARTMENT_OTHER): Payer: BC Managed Care – PPO

## 2022-08-06 ENCOUNTER — Other Ambulatory Visit: Payer: Self-pay

## 2022-08-06 ENCOUNTER — Encounter (HOSPITAL_BASED_OUTPATIENT_CLINIC_OR_DEPARTMENT_OTHER): Payer: Self-pay | Admitting: Emergency Medicine

## 2022-08-06 DIAGNOSIS — Z79899 Other long term (current) drug therapy: Secondary | ICD-10-CM | POA: Insufficient documentation

## 2022-08-06 DIAGNOSIS — I10 Essential (primary) hypertension: Secondary | ICD-10-CM | POA: Diagnosis not present

## 2022-08-06 DIAGNOSIS — R519 Headache, unspecified: Secondary | ICD-10-CM | POA: Diagnosis present

## 2022-08-06 LAB — CBC
HCT: 38.4 % (ref 36.0–46.0)
Hemoglobin: 12.3 g/dL (ref 12.0–15.0)
MCH: 27.7 pg (ref 26.0–34.0)
MCHC: 32 g/dL (ref 30.0–36.0)
MCV: 86.5 fL (ref 80.0–100.0)
Platelets: 210 10*3/uL (ref 150–400)
RBC: 4.44 MIL/uL (ref 3.87–5.11)
RDW: 15.5 % (ref 11.5–15.5)
WBC: 5.3 10*3/uL (ref 4.0–10.5)
nRBC: 0 % (ref 0.0–0.2)

## 2022-08-06 LAB — BASIC METABOLIC PANEL
Anion gap: 9 (ref 5–15)
BUN: 12 mg/dL (ref 6–20)
CO2: 25 mmol/L (ref 22–32)
Calcium: 9.6 mg/dL (ref 8.9–10.3)
Chloride: 105 mmol/L (ref 98–111)
Creatinine, Ser: 0.72 mg/dL (ref 0.44–1.00)
GFR, Estimated: >60 mL/min (ref >60)
Glucose, Bld: 89 mg/dL (ref 70–99)
Potassium: 3.6 mmol/L (ref 3.5–5.1)
Sodium: 139 mmol/L (ref 135–145)

## 2022-08-06 LAB — TROPONIN I (HIGH SENSITIVITY)
Troponin I (High Sensitivity): 2 ng/L (ref <18)
Troponin I (High Sensitivity): 3 ng/L (ref <18)

## 2022-08-06 NOTE — ED Triage Notes (Signed)
HTN at home since this am.  BP at home was 152/91, 161/105.  Some blurry vision and headache.  No chest pain or sob.

## 2022-08-06 NOTE — ED Provider Notes (Signed)
[facility] Provider Note   CSN: [id number] Arrival date & time: 08/06/22  1441     History  Chief Complaint  Patient presents with   Hypertension    Madeline Hawkins is a 56 y.o. female history of hypertension, vertigo, hyperlipidemia here for evaluation of elevated blood pressure.  States she woke up this morning and checked her blood pressure which is elevated at 152/91.  States she has been compliant with her home blood pressure medication.  She took her blood pressure machine to work when she noted she had a headache, she rechecked her blood pressure which was 161/105.  She does note a floater to her right eye which has been there for some time.  No diplopia, visual field cuts, blurred vision.  No slurred speech, difficulty with word findings.  No neck pain, chest pain, shortness of breath.  No "sudden onset" headache.  No numbness or weakness.  HPI     Home Medications Prior to Admission medications   Medication Sig Start Date End Date Taking? Authorizing Provider  estradiol (CLIMARA - DOSED IN MG/24 HR) 0.075 mg/24hr patch APPLY 1 PATCH ONCE A WEEK 08/02/22   Nche, Charlene Brooke, NP  hydrochlorothiazide (HYDRODIURIL) 12.5 MG tablet Take 1 tablet (12.5 mg total) by mouth daily. 02/07/22   Nche, Charlene Brooke, NP  meclizine (ANTIVERT) 25 MG tablet Take 1 tablet (25 mg total) by mouth 3 (three) times daily as needed for dizziness. 02/10/19   Brunetta Jeans, PA-C  Multiple Vitamins-Minerals (WOMENS MULTIVITAMIN PLUS) TABS Take by mouth daily.    [provider]  pantoprazole (PROTONIX) 20 MG tablet TAKE 1 TABLET BY MOUTH EVERY DAY 06/04/22   Nche, Charlene Brooke, NP  potassium chloride SA (KLOR-CON M20) 20 MEQ tablet Take 1 tablet (20 mEq total) by mouth daily. 02/07/22   Nche, Charlene Brooke, NP  rosuvastatin (CRESTOR) 20 MG tablet TAKE 1 TABLET BY MOUTH EVERY DAY 04/12/22   Nche, Charlene Brooke, NP      Allergies    Patient has no known  allergies.    Review of Systems   Review of Systems  Constitutional: Negative.   HENT: Negative.    Eyes: Negative.   Respiratory: Negative.    Cardiovascular: Negative.   Gastrointestinal: Negative.   Genitourinary: Negative.   Musculoskeletal: Negative.   Skin: Negative.   Neurological:  Positive for headaches. Negative for dizziness, tremors, seizures, syncope, facial asymmetry, speech difficulty, weakness, light-headedness and numbness.  All other systems reviewed and are negative.   Physical Exam Updated Vital Signs BP (!) 135/95   Pulse 66   Temp 98 F (36.7 C) (Oral)   Resp 19   Ht '5\' 3"'$  (1.6 m)   Wt 93 kg   LMP 11/10/2011   SpO2 99%   BMI 36.31 kg/m  Physical Exam Physical Exam  Constitutional: Pt is oriented to person, place, and time. Pt appears well-developed and well-nourished. No distress.  HENT:  Head: Normocephalic and atraumatic.  Mouth/Throat: Oropharynx is clear and moist.  Eyes: Conjunctivae and EOM are normal. Pupils are equal, round, and reactive to light. No scleral icterus.  No horizontal, vertical or rotational nystagmus  Neck: Normal range of motion. Neck supple.  Full active and passive ROM without pain No midline or paraspinal tenderness No nuchal rigidity or meningeal signs  Cardiovascular: Normal rate, regular rhythm and intact distal pulses.   Pulmonary/Chest: Effort normal and breath sounds normal. No respiratory distress. Pt has no wheezes. No rales.  Abdominal: Soft. Bowel sounds are normal. There is no tenderness. There is no rebound and no guarding.  Musculoskeletal: Normal range of motion.  Lymphadenopathy:    No cervical adenopathy.  Neurological: Pt. is alert and oriented to person, place, and time. He has normal reflexes. No cranial nerve deficit.  Exhibits normal muscle tone. Coordination normal.  Mental Status:  Alert, oriented, thought content appropriate. Speech fluent without evidence of aphasia. Able to follow 2 step  commands without difficulty.  Cranial Nerves:  II:  Peripheral visual fields grossly normal, pupils equal, round, reactive to light III,IV, VI: ptosis not present, extra-ocular motions intact bilaterally  V,VII: smile symmetric, facial light touch sensation equal VIII: hearing grossly normal bilaterally  IX,X: midline uvula rise  XI: bilateral shoulder shrug equal and strong XII: midline tongue extension  Motor:  5/5 in upper and lower extremities bilaterally including strong and equal grip strength and dorsiflexion/plantar flexion Sensory: Pinprick and light touch normal in all extremities.  Deep Tendon Reflexes: 2+ and symmetric  Cerebellar: normal finger-to-nose with bilateral upper extremities Gait: normal gait and balance CV: distal pulses palpable throughout   Skin: Skin is warm and dry. No rash noted. Pt is not diaphoretic.  Psychiatric: Pt has a normal mood and affect. Behavior is normal. Judgment and thought content normal.  Nursing note and vitals reviewed.  ED Results / Procedures / Treatments   Labs (all labs ordered are listed, but only abnormal results are displayed) Labs Reviewed  BASIC METABOLIC PANEL  CBC  TROPONIN I (HIGH SENSITIVITY)  TROPONIN I (HIGH SENSITIVITY)    EKG EKG Interpretation  Date/Time:  Monday August 06 2022 14:52:54 EDT Ventricular Rate:  76 PR Interval:  164 QRS Duration: 80 QT Interval:  378 QTC Calculation: 425 R Axis:   71 Text Interpretation: Normal sinus rhythm Nonspecific T wave abnormality Abnormal ECG When compared with ECG of 31-Mar-2018 12:46, No significant change since last tracing Confirmed by Aletta Edouard [id number]) on 08/06/2022 2:55:48 PM  Radiology CT Head Wo Contrast  Result Date: 08/06/2022 CLINICAL DATA:  Headache, new or worsening (Age >= 50y) headache, elevated blood pressure EXAM: CT HEAD WITHOUT CONTRAST TECHNIQUE: Contiguous axial images were obtained from the base of the skull through the vertex without  intravenous contrast. RADIATION DOSE REDUCTION: This exam was performed according to the departmental dose-optimization program which includes automated exposure control, adjustment of the mA and/or kV according to patient size and/or use of iterative reconstruction technique. COMPARISON:  CT head 01/24/2011 FINDINGS: Brain: No evidence of large-territorial acute infarction. No parenchymal hemorrhage. No mass lesion. No extra-axial collection. Chronic bilateral basal ganglia mineralization. No mass effect or midline shift. No hydrocephalus. Basilar cisterns are patent. Vascular: No hyperdense vessel. Skull: No acute fracture or focal lesion. Sinuses/Orbits: Paranasal sinuses and mastoid air cells are clear. The orbits are unremarkable. Other: None. IMPRESSION: No acute intracranial abnormality. Electronically Signed   By: Iven Finn M.D.   On: 08/06/2022 18:15   DG Chest 2 View  Result Date: 08/06/2022 CLINICAL DATA:  Hypertension. EXAM: CHEST - 2 VIEW COMPARISON:  Chest radiograph dated October 09, 2021 FINDINGS: The heart size and mediastinal contours are within normal limits. Both lungs are clear. The visualized skeletal structures are unremarkable. IMPRESSION: No active cardiopulmonary disease. Electronically Signed   By: Keane Police D.O.   On: 08/06/2022 15:25    Procedures Procedures    Medications Ordered in ED Medications - No data to display  ED Course/ Medical Decision Making/ A&P  19 with history of hypertension, vertigo, hyperlipidemia, GERD here for evaluation of elevated blood pressure.  Took her blood pressure at home which is noted to be elevated.  Subsequently rechecked her blood pressure at work when she had a headache and noted continued elevated blood pressure.  She states she is compliant with her medications.  She has a nonfocal neuro exam without deficits.  Denies any "sudden onset" headache.  No chest pain, shortness of breath, lower extremity swelling.  Note she does  have a floater to her right eye which has been there for some time.  She denies any diplopia, visual field cuts, blurred vision. Currently asymptomatic.   Labs and imaging personally viewed and interpreted:  Trop 2, 3>> flat CBC without leukocytosis BMP without significant abnormality Xray chest without cardiomegaly, pulm edema, pneumothorax, PNA CT head without significant abnormality EKG without ischemic changes  Patient reassessed.  Currently asymptomatic.  Blood pressure trending down.  At this time I have low suspicion for hypertensive emergency, urgency, CVA, dissection, infectious process.  Urged close follow-up with PCP for reevaluation of her blood pressure.   The patient has been appropriately medically screened and/or stabilized in the ED. I have low suspicion for any other emergent medical condition which would require further screening, evaluation or treatment in the ED or require inpatient management.  Patient is hemodynamically stable and in no acute distress.  Patient able to ambulate in department prior to ED.  Evaluation does not show acute pathology that would require ongoing or additional emergent interventions while in the emergency department or further inpatient treatment.  I have discussed the diagnosis with the patient and answered all questions.  Pain is been managed while in the emergency department and patient has no further complaints prior to discharge.  Patient is comfortable with plan discussed in room and is stable for discharge at this time.  I have discussed strict return precautions for returning to the emergency department.  Patient was encouraged to follow-up with PCP/specialist refer to at discharge.                           Medical Decision Making Amount and/or Complexity of Data Reviewed Independent Historian: spouse External Data Reviewed: labs, radiology, ECG and notes. Labs: ordered. Decision-making details documented in ED Course. Radiology:  ordered and independent interpretation performed. Decision-making details documented in ED Course. ECG/medicine tests: ordered and independent interpretation performed. Decision-making details documented in ED Course.  Risk OTC drugs. Prescription drug management. Parenteral controlled substances. Decision regarding hospitalization. Diagnosis or treatment significantly limited by social determinants of health.         Final Clinical Impression(s) / ED Diagnoses Final diagnoses:  Hypertension, unspecified type    Rx / DC Orders ED Discharge Orders     None         Rigby Swamy A, PA-C 31/49/70 [id number]    Lianne Cure, DO 85/88/50 [id number]

## 2022-08-06 NOTE — Discharge Instructions (Signed)
Sure to follow-up with ophthalmology and your primary care provider  Return for new or worsening symptoms

## 2022-08-10 ENCOUNTER — Encounter: Payer: Self-pay | Admitting: Nurse Practitioner

## 2022-08-10 ENCOUNTER — Encounter: Payer: BC Managed Care – PPO | Admitting: Nurse Practitioner

## 2022-08-10 ENCOUNTER — Ambulatory Visit (INDEPENDENT_AMBULATORY_CARE_PROVIDER_SITE_OTHER): Payer: BC Managed Care – PPO | Admitting: Nurse Practitioner

## 2022-08-10 VITALS — BP 126/80 | HR 83 | Temp 97.5°F | Wt 208.2 lb

## 2022-08-10 DIAGNOSIS — E7800 Pure hypercholesterolemia, unspecified: Secondary | ICD-10-CM | POA: Diagnosis not present

## 2022-08-10 DIAGNOSIS — R739 Hyperglycemia, unspecified: Secondary | ICD-10-CM

## 2022-08-10 DIAGNOSIS — Z1231 Encounter for screening mammogram for malignant neoplasm of breast: Secondary | ICD-10-CM

## 2022-08-10 DIAGNOSIS — N951 Menopausal and female climacteric states: Secondary | ICD-10-CM | POA: Diagnosis not present

## 2022-08-10 DIAGNOSIS — F4321 Adjustment disorder with depressed mood: Secondary | ICD-10-CM

## 2022-08-10 DIAGNOSIS — I10 Essential (primary) hypertension: Secondary | ICD-10-CM | POA: Diagnosis not present

## 2022-08-10 DIAGNOSIS — Z0001 Encounter for general adult medical examination with abnormal findings: Secondary | ICD-10-CM | POA: Diagnosis not present

## 2022-08-10 LAB — LIPID PANEL
Cholesterol: 175 mg/dL (ref 0–200)
HDL: 54.9 mg/dL (ref >39.00)
LDL Cholesterol: 110 mg/dL — ABNORMAL HIGH (ref 0–99)
NonHDL: 120.45
Total CHOL/HDL Ratio: 3
Triglycerides: 52 mg/dL (ref 0.0–149.0)
VLDL: 10.4 mg/dL (ref 0.0–40.0)

## 2022-08-10 LAB — HEPATIC FUNCTION PANEL
ALT: 15 U/L (ref 0–35)
AST: 15 U/L (ref 0–37)
Albumin: 4.4 g/dL (ref 3.5–5.2)
Alkaline Phosphatase: 47 U/L (ref 39–117)
Bilirubin, Direct: 0.1 mg/dL (ref 0.0–0.3)
Total Bilirubin: 0.4 mg/dL (ref 0.2–1.2)
Total Protein: 7.3 g/dL (ref 6.0–8.3)

## 2022-08-10 LAB — HEMOGLOBIN A1C: Hgb A1c MFr Bld: 6 % (ref 4.6–6.5)

## 2022-08-10 MED ORDER — VENLAFAXINE HCL ER 37.5 MG PO CP24: 37.5000 mg | ORAL_CAPSULE | Freq: Every day | ORAL | 5 refills | Status: DC

## 2022-08-10 MED ORDER — ESTRADIOL 0.05 MG/24HR TD PTWK: 0.0500 mg | MEDICATED_PATCH | TRANSDERMAL | 0 refills | Status: DC

## 2022-08-10 NOTE — Patient Instructions (Signed)
Go to lab Start effexor Decrease estradiol patch to 0.05 weekly Maintain heart healthy diet Schedule appt for mammogram  Calorie Counting for Weight Loss Calories are units of energy. Your body needs a certain number of calories from food to keep going throughout the day. When you eat or drink more calories than your body needs, your body stores the extra calories mostly as fat. When you eat or drink fewer calories than your body needs, your body burns fat to get the energy it needs. Calorie counting means keeping track of how many calories you eat and drink each day. Calorie counting can be helpful if you need to lose weight. If you eat fewer calories than your body needs, you should lose weight. Ask your health care provider what a healthy weight is for you. For calorie counting to work, you will need to eat the right number of calories each day to lose a healthy amount of weight per week. A dietitian can help you figure out how many calories you need in a day and will suggest ways to reach your calorie goal. A healthy amount of weight to lose each week is usually 1-2 lb (0.5-0.9 kg). This usually means that your daily calorie intake should be reduced by 500-750 calories. Eating 1,200-1,500 calories a day can help most women lose weight. Eating 1,500-1,800 calories a day can help most men lose weight. What do I need to know about calorie counting? Work with your health care provider or dietitian to determine how many calories you should get each day. To meet your daily calorie goal, you will need to: Find out how many calories are in each food that you would like to eat. Try to do this before you eat. Decide how much of the food you plan to eat. Keep a food log. Do this by writing down what you ate and how many calories it had. To successfully lose weight, it is important to balance calorie counting with a healthy lifestyle that includes regular activity. Where do I find calorie  information?  The number of calories in a food can be found on a Nutrition Facts label. If a food does not have a Nutrition Facts label, try to look up the calories online or ask your dietitian for help. Remember that calories are listed per serving. If you choose to have more than one serving of a food, you will have to multiply the calories per serving by the number of servings you plan to eat. For example, the label on a package of bread might say that a serving size is 1 slice and that there are 90 calories in a serving. If you eat 1 slice, you will have eaten 90 calories. If you eat 2 slices, you will have eaten 180 calories. How do I keep a food log? After each time that you eat, record the following in your food log as soon as possible: What you ate. Be sure to include toppings, sauces, and other extras on the food. How much you ate. This can be measured in cups, ounces, or number of items. How many calories were in each food and drink. The total number of calories in the food you ate. Keep your food log near you, such as in a pocket-sized notebook or on an app or website on your mobile phone. Some programs will calculate calories for you and show you how many calories you have left to meet your daily goal. What are some portion-control tips? Know how  many calories are in a serving. This will help you know how many servings you can have of a certain food. Use a measuring cup to measure serving sizes. You could also try weighing out portions on a kitchen scale. With time, you will be able to estimate serving sizes for some foods. Take time to put servings of different foods on your favorite plates or in your favorite bowls and cups so you know what a serving looks like. Try not to eat straight from a food's packaging, such as from a bag or box. Eating straight from the package makes it hard to see how much you are eating and can lead to overeating. Put the amount you would like to eat in a cup  or on a plate to make sure you are eating the right portion. Use smaller plates, glasses, and bowls for smaller portions and to prevent overeating. Try not to multitask. For example, avoid watching TV or using your computer while eating. If it is time to eat, sit down at a table and enjoy your food. This will help you recognize when you are full. It will also help you be more mindful of what and how much you are eating. What are tips for following this plan? Reading food labels Check the calorie count compared with the serving size. The serving size may be smaller than what you are used to eating. Check the source of the calories. Try to choose foods that are high in protein, fiber, and vitamins, and low in saturated fat, trans fat, and sodium. Shopping Read nutrition labels while you shop. This will help you make healthy decisions about which foods to buy. Pay attention to nutrition labels for low-fat or fat-free foods. These foods sometimes have the same number of calories or more calories than the full-fat versions. They also often have added sugar, starch, or salt to make up for flavor that was removed with the fat. Make a grocery list of lower-calorie foods and stick to it. Cooking Try to cook your favorite foods in a healthier way. For example, try baking instead of frying. Use low-fat dairy products. Meal planning Use more fruits and vegetables. One-half of your plate should be fruits and vegetables. Include lean proteins, such as chicken, [place], and fish. Lifestyle Each week, aim to do one of the following: 150 minutes of moderate exercise, such as walking. 75 minutes of vigorous exercise, such as running. General information Know how many calories are in the foods you eat most often. This will help you calculate calorie counts faster. Find a way of tracking calories that works for you. Get creative. Try different apps or programs if writing down calories does not work for you. What  foods should I eat?  Eat nutritious foods. It is better to have a nutritious, high-calorie food, such as an avocado, than a food with few nutrients, such as a bag of potato chips. Use your calories on foods and drinks that will fill you up and will not leave you hungry soon after eating. Examples of foods that fill you up are nuts and nut butters, vegetables, lean proteins, and high-fiber foods such as whole grains. High-fiber foods are foods with more than 5 g of fiber per serving. Pay attention to calories in drinks. Low-calorie drinks include water and unsweetened drinks. The items listed above may not be a complete list of foods and beverages you can eat. Contact a dietitian for more information. What foods should I limit? Limit foods  or drinks that are not good sources of vitamins, minerals, or protein or that are high in unhealthy fats. These include: Candy. Other sweets. Sodas, specialty coffee drinks, alcohol, and juice. The items listed above may not be a complete list of foods and beverages you should avoid. Contact a dietitian for more information. How do I count calories when eating out? Pay attention to portions. Often, portions are much larger when eating out. Try these tips to keep portions smaller: Consider sharing a meal instead of getting your own. If you get your own meal, eat only half of it. Before you start eating, ask for a container and put half of your meal into it. When available, consider ordering smaller portions from the menu instead of full portions. Pay attention to your food and drink choices. Knowing the way food is cooked and what is included with the meal can help you eat fewer calories. If calories are listed on the menu, choose the lower-calorie options. Choose dishes that include vegetables, fruits, whole grains, low-fat dairy products, and lean proteins. Choose items that are boiled, broiled, grilled, or steamed. Avoid items that are buttered, battered,  fried, or served with cream sauce. Items labeled as crispy are usually fried, unless stated otherwise. Choose water, low-fat milk, unsweetened iced tea, or other drinks without added sugar. If you want an alcoholic beverage, choose a lower-calorie option, such as a glass of wine or light beer. Ask for dressings, sauces, and syrups on the side. These are usually high in calories, so you should limit the amount you eat. If you want a salad, choose a garden salad and ask for grilled meats. Avoid extra toppings such as bacon, cheese, or fried items. Ask for the dressing on the side, or ask for olive oil and vinegar or lemon to use as dressing. Estimate how many servings of a food you are given. Knowing serving sizes will help you be aware of how much food you are eating at restaurants. Where to find more information Centers for Disease Control and Prevention: http://www.wolf.info/ [place] Department of Agriculture: http://www.wilson-mendoza.org/ Summary Calorie counting means keeping track of how many calories you eat and drink each day. If you eat fewer calories than your body needs, you should lose weight. A healthy amount of weight to lose per week is usually 1-2 lb (0.5-0.9 kg). This usually means reducing your daily calorie intake by 500-750 calories. The number of calories in a food can be found on a Nutrition Facts label. If a food does not have a Nutrition Facts label, try to look up the calories online or ask your dietitian for help. Use smaller plates, glasses, and bowls for smaller portions and to prevent overeating. Use your calories on foods and drinks that will fill you up and not leave you hungry shortly after a meal. This information is not intended to replace advice given to you by your health care provider. Make sure you discuss any questions you have with your health care provider. Document Revised: 11/26/2019 Document Reviewed: 11/26/2019 Elsevier Patient Education  [place].

## 2022-08-10 NOTE — Progress Notes (Signed)
Complete physical exam  Patient: Madeline Hawkins   DOB: 12/17/1965   56 y.o. Female  MRN: [id number] Visit Date: 08/10/2022  Subjective:    Chief Complaint  Patient presents with   Annual Exam    CPE pt states she went to hospital and BP was high. Will schedule appt for mammo   Madeline Hawkins is a 56 y.o. female who presents today for a complete physical exam. She reports consuming a general diet.  Active at [facility]  She generally feels well. She reports sleeping fairly well. She does have additional problems to discuss today.  Vision:Yes Dental:Yes STD Screen:No  Most recent fall risk assessment:    08/10/2022   11:18 AM  [place] in the past year? 0  Number falls in past yr: 0  Injury with Fall? 0   Most recent depression screenings:    08/10/2022   11:45 AM 08/10/2022   11:18 AM  PHQ 2/9 Scores  PHQ - 2 Score 0 0  PHQ- 9 Score 0    HPI  HTN (hypertension) Normal BP today. Reports she is compliant with HCTZ dose and DASH diet. Home BP 150s/80s BP Readings from Last 3 Encounters:  08/10/22 126/80  08/06/22 (!) 131/90  02/07/22 116/78   Advised to bring BP machine to next appt Maintain current med dose  Vasomotor symptoms due to menopause Persistent hot flashes and mood swings with 0.'075mg'$  estradiol patch. Last PAP: 2019, 2022 (normal) She agreed to start effexor and continue to wean off estradiol. Sent 0.'05mg'$  weekly patch and effexor 37.'5mg'$  daily, schedule appt for mammogram F/up in 67month  Past Medical History:  Diagnosis Date   Anxiety    Bartholin gland cyst    CIN I (cervical intraepithelial neoplasia I) 1989   cryo...also in 2008 .. had cryo    GERD (gastroesophageal reflux disease)    Hyperlipidemia    Hypertension    Past Surgical History:  Procedure Laterality Date   ABDOMINAL HYSTERECTOMY  11/22/2011   Procedure: HYSTERECTOMY ABDOMINAL;  Surgeon: JTerrance Mass MD;  [place] [facility];  Service: Gynecology;  Laterality:  N/A;   BARTHOLIN CYST MARSUPIALIZATION Left 12/02/2018   Procedure: BARTHOLIN MARSUPIALIZATION OF LEFT GLAND;  Surgeon: LPrincess Bruins MD;  [place]  Service: Gynecology;  Laterality: Left;   colon polyps removed     GYNECOLOGIC CRYOSURGERY  07/07/2007   LAPAROSCOPIC LYSIS OF ADHESIONS  06/28/2015   Procedure: LAPAROSCOPIC LYSIS OF ABDOMINAL PELVIC ADHESIONS;  Surgeon: JTerrance Mass MD;  [place] [facility];  Service: Gynecology;;   LAPAROSCOPIC UNILATERAL SALPINGO OOPHERECTOMY Right 06/28/2015   Procedure: LAPAROSCOPIC RIGHT SALPINGO OOPHORECTOMY;  Surgeon: JTerrance Mass MD;  [place] [facility];  Service: Gynecology;  Laterality: Right;   LAPAROSCOPIC UNILATERAL SALPINGO OOPHERECTOMY Left 10/11/2016   Procedure: LAPAROSCOPIC UNILATERAL OOPHORECTOMY with pelvic washings;  Surgeon: JTerrance Mass MD;  [place] [facility];  Service: Gynecology;  Laterality: Left;  HARMONIC SCAPEL   LAPAROSCOPY  11/22/2011   Procedure: LAPAROSCOPY DIAGNOSTIC;  Surgeon: JTerrance Mass MD;  [place] [facility];  Service: Gynecology;  Laterality: N/A;   OVARIAN CYST REMOVAL  11/22/2011   Procedure: OVARIAN CYSTECTOMY;  Surgeon: JTerrance Mass MD;  [place] [facility];  Service: Gynecology;  Laterality: Left;   SALPINGECTOMY     left for ectopic pregnancy   TUBAL LIGATION     right tubal ligation   Social History   Socioeconomic History   Marital status: Married  Spouse name: Not on file   Number of children: Not on file   Years of education: Not on file   Highest education level: Not on file  Occupational History   Not on file  Tobacco Use   Smoking status: Never   Smokeless tobacco: Never  Vaping Use   Vaping Use: Never used  Substance and Sexual Activity   Alcohol use: No    Alcohol/week: 0.0 standard drinks of alcohol   Drug use: No   Sexual activity: Yes    Partners: Male    Birth control/protection: Surgical    Comment: 1st intercourse- 19, partners- 35,  married- 2 yrs   Other Topics Concern   Not on file  Social History Narrative   Not on file   Social Determinants of Health   Financial Resource Strain: Not on file  Food Insecurity: Not on file  Transportation Needs: Not on file  Physical Activity: Not on file  Stress: Not on file  Social Connections: Not on file  Intimate Partner Violence: Not on file   Family Status  Relation Name Status   Brother  (Not Specified)   Mother  (Not Specified)   Ethlyn Daniels  (Not Specified)   MGM  (Not Specified)   Mat Aunt  (Not Specified)   Family History  Problem Relation Age of Onset   Diabetes Brother    Ovarian cancer Mother    Ovarian cancer Paternal Aunt    Heart disease Maternal Grandmother    Breast cancer Maternal Grandmother        Age 59's   Breast cancer Maternal Aunt        Age 52's   No Known Allergies  Patient Care Team: Verlia Kaney, Charlene Brooke, NP as PCP - General (Internal Medicine) Terrance Mass, MD (Inactive) as Consulting Physician (Gynecology) Shirl Harris, [facility] [facility] as Consulting Physician (Optometry) Juanita Craver, MD as Consulting Physician (Gastroenterology)   Medications: Outpatient Medications Prior to Visit  Medication Sig   hydrochlorothiazide (HYDRODIURIL) 12.5 MG tablet Take 1 tablet (12.5 mg total) by mouth daily.   Multiple Vitamins-Minerals (WOMENS MULTIVITAMIN PLUS) TABS Take by mouth daily.   pantoprazole (PROTONIX) 20 MG tablet TAKE 1 TABLET BY MOUTH EVERY DAY   potassium chloride SA (KLOR-CON M20) 20 MEQ tablet Take 1 tablet (20 mEq total) by mouth daily.   rosuvastatin (CRESTOR) 20 MG tablet TAKE 1 TABLET BY MOUTH EVERY DAY   [DISCONTINUED] estradiol (CLIMARA - DOSED IN MG/24 HR) 0.075 mg/24hr patch APPLY 1 PATCH ONCE A WEEK   [DISCONTINUED] meclizine (ANTIVERT) 25 MG tablet Take 1 tablet (25 mg total) by mouth 3 (three) times daily as needed for dizziness. (Patient not taking: Reported on 08/10/2022)   No facility-administered medications prior to  visit.   Review of Systems  Constitutional:  Negative for fever.  HENT:  Negative for congestion and sore throat.   Eyes:        Negative for visual changes  Respiratory:  Negative for cough and shortness of breath.   Cardiovascular:  Negative for chest pain, palpitations and leg swelling.  Gastrointestinal:  Negative for blood in stool, constipation and diarrhea.  Genitourinary:  Negative for dysuria, frequency and urgency.  Musculoskeletal:  Negative for myalgias.  Skin:  Negative for rash.  Neurological:  Negative for dizziness and headaches.  Hematological:  Does not bruise/bleed easily.  Psychiatric/Behavioral:  Positive for dysphoric mood and sleep disturbance. Negative for self-injury and suicidal ideas. The patient is nervous/anxious.  Objective:  BP 126/80 (BP Location: Right Arm, Patient Position: Sitting, Cuff Size: Normal)   Pulse 83   Temp (!) 97.5 F (36.4 C) (Temporal)   Wt 208 lb 3.2 oz (94.4 kg)   LMP 11/10/2011   SpO2 94%   BMI 36.88 kg/m       Physical Exam Vitals reviewed.  Constitutional:      General: She is not in acute distress.    Appearance: She is well-developed.  HENT:     Right Ear: Tympanic membrane, ear canal and external ear normal.     Left Ear: Tympanic membrane, ear canal and external ear normal.     Nose: Nose normal.  Eyes:     Extraocular Movements: Extraocular movements intact.     Conjunctiva/sclera: Conjunctivae normal.  Cardiovascular:     Rate and Rhythm: Normal rate and regular rhythm.     Pulses: Normal pulses.     Heart sounds: Normal heart sounds.  Pulmonary:     Effort: Pulmonary effort is normal. No respiratory distress.     Breath sounds: Normal breath sounds.  Chest:     Chest wall: No tenderness.  Abdominal:     General: Bowel sounds are normal.     Palpations: Abdomen is soft.  Musculoskeletal:        General: Normal range of motion.     Cervical back: Normal range of motion and neck supple.      Right lower leg: No edema.     Left lower leg: No edema.  Skin:    General: Skin is warm and dry.  Neurological:     Mental Status: She is alert and oriented to person, place, and time.     Deep Tendon Reflexes: Reflexes are normal and symmetric.  Psychiatric:        Behavior: Behavior normal.        Thought Content: Thought content normal.     No results found for any visits on 08/10/22.    Assessment & Plan:    Routine Health Maintenance and Physical Exam  Immunization History  Administered Date(s) Administered   Influenza,inj,Quad PF,6+ Mos 11/14/2012, 07/22/2017, 07/29/2018, 08/26/2019   Tdap 03/15/2014   Health Maintenance  Topic Date Due   COVID-19 Vaccine (1) Never done   MAMMOGRAM  03/23/2021   INFLUENZA VACCINE  10/04/2022 (Originally 05/29/2022)   Zoster Vaccines- Shingrix (1 of 2) 11/10/2022 (Originally 07/01/2018)   Hepatitis C Screening  08/11/2023 (Originally 07/01/1986)   TETANUS/TDAP  03/15/2024   COLONOSCOPY (Pts 45-50yr Insurance coverage will need to be confirmed)  04/05/2024   HIV Screening  Completed   HPV VACCINES  Aged Out   Discussed health benefits of physical activity, and encouraged her to engage in regular exercise appropriate for her age and condition.  Problem List Items Addressed This Visit       Cardiovascular and Mediastinum   HTN (hypertension)    Normal BP today. Reports she is compliant with HCTZ dose and DASH diet. Home BP 150s/80s BP Readings from Last 3 Encounters:  08/10/22 126/80  08/06/22 (!) 131/90  02/07/22 116/78   Advised to bring BP machine to next appt Maintain current med dose        Other   Adjustment disorder with depressed mood   Hyperglycemia   Relevant Orders   Hemoglobin A1c   Vasomotor symptoms due to menopause    Persistent hot flashes and mood swings with 0.'075mg'$  estradiol patch. Last PAP: 2019, 2022 (normal) She agreed to  start effexor and continue to wean off estradiol. Sent 0.'05mg'$  weekly patch and  effexor 37.'5mg'$  daily, schedule appt for mammogram F/up in 11month     Relevant Medications   estradiol (CLIMARA - DOSED IN MG/24 HR) 0.05 mg/24hr patch   venlafaxine XR (EFFEXOR XR) 37.5 MG 24 hr capsule   Other Visit Diagnoses     Encounter for preventative adult health care exam with abnormal findings    -  Primary   Elevated LDL cholesterol level       Relevant Orders   Lipid panel   Hepatic function panel   Breast cancer screening by mammogram       Relevant Orders   MM 3D SCREEN BREAST BILATERAL      Return in about 4 weeks (around 09/07/2022) for depression and anxiety, hot flashes.     CWilfred Lacy NP

## 2022-08-10 NOTE — Assessment & Plan Note (Signed)
Normal BP today. Reports she is compliant with HCTZ dose and DASH diet. Home BP 150s/80s BP Readings from Last 3 Encounters:  08/10/22 126/80  08/06/22 (!) 131/90  02/07/22 116/78   Advised to bring BP machine to next appt Maintain current med dose

## 2022-08-10 NOTE — Assessment & Plan Note (Addendum)
Persistent hot flashes and mood swings with 0.'075mg'$  estradiol patch. Last PAP: 2019, 2022 (normal) She agreed to start effexor and continue to wean off estradiol. Sent 0.'05mg'$  weekly patch and effexor 37.'5mg'$  daily, schedule appt for mammogram F/up in 53month

## 2022-09-03 ENCOUNTER — Other Ambulatory Visit: Payer: Self-pay | Admitting: Nurse Practitioner

## 2022-09-03 DIAGNOSIS — N951 Menopausal and female climacteric states: Secondary | ICD-10-CM

## 2022-09-03 NOTE — Telephone Encounter (Signed)
Chart supports Rx Last OV: 07/2022 Next OV: 08/2022

## 2022-09-07 ENCOUNTER — Encounter: Payer: Self-pay | Admitting: Nurse Practitioner

## 2022-09-07 ENCOUNTER — Ambulatory Visit (INDEPENDENT_AMBULATORY_CARE_PROVIDER_SITE_OTHER): Payer: BC Managed Care – PPO | Admitting: Nurse Practitioner

## 2022-09-07 VITALS — BP 122/82 | HR 75 | Temp 96.8°F | Ht 63.0 in | Wt 203.8 lb

## 2022-09-07 DIAGNOSIS — N951 Menopausal and female climacteric states: Secondary | ICD-10-CM

## 2022-09-07 NOTE — Assessment & Plan Note (Signed)
Daytime somnolence with AM dose of effexor.  Advised to take medication at hs Maintain med dose

## 2022-09-07 NOTE — Patient Instructions (Signed)
Take effexor in PM

## 2022-09-07 NOTE — Progress Notes (Signed)
                Established Patient Visit  Patient: Madeline Hawkins   DOB: [birth year]   56 y.o. Female  MRN: [id number] Visit Date: 09/07/2022  Subjective:    Chief Complaint  Patient presents with   Office Visit    Anxiety/ Depression/ Hot flashes  Says venlafaxine made her sleepy so she stopped taking it     HPI Vasomotor symptoms due to menopause Daytime somnolence with AM dose of effexor.  Advised to take medication at hs Maintain med dose  Reviewed medical, surgical, and social history today  Medications: Outpatient Medications Prior to Visit  Medication Sig   estradiol (CLIMARA - DOSED IN MG/24 HR) 0.05 mg/24hr patch Place 1 patch (0.05 mg total) onto the skin once a week.   hydrochlorothiazide (HYDRODIURIL) 12.5 MG tablet Take 1 tablet (12.5 mg total) by mouth daily.   Multiple Vitamins-Minerals (WOMENS MULTIVITAMIN PLUS) TABS Take by mouth daily.   pantoprazole (PROTONIX) 20 MG tablet TAKE 1 TABLET BY MOUTH EVERY DAY   potassium chloride SA (KLOR-CON M20) 20 MEQ tablet Take 1 tablet (20 mEq total) by mouth daily.   rosuvastatin (CRESTOR) 20 MG tablet TAKE 1 TABLET BY MOUTH EVERY DAY   venlafaxine XR (EFFEXOR XR) 37.5 MG 24 hr capsule Take 1 capsule (37.5 mg total) by mouth daily with breakfast. (Patient not taking: Reported on 09/07/2022)   No facility-administered medications prior to visit.   Reviewed past medical and social history.   ROS per HPI above      Objective:  BP 122/82 (BP Location: Right Arm, Patient Position: Sitting, Cuff Size: Normal)   Pulse 75   Temp (!) 96.8 F (36 C) (Temporal)   Ht '5\' 3"'$  (1.6 m)   Wt 203 lb 12.8 oz (92.4 kg)   LMP 11/10/2011   SpO2 95%   BMI 36.10 kg/m      Physical Exam Cardiovascular:     Rate and Rhythm: Normal rate.     Pulses: Normal pulses.  Pulmonary:     Effort: Pulmonary effort is normal.  Neurological:     Mental Status: She is alert and oriented to person, place, and time.  Psychiatric:         Mood and Affect: Mood normal.        Behavior: Behavior normal.        Thought Content: Thought content normal.     No results found for any visits on 09/07/22.    Assessment & Plan:    Problem List Items Addressed This Visit       Other   Vasomotor symptoms due to menopause - Primary    Daytime somnolence with AM dose of effexor.  Advised to take medication at hs Maintain med dose      Return in about 2 months (around 11/07/2022) for depression and anxiety.     Wilfred Lacy, NP

## 2022-09-09 ENCOUNTER — Other Ambulatory Visit: Payer: Self-pay | Admitting: Nurse Practitioner

## 2022-09-09 DIAGNOSIS — N951 Menopausal and female climacteric states: Secondary | ICD-10-CM

## 2022-09-10 ENCOUNTER — Telehealth: Payer: Self-pay | Admitting: Nurse Practitioner

## 2022-09-10 ENCOUNTER — Other Ambulatory Visit: Payer: Self-pay

## 2022-09-10 DIAGNOSIS — N951 Menopausal and female climacteric states: Secondary | ICD-10-CM

## 2022-09-10 MED ORDER — ESTRADIOL 0.05 MG/24HR TD PTWK: 0.0500 mg | MEDICATED_PATCH | TRANSDERMAL | 0 refills | Status: DC

## 2022-09-10 NOTE — Telephone Encounter (Signed)
Caller Name: Eris Hannan Call back phone #: [phone]  Reason for Call: Pt was told by her pharmacy that estradiol had been denied by [facility]. Please call pt to answer a few questions

## 2022-10-03 ENCOUNTER — Ambulatory Visit
Admission: RE | Admit: 2022-10-03 | Discharge: 2022-10-03 | Disposition: A | Discharge status: Home / Self Care | Payer: BC Managed Care – PPO | Source: Ambulatory Visit | Attending: Nurse Practitioner | Admitting: Nurse Practitioner

## 2022-10-03 DIAGNOSIS — Z1231 Encounter for screening mammogram for malignant neoplasm of breast: Secondary | ICD-10-CM

## 2022-10-08 ENCOUNTER — Encounter: Payer: Self-pay | Admitting: Family Medicine

## 2022-10-08 ENCOUNTER — Other Ambulatory Visit: Payer: Self-pay | Admitting: Nurse Practitioner

## 2022-10-08 ENCOUNTER — Ambulatory Visit (INDEPENDENT_AMBULATORY_CARE_PROVIDER_SITE_OTHER): Payer: BC Managed Care – PPO | Admitting: Family Medicine

## 2022-10-08 VITALS — BP 132/82 | HR 79 | Temp 97.5°F | Wt 207.4 lb

## 2022-10-08 DIAGNOSIS — R059 Cough, unspecified: Secondary | ICD-10-CM | POA: Diagnosis not present

## 2022-10-08 DIAGNOSIS — N951 Menopausal and female climacteric states: Secondary | ICD-10-CM

## 2022-10-08 LAB — POCT INFLUENZA A/B
Influenza A, POC: NEGATIVE
Influenza B, POC: NEGATIVE

## 2022-10-08 LAB — POC COVID19 BINAXNOW: SARS Coronavirus 2 Ag: NEGATIVE

## 2022-10-08 MED ORDER — PROMETHAZINE-DM 6.25-15 MG/5ML PO SYRP: 5.0000 mL | ORAL_SOLUTION | Freq: Four times a day (QID) | ORAL | 0 refills | Status: AC | PRN

## 2022-10-08 MED ORDER — IBUPROFEN 600 MG PO TABS: 600.0000 mg | ORAL_TABLET | Freq: Three times a day (TID) | ORAL | 0 refills | Status: DC | PRN

## 2022-10-08 MED ORDER — FLUTICASONE PROPIONATE 50 MCG/ACT NA SUSP: 2.0000 | Freq: Every day | NASAL | 6 refills | Status: DC

## 2022-10-08 NOTE — Telephone Encounter (Signed)
Chart supports Rx Last OV: 09/2022 Next OV:10/2022

## 2022-10-08 NOTE — Progress Notes (Signed)
Assessment/Plan:   Problem List Items Addressed This Visit   None Visit Diagnoses     Cough, unspecified type    -  Primary   Relevant Medications   promethazine-dextromethorphan (PROMETHAZINE-DM) 6.25-15 MG/5ML syrup   ibuprofen (ADVIL) 600 MG tablet   fluticasone (FLONASE) 50 MCG/ACT nasal spray   Other Relevant Orders   POC COVID-19 (Completed)   POCT Influenza A/B (Completed)     Likely from a viral source.  OTC supportive care plus prescribed remedies as above.  Discussed    Subjective:  HPI:  MAAME DACK is a 56 y.o. female who has HTN (hypertension); Hyperlipidemia; Adjustment disorder with depressed mood; Gastroesophageal reflux disease; Chronic idiopathic constipation; Family history of malignant neoplasm of gastrointestinal tract; Family history of cervical cancer; Intertrigo; Hyperglycemia; Vasomotor symptoms due to menopause; and Family history of colon cancer on their problem list..   She  has a past medical history of Anxiety, Bartholin gland cyst, CIN I (cervical intraepithelial neoplasia I) (1989), GERD (gastroesophageal reflux disease), Hyperlipidemia, and Hypertension..   She presents with chief complaint of Cough (Body aches , cough, chills, x 5 days. ) .   Cough: Patient complains of chills, myalgias, and nonproductive cough.  Symptoms began 5 days ago. She endorses some wheezing this AM. The cough is non-productive, without chest pain dyspnea or hemoptysis and is aggravated by nothing.  Patient does not have a history of asthma. Using OTC ibuprofen, honey, OTC cough syrup,   Past Surgical History:  Procedure Laterality Date   ABDOMINAL HYSTERECTOMY  11/22/2011   Procedure: HYSTERECTOMY ABDOMINAL;  Surgeon: Terrance Mass, MD;  Location: [facility];  Service: Gynecology;  Laterality: N/A;   BARTHOLIN CYST MARSUPIALIZATION Left 12/02/2018   Procedure: BARTHOLIN MARSUPIALIZATION OF LEFT GLAND;  Surgeon: Princess Bruins, MD;  Location: [place];  Service: Gynecology;  Laterality: Left;   colon polyps removed     GYNECOLOGIC CRYOSURGERY  07/07/2007   LAPAROSCOPIC LYSIS OF ADHESIONS  06/28/2015   Procedure: LAPAROSCOPIC LYSIS OF ABDOMINAL PELVIC ADHESIONS;  Surgeon: Terrance Mass, MD;  Location: [facility];  Service: Gynecology;;   LAPAROSCOPIC UNILATERAL SALPINGO OOPHERECTOMY Right 06/28/2015   Procedure: LAPAROSCOPIC RIGHT SALPINGO OOPHORECTOMY;  Surgeon: Terrance Mass, MD;  Location: [facility];  Service: Gynecology;  Laterality: Right;   LAPAROSCOPIC UNILATERAL SALPINGO OOPHERECTOMY Left 10/11/2016   Procedure: LAPAROSCOPIC UNILATERAL OOPHORECTOMY with pelvic washings;  Surgeon: Terrance Mass, MD;  Location: [facility];  Service: Gynecology;  Laterality: Left;  HARMONIC SCAPEL   LAPAROSCOPY  11/22/2011   Procedure: LAPAROSCOPY DIAGNOSTIC;  Surgeon: Terrance Mass, MD;  Location: [facility];  Service: Gynecology;  Laterality: N/A;   OVARIAN CYST REMOVAL  11/22/2011   Procedure: OVARIAN CYSTECTOMY;  Surgeon: Terrance Mass, MD;  Location: [facility];  Service: Gynecology;  Laterality: Left;   SALPINGECTOMY     left for ectopic pregnancy   TUBAL LIGATION     right tubal ligation    Outpatient Medications Prior to Visit  Medication Sig Dispense Refill   hydrochlorothiazide (HYDRODIURIL) 12.5 MG tablet Take 1 tablet (12.5 mg total) by mouth daily. 90 tablet 1   Multiple Vitamins-Minerals (WOMENS MULTIVITAMIN PLUS) TABS Take by mouth daily.     pantoprazole (PROTONIX) 20 MG tablet TAKE 1 TABLET BY MOUTH EVERY DAY 90 tablet 1   potassium chloride SA (KLOR-CON M20) 20 MEQ tablet Take 1 tablet (20 mEq total) by mouth daily. 90 tablet 1   rosuvastatin (CRESTOR) 20 MG tablet TAKE  1 TABLET BY MOUTH EVERY DAY 90 tablet 3   venlafaxine XR (EFFEXOR XR) 37.5 MG 24 hr capsule Take 1 capsule (37.5 mg total) by mouth daily with breakfast. 30 capsule 5   estradiol (CLIMARA - DOSED IN MG/24 HR) 0.05 mg/24hr patch Place 1 patch (0.05 mg total) onto the  skin once a week. 4 patch 0   No facility-administered medications prior to visit.    Family History  Problem Relation Age of Onset   Diabetes Brother    Ovarian cancer Mother    Ovarian cancer Paternal Aunt    Heart disease Maternal Grandmother    Breast cancer Maternal Grandmother        Age 41's   Breast cancer Maternal Aunt        Age 36's    Social History   Socioeconomic History   Marital status: Married    Spouse name: Not on file   Number of children: Not on file   Years of education: Not on file   Highest education level: Not on file  Occupational History   Not on file  Tobacco Use   Smoking status: Never   Smokeless tobacco: Never  Vaping Use   Vaping Use: Never used  Substance and Sexual Activity   Alcohol use: No    Alcohol/week: 0.0 standard drinks of alcohol   Drug use: No   Sexual activity: Yes    Partners: Male    Birth control/protection: Surgical    Comment: 1st intercourse- 19, partners- 30, married- 61 yrs   Other Topics Concern   Not on file  Social History Narrative   Not on file   Social Determinants of Health   Financial Resource Strain: Not on file  Food Insecurity: Not on file  Transportation Needs: Not on file  Physical Activity: Not on file  Stress: Not on file  Social Connections: Not on file  Intimate Partner Violence: Not on file                                                                                                 Objective:  Physical Exam: BP 132/82 (BP Location: Left Arm, Patient Position: Sitting, Cuff Size: Large)   Pulse 79   Temp (!) 97.5 F (36.4 C) (Temporal)   Wt 207 lb 6.4 oz (94.1 kg)   LMP 11/10/2011   SpO2 98%   BMI 36.74 kg/m    General: No acute distress. Awake and conversant.  Eyes: Normal conjunctiva, anicteric. Round symmetric pupils.  ENT: Hearing grossly intact. No nasal discharge.  Neck: Neck is supple. No masses or thyromegaly.  Respiratory: Respirations are non-labored.  Clear to  auscultation bilaterally Skin: Warm. No rashes or ulcers.  Psych: Alert and oriented. Cooperative, Appropriate mood and affect, Normal judgment.  CV: No cyanosis or JVD, RRR no MRG MSK: Normal ambulation. No clubbing  Neuro: Sensation and CN II-XII grossly normal.   Results for orders placed or performed in visit on 10/08/22  POC COVID-19  Result Value Ref Range   SARS Coronavirus 2 Ag Negative Negative  POCT Influenza A/B  Result  Value Ref Range   Influenza A, POC Negative Negative   Influenza B, POC Negative Negative        Alesia Banda, MD, MS

## 2022-10-08 NOTE — Patient Instructions (Signed)
Please be sure to drink plenty of fluids.   You may take the following OTC medications to help with symptoms:  For cough, use  cough syrups or other cough suppressants.  For headache, sore throat, fevers, muscle aches, chills, other pain, take ibuprofen or tylenol  For congestion, use nasal sprays, decongestants, or antihistamines  Please follow up if no improvement.   Go to ED if you have severe chest pain, fevers, shortness of breath or other worrisome symptoms.   

## 2022-11-01 ENCOUNTER — Other Ambulatory Visit: Payer: Self-pay | Admitting: Nurse Practitioner

## 2022-11-01 DIAGNOSIS — N951 Menopausal and female climacteric states: Secondary | ICD-10-CM

## 2022-11-01 NOTE — Telephone Encounter (Signed)
Chart supports Rx Last OV: 09/2022 Next OV: 10/2022

## 2022-11-07 ENCOUNTER — Ambulatory Visit (INDEPENDENT_AMBULATORY_CARE_PROVIDER_SITE_OTHER): Payer: BC Managed Care – PPO | Admitting: Nurse Practitioner

## 2022-11-07 ENCOUNTER — Encounter: Payer: Self-pay | Admitting: Nurse Practitioner

## 2022-11-07 DIAGNOSIS — N951 Menopausal and female climacteric states: Secondary | ICD-10-CM

## 2022-11-07 DIAGNOSIS — I10 Essential (primary) hypertension: Secondary | ICD-10-CM

## 2022-11-07 MED ORDER — POTASSIUM CHLORIDE CRYS ER 20 MEQ PO TBCR: 20.0000 meq | EXTENDED_RELEASE_TABLET | Freq: Every day | ORAL | 1 refills | Status: DC

## 2022-11-07 MED ORDER — VENLAFAXINE HCL ER 37.5 MG PO CP24: 37.5000 mg | ORAL_CAPSULE | Freq: Every day | ORAL | 3 refills | Status: DC

## 2022-11-07 MED ORDER — HYDROCHLOROTHIAZIDE 12.5 MG PO TABS: 12.5000 mg | ORAL_TABLET | Freq: Every day | ORAL | 1 refills | Status: DC

## 2022-11-07 NOTE — Progress Notes (Signed)
                Established Patient Visit  Patient: Madeline Hawkins   DOB: [birth year]   57 y.o. Female  MRN: [id number] Visit Date: 11/07/2022  Subjective:    Chief Complaint  Patient presents with  . Office Visit    Depression/ anxiety  No concerns    HPI Vasomotor symptoms due to menopause Stable mood with effexor Denies any adverse effects  Stop estrogen 0.'05mg'$  patch Maintain effexor dose F/up in 70month  Reviewed medical, surgical, and social history today  Medications: Outpatient Medications Prior to Visit  Medication Sig  . fluticasone (FLONASE) 50 MCG/ACT nasal spray Place 2 sprays into both nostrils daily.  .Marland Kitchenibuprofen (ADVIL) 600 MG tablet Take 1 tablet (600 mg total) by mouth every 8 (eight) hours as needed.  . Multiple Vitamins-Minerals (WOMENS MULTIVITAMIN PLUS) TABS Take by mouth daily.  . pantoprazole (PROTONIX) 20 MG tablet TAKE 1 TABLET BY MOUTH EVERY DAY  . rosuvastatin (CRESTOR) 20 MG tablet TAKE 1 TABLET BY MOUTH EVERY DAY  . [DISCONTINUED] estradiol (CLIMARA - DOSED IN MG/24 HR) 0.05 mg/24hr patch APPLY 1 PATCH ONCE A WEEK  . [DISCONTINUED] potassium chloride SA (KLOR-CON M20) 20 MEQ tablet Take 1 tablet (20 mEq total) by mouth daily.  . [DISCONTINUED] venlafaxine XR (EFFEXOR XR) 37.5 MG 24 hr capsule Take 1 capsule (37.5 mg total) by mouth daily with breakfast.  . [DISCONTINUED] hydrochlorothiazide (HYDRODIURIL) 12.5 MG tablet Take 1 tablet (12.5 mg total) by mouth daily. (Patient not taking: Reported on 11/07/2022)   No facility-administered medications prior to visit.   Reviewed past medical and social history.   ROS per HPI above      Objective:  BP 124/84 (BP Location: Right Arm, Patient Position: Sitting, Cuff Size: Normal)   Pulse 88   Temp (!) 97 F (36.1 C) (Temporal)   Ht '5\' 3"'$  (1.6 m)   Wt 201 lb 6.4 oz (91.4 kg)   LMP 11/10/2011   SpO2 97%   BMI 35.68 kg/m      Physical Exam Neurological:     Mental Status: She is alert and  oriented to person, place, and time.  Psychiatric:        Mood and Affect: Mood normal.        Behavior: Behavior normal.        Thought Content: Thought content normal.    No results found for any visits on 11/07/22.    Assessment & Plan:    Problem List Items Addressed This Visit       Cardiovascular and Mediastinum   HTN (hypertension)   Relevant Medications   hydrochlorothiazide (HYDRODIURIL) 12.5 MG tablet   potassium chloride SA (KLOR-CON M20) 20 MEQ tablet     Other   Vasomotor symptoms due to menopause    Stable mood with effexor Denies any adverse effects  Stop estrogen 0.'05mg'$  patch Maintain effexor dose F/up in 67month     Relevant Medications   venlafaxine XR (EFFEXOR XR) 37.5 MG 24 hr capsule   Other Visit Diagnoses     Essential hypertension       Relevant Medications   hydrochlorothiazide (HYDRODIURIL) 12.5 MG tablet      Return in about 41 weeks (around 08/21/2023) for CPE (fasting).     ChWilfred LacyNP

## 2022-11-07 NOTE — Assessment & Plan Note (Signed)
Stable mood with effexor Denies any adverse effects  Stop estrogen 0.'05mg'$  patch Maintain effexor dose F/up in 31month

## 2022-11-07 NOTE — Patient Instructions (Signed)
Stop estrogen patch  Maintain effexor dose F/up in October for CPE

## 2022-11-25 ENCOUNTER — Other Ambulatory Visit: Payer: Self-pay | Admitting: Nurse Practitioner

## 2022-11-25 DIAGNOSIS — N951 Menopausal and female climacteric states: Secondary | ICD-10-CM

## 2022-12-19 ENCOUNTER — Encounter: Payer: Self-pay | Admitting: Family Medicine

## 2022-12-19 ENCOUNTER — Ambulatory Visit (HOSPITAL_BASED_OUTPATIENT_CLINIC_OR_DEPARTMENT_OTHER)
Admission: RE | Admit: 2022-12-19 | Discharge: 2022-12-19 | Disposition: A | Discharge status: Home / Self Care | Payer: BC Managed Care – PPO | Source: Ambulatory Visit | Attending: Family Medicine | Admitting: Family Medicine

## 2022-12-19 ENCOUNTER — Telehealth (HOSPITAL_BASED_OUTPATIENT_CLINIC_OR_DEPARTMENT_OTHER): Payer: Self-pay

## 2022-12-19 ENCOUNTER — Ambulatory Visit (INDEPENDENT_AMBULATORY_CARE_PROVIDER_SITE_OTHER): Payer: BC Managed Care – PPO | Admitting: Family Medicine

## 2022-12-19 VITALS — BP 124/80 | HR 73 | Temp 97.7°F | Ht 63.0 in | Wt 195.4 lb

## 2022-12-19 DIAGNOSIS — R1030 Lower abdominal pain, unspecified: Secondary | ICD-10-CM | POA: Diagnosis present

## 2022-12-19 HISTORY — DX: Lower abdominal pain, unspecified: R10.30

## 2022-12-19 LAB — COMPREHENSIVE METABOLIC PANEL
ALT: 22 U/L (ref 0–35)
AST: 21 U/L (ref 0–37)
Albumin: 4.6 g/dL (ref 3.5–5.2)
Alkaline Phosphatase: 51 U/L (ref 39–117)
BUN: 13 mg/dL (ref 6–23)
CO2: 30 mEq/L (ref 19–32)
Calcium: 9.8 mg/dL (ref 8.4–10.5)
Chloride: 103 mEq/L (ref 96–112)
Creatinine, Ser: 0.81 mg/dL (ref 0.40–1.20)
GFR: 82.27 mL/min (ref >60.00)
Glucose, Bld: 88 mg/dL (ref 70–99)
Potassium: 4.5 mEq/L (ref 3.5–5.1)
Sodium: 143 mEq/L (ref 135–145)
Total Bilirubin: 0.4 mg/dL (ref 0.2–1.2)
Total Protein: 7.2 g/dL (ref 6.0–8.3)

## 2022-12-19 LAB — CBC WITH DIFFERENTIAL/PLATELET
Basophils Absolute: 0.1 10*3/uL (ref 0.0–0.1)
Basophils Relative: 1.3 % (ref 0.0–3.0)
Eosinophils Absolute: 0 10*3/uL (ref 0.0–0.7)
Eosinophils Relative: 1 % (ref 0.0–5.0)
HCT: 41.6 % (ref 36.0–46.0)
Hemoglobin: 13.4 g/dL (ref 12.0–15.0)
Lymphocytes Relative: 46.2 % — ABNORMAL HIGH (ref 12.0–46.0)
Lymphs Abs: 1.9 10*3/uL (ref 0.7–4.0)
MCHC: 32.1 g/dL (ref 30.0–36.0)
MCV: 85.6 fl (ref 78.0–100.0)
Monocytes Absolute: 0.3 10*3/uL (ref 0.1–1.0)
Monocytes Relative: 7.5 % (ref 3.0–12.0)
Neutro Abs: 1.8 10*3/uL (ref 1.4–7.7)
Neutrophils Relative %: 44 % (ref 43.0–77.0)
Platelets: 197 10*3/uL (ref 150.0–400.0)
RBC: 4.86 Mil/uL (ref 3.87–5.11)
RDW: 16.1 % — ABNORMAL HIGH (ref 11.5–15.5)
WBC: 4.2 10*3/uL (ref 4.0–10.5)

## 2022-12-19 MED ORDER — ONDANSETRON 4 MG PO TBDP: 4.0000 mg | ORAL_TABLET | Freq: Three times a day (TID) | ORAL | 0 refills | Status: DC | PRN

## 2022-12-19 NOTE — Assessment & Plan Note (Signed)
Madeline Hawkins has lower abdominal pain with peritoneal signs and associated N/V/D. I will check a CBC, CMP, and UA. I will refer her for an urgent CT of the abdomen to rule out acute surgical pathology. I will provide some Zofran for nausea and recommend she use Imodium for diarrhea. We will base further management on her testing/imaging results.

## 2022-12-19 NOTE — Progress Notes (Addendum)
[facility] [address] [place] [address] Dept: [phone] Dept Fax: [phone]  Office Visit  Subjective:    Patient ID: Madeline Hawkins, female    DOB: 23-Jul-1966, 57 y.o..   MRN: [id number]  Chief Complaint  Patient presents with   Generalized Body Aches    C/o having N&V, diarrhea, stomach, body aches, chills x 5 days.  Has taken Coricidin HPB.      History of Present Illness:  Patient is in today for complaining of acute onset of nausea 7 days ago. By the following day, she was having nausea, vomiting, and diarrhea, which has persisted. She denies any hematochezia. She also notes lower abdominal pain. She has not been able to keep down food over the past 6 days. She has had some nasal congestion and mild cough. She has felt feverish, but noted her temperature was only mildly elevated. She has had a prior hysterectomy. She has not taken any medicines for this.  Past Medical History: Patient Active Problem List   Diagnosis Date Noted   Family history of colon cancer 02/07/2022   Chronic idiopathic constipation 04/13/2021   Family history of malignant neoplasm of gastrointestinal tract 04/13/2021   Family history of cervical cancer 04/13/2021   Intertrigo 04/13/2021   Hyperglycemia 04/13/2021   Vasomotor symptoms due to menopause 04/13/2021   Gastroesophageal reflux disease 02/01/2016   Adjustment disorder with depressed mood 08/05/2015   Hyperlipidemia 11/14/2012   HTN (hypertension) 02/04/2012   Past Surgical History:  Procedure Laterality Date   ABDOMINAL HYSTERECTOMY  11/22/2011   Procedure: HYSTERECTOMY ABDOMINAL;  Surgeon: Terrance Mass, MD;  Location: [facility];  Service: Gynecology;  Laterality: N/A;   BARTHOLIN CYST MARSUPIALIZATION Left 12/02/2018   Procedure: BARTHOLIN MARSUPIALIZATION OF LEFT GLAND;  Surgeon: Princess Bruins, MD;  Location: [facility];  Service: Gynecology;   Laterality: Left;   colon polyps removed     GYNECOLOGIC CRYOSURGERY  07/07/2007   LAPAROSCOPIC LYSIS OF ADHESIONS  06/28/2015   Procedure: LAPAROSCOPIC LYSIS OF ABDOMINAL PELVIC ADHESIONS;  Surgeon: Terrance Mass, MD;  Location: [facility];  Service: Gynecology;;   LAPAROSCOPIC UNILATERAL SALPINGO OOPHERECTOMY Right 06/28/2015   Procedure: LAPAROSCOPIC RIGHT SALPINGO OOPHORECTOMY;  Surgeon: Terrance Mass, MD;  Location: [facility];  Service: Gynecology;  Laterality: Right;   LAPAROSCOPIC UNILATERAL SALPINGO OOPHERECTOMY Left 10/11/2016   Procedure: LAPAROSCOPIC UNILATERAL OOPHORECTOMY with pelvic washings;  Surgeon: Terrance Mass, MD;  Location: [facility];  Service: Gynecology;  Laterality: Left;  HARMONIC SCAPEL   LAPAROSCOPY  11/22/2011   Procedure: LAPAROSCOPY DIAGNOSTIC;  Surgeon: Terrance Mass, MD;  Location: [facility];  Service: Gynecology;  Laterality: N/A;   OVARIAN CYST REMOVAL  11/22/2011   Procedure: OVARIAN CYSTECTOMY;  Surgeon: Terrance Mass, MD;  Location: [facility];  Service: Gynecology;  Laterality: Left;   SALPINGECTOMY     left for ectopic pregnancy   TUBAL LIGATION     right tubal ligation   Family History  Problem Relation Age of Onset   Diabetes Brother    Ovarian cancer Mother    Ovarian cancer Paternal Aunt    Heart disease Maternal Grandmother    Breast cancer Maternal Grandmother        Age 58's   Breast cancer Maternal Aunt        Age 66's   Outpatient Medications Prior to Visit  Medication Sig Dispense Refill   fluticasone (FLONASE) 50 MCG/ACT nasal spray Place 2 sprays  into both nostrils daily. 16 g 6   hydrochlorothiazide (HYDRODIURIL) 12.5 MG tablet Take 1 tablet (12.5 mg total) by mouth daily. 90 tablet 1   ibuprofen (ADVIL) 600 MG tablet Take 1 tablet (600 mg total) by mouth every 8 (eight) hours as needed. 30 tablet 0   Multiple Vitamins-Minerals (WOMENS MULTIVITAMIN PLUS) TABS Take by mouth daily.     pantoprazole (PROTONIX) 20 MG tablet TAKE 1 TABLET  BY MOUTH EVERY DAY 90 tablet 1   potassium chloride SA (KLOR-CON M20) 20 MEQ tablet Take 1 tablet (20 mEq total) by mouth daily. 90 tablet 1   rosuvastatin (CRESTOR) 20 MG tablet TAKE 1 TABLET BY MOUTH EVERY DAY 90 tablet 3   venlafaxine XR (EFFEXOR XR) 37.5 MG 24 hr capsule Take 1 capsule (37.5 mg total) by mouth daily with breakfast. 90 capsule 3   No facility-administered medications prior to visit.   No Known Allergies   Objective:   Today's Vitals   12/19/22 0800  BP: 124/80  Pulse: 73  Temp: 97.7 F (36.5 C)  TempSrc: Temporal  SpO2: 99%  Weight: 195 lb 6.4 oz (88.6 kg)  Height: 5' 3"$  (1.6 m)   Body mass index is 34.61 kg/m.   General: Well developed, well nourished. No acute distress. HEENT: Normocephalic, non-traumatic. PERRL, EOMI. Conjunctiva clear. External ears normal. EAC and TMs normal   bilaterally. Nose clear without congestion or rhinorrhea. Mucous membranes moist. Oropharynx clear. Good dentition. Lungs: Clear to auscultation bilaterally. No wheezing, rales or rhonchi. CV: RRR without murmurs or rubs. Pulses 2+ bilaterally. Abdomen: Soft. Bowel sounds are infrequent. No hepatosplenomegaly. Tenderness across the lower abdomen, but not   specific to McBurney's point. No guarding, but mild to moderate rebound and some moderate distress with palpation of   the lower abdomen. Back: Straight. No CVA tenderness bilaterally. Extremities: Full ROM. No joint swelling or tenderness. No edema noted. Skin: Warm and dry. No rashes. Psych: Alert and oriented. Normal mood and affect.  Health Maintenance Due  Topic Date Due   COVID-19 Vaccine (1) Never done   Zoster Vaccines- Shingrix (1 of 2) Never done     Assessment & Plan:   Problem List Items Addressed This Visit       Other   Lower abdominal pain - Primary    Ms. Fiers has lower abdominal pain with peritoneal signs and associated N/V/D. I will check a CBC, CMP, and UA. I will refer her for an urgent CT of the  abdomen to rule out acute surgical pathology. I will provide some Zofran for nausea and recommend she use Imodium for diarrhea. We will base further management on her testing/imaging results.      Relevant Medications   ondansetron (ZOFRAN-ODT) 4 MG disintegrating tablet   Other Relevant Orders   CBC with Differential/Platelet (Completed)   Comprehensive metabolic panel (Completed)   Urinalysis w microscopic + reflex cultur   CT Abdomen Pelvis Wo Contrast (Completed)    Return for Follow-up after testing/imaging.   Haydee Salter, MD

## 2022-12-20 LAB — URINALYSIS W MICROSCOPIC + REFLEX CULTURE
Bacteria, UA: NONE SEEN /HPF
Bilirubin Urine: NEGATIVE
Glucose, UA: NEGATIVE
Hgb urine dipstick: NEGATIVE
Hyaline Cast: NONE SEEN /LPF
Ketones, ur: NEGATIVE
Leukocyte Esterase: NEGATIVE
Nitrites, Initial: NEGATIVE
RBC / HPF: NONE SEEN /HPF (ref 0–2)
Specific Gravity, Urine: 1.03 (ref 1.001–1.035)
Squamous Epithelial / HPF: NONE SEEN /HPF (ref <=5)
WBC, UA: NONE SEEN /HPF (ref 0–5)
pH: 8.5 — ABNORMAL HIGH (ref 5.0–8.0)

## 2022-12-20 LAB — NO CULTURE INDICATED

## 2022-12-25 NOTE — Telephone Encounter (Signed)
Caller Name: Raahi Call back phone #: [phone]  Reason for Call: Pt had a CT done on last Wednesday and would like a callfor the results.

## 2022-12-26 NOTE — Telephone Encounter (Signed)
Called patient and made aware of Dr. Juliane Lack results

## 2023-01-17 ENCOUNTER — Encounter: Payer: Self-pay | Admitting: Nurse Practitioner

## 2023-01-17 ENCOUNTER — Ambulatory Visit (INDEPENDENT_AMBULATORY_CARE_PROVIDER_SITE_OTHER): Payer: BC Managed Care – PPO | Admitting: Nurse Practitioner

## 2023-01-17 VITALS — BP 128/80 | HR 88 | Temp 98.3°F | Ht 63.0 in | Wt 198.2 lb

## 2023-01-17 DIAGNOSIS — N3000 Acute cystitis without hematuria: Secondary | ICD-10-CM

## 2023-01-17 DIAGNOSIS — R309 Painful micturition, unspecified: Secondary | ICD-10-CM | POA: Diagnosis not present

## 2023-01-17 LAB — POC URINALSYSI DIPSTICK (AUTOMATED)
Bilirubin, UA: NEGATIVE
Blood, UA: NEGATIVE
Glucose, UA: NEGATIVE
Ketones, UA: NEGATIVE
Nitrite, UA: NEGATIVE
Protein, UA: NEGATIVE
Spec Grav, UA: >=1.03 — AB (ref 1.010–1.025)
Urobilinogen, UA: 0.2 E.U./dL
pH, UA: 5 (ref 5.0–8.0)

## 2023-01-17 MED ORDER — PHENAZOPYRIDINE HCL 100 MG PO TABS: 100.0000 mg | ORAL_TABLET | Freq: Three times a day (TID) | ORAL | 0 refills | Status: DC | PRN

## 2023-01-17 MED ORDER — NITROFURANTOIN MONOHYD MACRO 100 MG PO CAPS: 100.0000 mg | ORAL_CAPSULE | Freq: Two times a day (BID) | ORAL | 0 refills | Status: DC

## 2023-01-17 NOTE — Progress Notes (Signed)
Acute Office Visit  Subjective:     Patient ID: Madeline Hawkins, female    DOB: 21-Aug-1966, 57 y.o.   MRN: [id number]  Chief Complaint  Patient presents with   Dysuria    started yesterday    HPI Patient is in today for dysuria that started yesterday.  URINARY SYMPTOMS  Dysuria: burning Urinary frequency: yes Urgency: yes Small volume voids: yes Symptom severity: moderate Urinary incontinence: no Foul odor: no Hematuria: no Abdominal pain:  no Back pain:  slight Suprapubic pain/pressure:  slight Flank pain: no Fever:  no Vomiting: no - having nausea  Relief with cranberry juice: no Relief with pyridium:  n/a Status: stable Previous urinary tract infection: no Recurrent urinary tract infection: no Treatments attempted: cranberry and increasing fluids   ROS See pertinent positives and negatives per HPI.     Objective:    BP 128/80 (BP Location: Right Arm)   Pulse 88   Temp 98.3 F (36.8 C)   Ht 5\' 3"  (1.6 m)   Wt 198 lb 3.2 oz (89.9 kg)   LMP 11/10/2011   SpO2 98%   BMI 35.11 kg/m    Physical Exam Vitals and nursing note reviewed.  Constitutional:      General: She is not in acute distress.    Appearance: Normal appearance.  HENT:     Head: Normocephalic.  Eyes:     Conjunctiva/sclera: Conjunctivae normal.  Pulmonary:     Effort: Pulmonary effort is normal.  Abdominal:     General: There is no distension.     Palpations: Abdomen is soft.     Tenderness: There is no abdominal tenderness. There is no right CVA tenderness, left CVA tenderness or guarding.  Musculoskeletal:     Cervical back: Normal range of motion.  Skin:    General: Skin is warm.  Neurological:     General: No focal deficit present.     Mental Status: She is alert and oriented to person, place, and time.  Psychiatric:        Mood and Affect: Mood normal.        Behavior: Behavior normal.        Thought Content: Thought content normal.        Judgment: Judgment normal.      Results for orders placed or performed in visit on 01/17/23  Urine Culture   Specimen: Urine  Result Value Ref Range   MICRO NUMBER: [id number]    SPECIMEN QUALITY: Adequate    Sample Source URINE    STATUS: FINAL    Result:      Mixed genital flora isolated. These superficial bacteria are not indicative of a urinary tract infection. No further organism identification is warranted on this specimen. If clinically indicated, recollect clean-catch, mid-stream urine and transfer  immediately to Urine Culture Transport Tube.   POCT Urinalysis Dipstick (Automated)  Result Value Ref Range   Color, UA yellow    Clarity, UA     Glucose, UA Negative Negative   Bilirubin, UA Negative    Ketones, UA Negative    Spec Grav, UA >=1.030 (A) 1.010 - 1.025   Blood, UA Negative    pH, UA 5.0 5.0 - 8.0   Protein, UA Negative Negative   Urobilinogen, UA 0.2 0.2 or 1.0 E.U./dL   Nitrite, UA Negative    Leukocytes, UA Trace (A) Negative        Assessment & Plan:   Problem List Items Addressed This Visit  None Visit Diagnoses     Acute cystitis without hematuria    -  Primary   Treat with macrobid BID x 5 days. Encourage fluids. Add on urine culture. F/U if not improving   Relevant Orders   Urine Culture (Completed)   Pain with urination       U/A showed trace leukocytes. With symptoms will treat for UTI. Pyridium 100mg  TID prn dysuria. See plan above for UTI.   Relevant Orders   POCT Urinalysis Dipstick (Automated) (Completed)       Meds ordered this encounter  Medications   nitrofurantoin, macrocrystal-monohydrate, (MACROBID) 100 MG capsule    Sig: Take 1 capsule (100 mg total) by mouth 2 (two) times daily.    Dispense:  10 capsule    Refill:  0   phenazopyridine (PYRIDIUM) 100 MG tablet    Sig: Take 1 tablet (100 mg total) by mouth 3 (three) times daily as needed for pain.    Dispense:  10 tablet    Refill:  0    No follow-ups on file.  Charyl Dancer, NP

## 2023-01-17 NOTE — Patient Instructions (Signed)
It was great to see you!  Start macrobid twice a day for 5 days  Start pyridium 3 times a day as needed for burning with urination. This may turn your urine orange.   Keep drinking plenty of fluids.   Let's follow-up if your symptoms worsen or don't improve.   Take care,  Vance Peper, NP

## 2023-01-18 LAB — URINE CULTURE
MICRO NUMBER:: 14723879
SPECIMEN QUALITY:: ADEQUATE

## 2023-01-30 ENCOUNTER — Other Ambulatory Visit: Payer: Self-pay | Admitting: Family Medicine

## 2023-01-30 DIAGNOSIS — K219 Gastro-esophageal reflux disease without esophagitis: Secondary | ICD-10-CM

## 2023-03-13 ENCOUNTER — Encounter: Payer: Self-pay | Admitting: Nurse Practitioner

## 2023-03-13 ENCOUNTER — Ambulatory Visit (INDEPENDENT_AMBULATORY_CARE_PROVIDER_SITE_OTHER): Payer: BC Managed Care – PPO | Admitting: Nurse Practitioner

## 2023-03-13 VITALS — BP 130/80 | HR 81 | Temp 97.9°F | Resp 16 | Ht 63.0 in | Wt 200.8 lb

## 2023-03-13 DIAGNOSIS — H1031 Unspecified acute conjunctivitis, right eye: Secondary | ICD-10-CM | POA: Diagnosis not present

## 2023-03-13 MED ORDER — NEOMYCIN-POLYMYXIN-GRAMICIDIN 1.75-10000-.025 OP SOLN: 2.0000 [drp] | Freq: Four times a day (QID) | OPHTHALMIC | 0 refills | Status: DC

## 2023-03-13 NOTE — Patient Instructions (Signed)
Bacterial Conjunctivitis, Adult Bacterial conjunctivitis is an infection of the clear membrane that covers the white part of the eye and the inner surface of the eyelid (conjunctiva). When the blood vessels in the conjunctiva become inflamed, the eye becomes red or pink. The eye often feels irritated or itchy. Bacterial conjunctivitis spreads easily from person to person (is contagious). It also spreads easily from one eye to the other eye. What are the causes? This condition is caused by bacteria. You may get the infection if you come into close contact with: A person who is infected with the bacteria. Items that are contaminated with the bacteria, such as a face towel, contact lens solution, or eye makeup. What increases the risk? You are more likely to develop this condition if: You are exposed to other people who have the infection. You wear contact lenses. You have a sinus infection. You have had a recent eye injury or surgery. You have a weak body defense system (immune system). You have a medical condition that causes dry eyes. What are the signs or symptoms? Symptoms of this condition include: Thick, yellowish discharge from the eye. This may turn into a crust on the eyelid overnight and cause your eyelids to stick together. Tearing or watery eyes. Itchy eyes. Burning feeling in your eyes. Eye redness. Swollen eyelids. Blurred vision. How is this diagnosed? This condition is diagnosed based on your symptoms and medical history. Your health care provider may also take a sample of discharge from your eye to find the cause of your infection. How is this treated? This condition may be treated with: Antibiotic eye drops or ointment to clear the infection more quickly and prevent the spread of infection to others. Antibiotic medicines taken by mouth (orally) to treat infections that do not respond to drops or ointments or that last longer than 10 days. Cool, wet cloths (cool  compresses) placed on the eyes. Artificial tears applied 2-6 times a day. Follow these instructions at home: Medicines Take or apply your antibiotic medicine as told by your health care provider. Do not stop using the antibiotic, even if your condition improves, unless directed by your health care provider. Take or apply over-the-counter and prescription medicines only as told by your health care provider. Be very careful to avoid touching the edge of your eyelid with the eye-drop bottle or the ointment tube when you apply medicines to the affected eye. This will keep you from spreading the infection to your other eye or to other people. Managing discomfort Gently wipe away any drainage from your eye with a warm, wet washcloth or a cotton ball. Apply a clean, cool compress to your eye for 10-20 minutes, 3-4 times a day. General instructions Do not wear contact lenses until the inflammation is gone and your health care provider says it is safe to wear them again. Ask your health care provider how to sterilize or replace your contact lenses before you use them again. Wear glasses until you can resume wearing contact lenses. Avoid wearing eye makeup until the inflammation is gone. Throw away any old eye cosmetics that may be contaminated. Change or wash your pillowcase every day. Do not share towels or washcloths. This may spread the infection. Wash your hands often with soap and water for at least 20 seconds and especially before touching your face or eyes. Use paper towels to dry your hands. Avoid touching or rubbing your eyes. Do not drive or use heavy machinery if your vision is blurred. Contact   a health care provider if: You have a fever. Your symptoms do not get better after 10 days. Get help right away if: You have a fever and your symptoms suddenly get worse. You have severe pain when you move your eye. You have facial pain, redness, or swelling. You have a sudden loss of  vision. Summary Bacterial conjunctivitis is an infection of the clear membrane that covers the white part of the eye and the inner surface of the eyelid (conjunctiva). Bacterial conjunctivitis spreads easily from eye to eye and from person to person (is contagious). Wash your hands often with soap and water for at least 20 seconds and especially before touching your face or eyes. Use paper towels to dry your hands. Take or apply your antibiotic medicine as told by your health care provider. Do not stop using the antibiotic even if your condition improves. Contact a health care provider if you have a fever or if your symptoms do not get better after 10 days. Get help right away if you have a sudden loss of vision. This information is not intended to replace advice given to you by your health care provider. Make sure you discuss any questions you have with your health care provider. Document Revised: 01/25/2021 Document Reviewed: 01/25/2021 Elsevier Patient Education  2023 [employer] Inc[employer]  

## 2023-03-13 NOTE — Progress Notes (Signed)
Acute Office Visit  Subjective:    Patient ID: Madeline Hawkins, female    DOB: 12/23/1965, 57 y.o.   MRN: [id number]  Chief Complaint  Patient presents with   Eye Problem    Right eye is swollen, redness, draining, itchy and aching.  Woke up like this.     Eye Problem  The right eye is affected. This is a new problem. The current episode started today. The problem occurs constantly. The problem has been unchanged. There was no injury mechanism. The pain is mild. There is Known exposure to pink eye. She Does not wear contacts. Associated symptoms include an eye discharge, eye redness, a foreign body sensation and itching. Pertinent negatives include no blurred vision, double vision, fever, nausea, photophobia, recent URI or vomiting. Treatments tried: cold compress.   Outpatient Medications Prior to Visit  Medication Sig   fluticasone (FLONASE) 50 MCG/ACT nasal spray Place 2 sprays into both nostrils daily.   hydrochlorothiazide (HYDRODIURIL) 12.5 MG tablet Take 1 tablet (12.5 mg total) by mouth daily.   ibuprofen (ADVIL) 600 MG tablet Take 1 tablet (600 mg total) by mouth every 8 (eight) hours as needed.   Multiple Vitamins-Minerals (WOMENS MULTIVITAMIN PLUS) TABS Take by mouth daily.   ondansetron (ZOFRAN-ODT) 4 MG disintegrating tablet Take 1 tablet (4 mg total) by mouth every 8 (eight) hours as needed for nausea or vomiting.   pantoprazole (PROTONIX) 20 MG tablet TAKE 1 TABLET BY MOUTH EVERY DAY   potassium chloride SA (KLOR-CON M20) 20 MEQ tablet Take 1 tablet (20 mEq total) by mouth daily.   rosuvastatin (CRESTOR) 20 MG tablet TAKE 1 TABLET BY MOUTH EVERY DAY   venlafaxine XR (EFFEXOR XR) 37.5 MG 24 hr capsule Take 1 capsule (37.5 mg total) by mouth daily with breakfast.   nitrofurantoin, macrocrystal-monohydrate, (MACROBID) 100 MG capsule Take 1 capsule (100 mg total) by mouth 2 (two) times daily. (Patient not taking: Reported on 03/13/2023)   phenazopyridine (PYRIDIUM) 100 MG tablet  Take 1 tablet (100 mg total) by mouth 3 (three) times daily as needed for pain. (Patient not taking: Reported on 03/13/2023)   No facility-administered medications prior to visit.    Reviewed past medical and social history.  Review of Systems  Constitutional:  Negative for fever.  HENT:  Negative for congestion and sinus pain.   Eyes:  Positive for discharge, redness and itching. Negative for blurred vision, double vision and photophobia.  Gastrointestinal:  Negative for nausea and vomiting.   Per HPI     Objective:    Physical Exam Vitals reviewed.  Eyes:     General: Lids are normal.        Right eye: No foreign body or hordeolum.        Left eye: No foreign body, discharge or hordeolum.     Extraocular Movements: Extraocular movements intact.     Conjunctiva/sclera:     Right eye: Right conjunctiva is injected. No exudate or hemorrhage.    Left eye: Left conjunctiva is not injected. No chemosis, exudate or hemorrhage.   BP 130/80 (BP Location: Right Arm, Patient Position: Sitting, Cuff Size: Large)   Pulse 81   Temp 97.9 F (36.6 C) (Temporal)   Resp 16   Ht 5\' 3"  (1.6 m)   Wt 200 lb 12.8 oz (91.1 kg)   LMP 11/10/2011   SpO2 98%   BMI 35.57 kg/m    No results found for any visits on 03/13/23.     Assessment &  Plan:   Problem List Items Addressed This Visit   None Visit Diagnoses     Acute bacterial conjunctivitis of right eye    -  Primary   Relevant Medications   neomycin-polymyxin-gramicidin (NEOSPORIN) 1.75-10000-.025 ophthalmic solution      Meds ordered this encounter  Medications   neomycin-polymyxin-gramicidin (NEOSPORIN) 1.75-10000-.025 ophthalmic solution    Sig: Place 2 drops into the right eye 4 (four) times daily. X 10days    Dispense:  10 mL    Refill:  0    Order Specific Question:   Supervising Provider    Answer:   Mliss Sax [[id number]]   Return if symptoms worsen or fail to improve.    [name], NP

## 2023-05-10 ENCOUNTER — Other Ambulatory Visit: Payer: Self-pay | Admitting: Nurse Practitioner

## 2023-05-10 DIAGNOSIS — E785 Hyperlipidemia, unspecified: Secondary | ICD-10-CM

## 2023-05-10 DIAGNOSIS — I10 Essential (primary) hypertension: Secondary | ICD-10-CM

## 2023-05-21 ENCOUNTER — Emergency Department (HOSPITAL_BASED_OUTPATIENT_CLINIC_OR_DEPARTMENT_OTHER)
Admission: EM | Admit: 2023-05-21 | Discharge: 2023-05-21 | Disposition: A | Discharge status: Home / Self Care | Payer: BC Managed Care – PPO | Attending: Emergency Medicine | Admitting: Emergency Medicine

## 2023-05-21 ENCOUNTER — Encounter (HOSPITAL_BASED_OUTPATIENT_CLINIC_OR_DEPARTMENT_OTHER): Payer: Self-pay | Admitting: Emergency Medicine

## 2023-05-21 ENCOUNTER — Other Ambulatory Visit: Payer: Self-pay

## 2023-05-21 ENCOUNTER — Emergency Department (HOSPITAL_BASED_OUTPATIENT_CLINIC_OR_DEPARTMENT_OTHER): Payer: BC Managed Care – PPO

## 2023-05-21 DIAGNOSIS — M25572 Pain in left ankle and joints of left foot: Secondary | ICD-10-CM | POA: Insufficient documentation

## 2023-05-21 DIAGNOSIS — M79675 Pain in left toe(s): Secondary | ICD-10-CM | POA: Insufficient documentation

## 2023-05-21 DIAGNOSIS — X500XXA Overexertion from strenuous movement or load, initial encounter: Secondary | ICD-10-CM | POA: Diagnosis not present

## 2023-05-21 DIAGNOSIS — I10 Essential (primary) hypertension: Secondary | ICD-10-CM | POA: Insufficient documentation

## 2023-05-21 MED ORDER — ACETAMINOPHEN 500 MG PO TABS
1000.0000 mg | ORAL_TABLET | Freq: Once | ORAL | Status: AC
  Administered 2023-05-21: 1000 mg via ORAL
  Filled 2023-05-21: qty 2

## 2023-05-21 NOTE — ED Triage Notes (Signed)
Pt endorses left toe pain that radiates to her ankle since yesterday. Denies any known trauma or wounds.

## 2023-05-21 NOTE — ED Notes (Signed)
Reviewed discharge instructions with pt. Pt states understanding. Ambulatory at discharge

## 2023-05-21 NOTE — ED Provider Notes (Signed)
[facility] Provider Note   CSN: [id number] Arrival date & time: 05/21/23  1045     History  Chief Complaint  Patient presents with   Foot Pain    Madeline Hawkins is a 57 y.o. female history of GERD, hyperlipidemia, hypertension presents today for evaluation of left foot pain.  Patient states she was trying to lift a heavy box yesterday when she started to have pain in her left ankle.  States the pain goes from the back of her ankle to the middle of her left calf.  She denies any leg swelling.  No personal or family history of PE/DVT.  She also complaining about pain in the fourth and fifth left toes.  She denies any direct injury to these toes.  Denies fever.   Foot Pain      Past Medical History:  Diagnosis Date   Anxiety    Bartholin gland cyst    CIN I (cervical intraepithelial neoplasia I) 1989   cryo...also in 2008 .. had cryo    GERD (gastroesophageal reflux disease)    Hyperlipidemia    Hypertension    Past Surgical History:  Procedure Laterality Date   ABDOMINAL HYSTERECTOMY  11/22/2011   Procedure: HYSTERECTOMY ABDOMINAL;  Surgeon: Ok Edwards, MD;  Location: [facility];  Service: Gynecology;  Laterality: N/A;   BARTHOLIN CYST MARSUPIALIZATION Left 12/02/2018   Procedure: BARTHOLIN MARSUPIALIZATION OF LEFT GLAND;  Surgeon: Genia Del, MD;  Location: [facility];  Service: Gynecology;  Laterality: Left;   colon polyps removed     GYNECOLOGIC CRYOSURGERY  07/07/2007   LAPAROSCOPIC LYSIS OF ADHESIONS  06/28/2015   Procedure: LAPAROSCOPIC LYSIS OF ABDOMINAL PELVIC ADHESIONS;  Surgeon: Ok Edwards, MD;  Location: [facility];  Service: Gynecology;;   LAPAROSCOPIC UNILATERAL SALPINGO OOPHERECTOMY Right 06/28/2015   Procedure: LAPAROSCOPIC RIGHT SALPINGO OOPHORECTOMY;  Surgeon: Ok Edwards, MD;  Location: [facility];  Service: Gynecology;  Laterality: Right;   LAPAROSCOPIC UNILATERAL SALPINGO  OOPHERECTOMY Left 10/11/2016   Procedure: LAPAROSCOPIC UNILATERAL OOPHORECTOMY with pelvic washings;  Surgeon: Ok Edwards, MD;  Location: [facility];  Service: Gynecology;  Laterality: Left;  HARMONIC SCAPEL   LAPAROSCOPY  11/22/2011   Procedure: LAPAROSCOPY DIAGNOSTIC;  Surgeon: Ok Edwards, MD;  Location: [facility];  Service: Gynecology;  Laterality: N/A;   OVARIAN CYST REMOVAL  11/22/2011   Procedure: OVARIAN CYSTECTOMY;  Surgeon: Ok Edwards, MD;  Location: [facility];  Service: Gynecology;  Laterality: Left;   SALPINGECTOMY     left for ectopic pregnancy   TUBAL LIGATION     right tubal ligation     Home Medications Prior to Admission medications   Medication Sig Start Date End Date Taking? Authorizing Provider  fluticasone (FLONASE) 50 MCG/ACT nasal spray Place 2 sprays into both nostrils daily. 10/08/22   Garnette Gunner, MD  hydrochlorothiazide (HYDRODIURIL) 12.5 MG tablet TAKE 1 TABLET BY MOUTH EVERY DAY 05/10/23   Nche, Bonna Gains, NP  ibuprofen (ADVIL) 600 MG tablet Take 1 tablet (600 mg total) by mouth every 8 (eight) hours as needed. 10/08/22   Garnette Gunner, MD  KLOR-CON M20 20 MEQ tablet TAKE 1 TABLET BY MOUTH EVERY DAY 05/10/23   Nche, Bonna Gains, NP  Multiple Vitamins-Minerals (WOMENS MULTIVITAMIN PLUS) TABS Take by mouth daily.    [provider]  neomycin-polymyxin-gramicidin (NEOSPORIN) 1.75-10000-.025 ophthalmic solution Place 2 drops into the right eye 4 (four) times daily. X 10days 03/13/23  Nche, Bonna Gains, NP  nitrofurantoin, macrocrystal-monohydrate, (MACROBID) 100 MG capsule Take 1 capsule (100 mg total) by mouth 2 (two) times daily. Patient not taking: Reported on 03/13/2023 01/17/23   Gerre Scull, NP  ondansetron (ZOFRAN-ODT) 4 MG disintegrating tablet Take 1 tablet (4 mg total) by mouth every 8 (eight) hours as needed for nausea or vomiting. 12/19/22   Loyola Mast, MD  pantoprazole (PROTONIX) 20 MG tablet TAKE 1 TABLET BY MOUTH  EVERY DAY 01/31/23   Nche, Bonna Gains, NP  phenazopyridine (PYRIDIUM) 100 MG tablet Take 1 tablet (100 mg total) by mouth 3 (three) times daily as needed for pain. Patient not taking: Reported on 03/13/2023 01/17/23   Rodman Pickle A, NP  rosuvastatin (CRESTOR) 20 MG tablet TAKE 1 TABLET BY MOUTH EVERY DAY 05/10/23   Nche, Bonna Gains, NP  venlafaxine XR (EFFEXOR XR) 37.5 MG 24 hr capsule Take 1 capsule (37.5 mg total) by mouth daily with breakfast. 11/07/22   Nche, Bonna Gains, NP      Allergies    Patient has no known allergies.    Review of Systems   Review of Systems Negative except as per HPI.  Physical Exam Updated Vital Signs BP (!) 133/94   Pulse 86   Temp 98 F (36.7 C) (Oral)   Resp 16   Ht 5\' 3"  (1.6 m)   Wt 90.7 kg   LMP 11/10/2011   SpO2 97%   BMI 35.43 kg/m  Physical Exam Vitals and nursing note reviewed.  Constitutional:      Appearance: Normal appearance.  HENT:     Head: Normocephalic and atraumatic.     Mouth/Throat:     Mouth: Mucous membranes are moist.  Eyes:     General: No scleral icterus. Cardiovascular:     Rate and Rhythm: Normal rate and regular rhythm.     Pulses: Normal pulses.     Heart sounds: Normal heart sounds.  Pulmonary:     Effort: Pulmonary effort is normal.     Breath sounds: Normal breath sounds.  Abdominal:     General: Abdomen is flat.     Palpations: Abdomen is soft.     Tenderness: There is no abdominal tenderness.  Musculoskeletal:        General: No deformity.     Comments: Tenderness to palpation to left medial ankle.  No swelling noted to left leg.  No tenderness to palpation to the left calf.   Skin:    General: Skin is warm.     Findings: No rash.     Comments: No skin erythema noted to the left foot, ankle or lower leg.  Neurological:     General: No focal deficit present.     Mental Status: She is alert.  Psychiatric:        Mood and Affect: Mood normal.     ED Results / Procedures / Treatments    Labs (all labs ordered are listed, but only abnormal results are displayed) Labs Reviewed - No data to display  EKG None  Radiology DG Foot Complete Left  Result Date: 05/21/2023 CLINICAL DATA:  Left toe pain radiating to her ankle. EXAM: LEFT FOOT - COMPLETE 3+ VIEW COMPARISON:  Left ankle radiographs 05/21/2023. FINDINGS: Three views of the left foot. No evidence of fracture or dislocation. There is no evidence of arthropathy or other focal bone abnormality. Soft tissues are unremarkable. IMPRESSION: Negative left foot radiographs. Electronically Signed   By: Elwyn Reach.D.  On: 05/21/2023 12:11   DG Ankle Complete Left  Result Date: 05/21/2023 CLINICAL DATA:  Pain EXAM: LEFT ANKLE COMPLETE - 3 VIEW COMPARISON:  None Available. FINDINGS: There is no evidence of fracture, dislocation, or joint effusion. There is no evidence of arthropathy or other focal bone abnormality. Soft tissues are unremarkable. Tiny Achilles and plantar calcaneal spurs. IMPRESSION: No acute osseous abnormality. Electronically Signed   By: Karen Kays M.D.   On: 05/21/2023 12:08    Procedures Procedures    Medications Ordered in ED Medications  acetaminophen (TYLENOL) tablet 1,000 mg (has no administration in time range)    ED Course/ Medical Decision Making/ A&P                             Medical Decision Making Amount and/or Complexity of Data Reviewed Radiology: ordered.  Risk OTC drugs.   This patient presents to the ED for toe pain, foot pain, ankle pain, this involves an extensive number of treatment options, and is a complaint that carries with a high risk of complications and morbidity.  The differential diagnosis includes fracture, dislocation, cellulitis, ligamentous injury.  This is not an exhaustive list.  Imaging studies: I ordered imaging studies, personally reviewed, interpreted imaging and agree with the radiologist's interpretations. The results include: X-ray of left foot  and ankle show no acute osseous abnormalities.  Problem list/ ED course/ Critical interventions/ Medical management: HPI: See above Vital signs within normal range and stable throughout visit. Laboratory/imaging studies significant for: See above. On physical examination, patient is afebrile and appears in no acute distress. Patient presents with left toe, foot, ankle pain. Given history, exam and workup patient likely has musculoskeletal pain versus ankle sprain. I have low suspicion for fracture, dislocation, significant ligamentous injury, septic arthritis, gout flare, new autoimmune arthropathy, or gonococcal arthropathy. Given ankle brace. Advised patient to take Tylenol/ibuprofen/naproxen for pain, follow-up with primary care physician for further evaluation and management, return to the ER if new or worsening symptoms. I have reviewed the patient home medicines and have made adjustments as needed.  Cardiac monitoring/EKG: The patient was maintained on a cardiac monitor.  I personally reviewed and interpreted the cardiac monitor which showed an underlying rhythm of: sinus rhythm.  Additional history obtained: External records from outside source obtained and reviewed including: Chart review including previous notes, labs, imaging.  Consultations obtained:  Disposition Continued outpatient therapy. Follow-up with PCP recommended for reevaluation of symptoms. Treatment plan discussed with patient.  Pt acknowledged understanding was agreeable to the plan. Worrisome signs and symptoms were discussed with patient, and patient acknowledged understanding to return to the ED if they noticed these signs and symptoms. Patient was stable upon discharge.   This chart was dictated using voice recognition software.  Despite best efforts to proofread,  errors can occur which can change the documentation meaning.          Final Clinical Impression(s) / ED Diagnoses Final diagnoses:  Toe pain,  left  Acute left ankle pain    Rx / DC Orders ED Discharge Orders     None         Jeanelle Malling, [place] 05/21/23 Aldona Lento    Pricilla Loveless, MD 05/22/23 [phone]

## 2023-05-21 NOTE — Discharge Instructions (Addendum)
Please take tylenol/ibuprofen  every 6 hours for pain, use ice, rest, elevate your leg for symptom relief. I recommend close follow-up with PCP or orthopedics for reevaluation.  Please do not hesitate to return to emergency department if worrisome signs symptoms we discussed become apparent.

## 2023-07-31 ENCOUNTER — Other Ambulatory Visit: Payer: Self-pay | Admitting: Nurse Practitioner

## 2023-07-31 DIAGNOSIS — K219 Gastro-esophageal reflux disease without esophagitis: Secondary | ICD-10-CM

## 2023-08-14 ENCOUNTER — Ambulatory Visit: Payer: BC Managed Care – PPO | Admitting: Nurse Practitioner

## 2023-08-14 ENCOUNTER — Encounter: Payer: Self-pay | Admitting: Nurse Practitioner

## 2023-08-14 VITALS — BP 120/74 | HR 81 | Temp 98.0°F | Ht 63.0 in | Wt 196.6 lb

## 2023-08-14 DIAGNOSIS — I10 Essential (primary) hypertension: Secondary | ICD-10-CM | POA: Diagnosis not present

## 2023-08-14 DIAGNOSIS — M542 Cervicalgia: Secondary | ICD-10-CM | POA: Diagnosis not present

## 2023-08-14 DIAGNOSIS — E785 Hyperlipidemia, unspecified: Secondary | ICD-10-CM

## 2023-08-14 DIAGNOSIS — Z0000 Encounter for general adult medical examination without abnormal findings: Secondary | ICD-10-CM

## 2023-08-14 DIAGNOSIS — M25512 Pain in left shoulder: Secondary | ICD-10-CM | POA: Diagnosis not present

## 2023-08-14 DIAGNOSIS — Z780 Asymptomatic menopausal state: Secondary | ICD-10-CM | POA: Diagnosis not present

## 2023-08-14 DIAGNOSIS — N951 Menopausal and female climacteric states: Secondary | ICD-10-CM | POA: Diagnosis not present

## 2023-08-14 DIAGNOSIS — K219 Gastro-esophageal reflux disease without esophagitis: Secondary | ICD-10-CM | POA: Diagnosis not present

## 2023-08-14 DIAGNOSIS — R739 Hyperglycemia, unspecified: Secondary | ICD-10-CM | POA: Diagnosis not present

## 2023-08-14 DIAGNOSIS — Z0001 Encounter for general adult medical examination with abnormal findings: Secondary | ICD-10-CM

## 2023-08-14 LAB — LIPID PANEL
Cholesterol: 182 mg/dL (ref 0–200)
HDL: 53.4 mg/dL (ref >39.00)
LDL Cholesterol: 116 mg/dL — ABNORMAL HIGH (ref 0–99)
NonHDL: 128.36
Total CHOL/HDL Ratio: 3
Triglycerides: 63 mg/dL (ref 0.0–149.0)
VLDL: 12.6 mg/dL (ref 0.0–40.0)

## 2023-08-14 LAB — RENAL FUNCTION PANEL
Albumin: 4.5 g/dL (ref 3.5–5.2)
BUN: 14 mg/dL (ref 6–23)
CO2: 31 meq/L (ref 19–32)
Calcium: 9.8 mg/dL (ref 8.4–10.5)
Chloride: 105 meq/L (ref 96–112)
Creatinine, Ser: 0.81 mg/dL (ref 0.40–1.20)
GFR: 81.89 mL/min (ref >60.00)
Glucose, Bld: 56 mg/dL — ABNORMAL LOW (ref 70–99)
Phosphorus: 2.9 mg/dL (ref 2.3–4.6)
Potassium: 3.7 meq/L (ref 3.5–5.1)
Sodium: 143 meq/L (ref 135–145)

## 2023-08-14 LAB — HEMOGLOBIN A1C: Hgb A1c MFr Bld: 5.9 % (ref 4.6–6.5)

## 2023-08-14 MED ORDER — METHOCARBAMOL 500 MG PO TABS
500.0000 mg | ORAL_TABLET | Freq: Three times a day (TID) | ORAL | 0 refills | Status: DC | PRN
Start: 2023-08-14 — End: 2024-08-26

## 2023-08-14 MED ORDER — VENLAFAXINE HCL ER 37.5 MG PO CP24
37.5000 mg | ORAL_CAPSULE | Freq: Every day | ORAL | 3 refills | Status: DC
Start: 2023-08-14 — End: 2024-08-26

## 2023-08-14 MED ORDER — NAPROXEN 500 MG PO TABS: 500.0000 mg | ORAL_TABLET | Freq: Two times a day (BID) | ORAL | 0 refills | Status: DC

## 2023-08-14 MED ORDER — PANTOPRAZOLE SODIUM 20 MG PO TBEC
20.0000 mg | DELAYED_RELEASE_TABLET | Freq: Every day | ORAL | 1 refills | Status: DC
Start: 2023-08-14 — End: 2024-06-15

## 2023-08-14 NOTE — Progress Notes (Signed)
Complete physical exam  Patient: Madeline Hawkins   DOB: 03-03-1966   57 y.o. Female  MRN: [id number] Visit Date: 08/14/2023  Subjective:    Chief Complaint  Patient presents with   Annual Exam   Madeline Hawkins is a 57 y.o. female who presents today for a complete physical exam. She reports consuming a low fat diet.  No exercise  She generally feels well. She reports sleeping well. She does have additional problems to discuss today.  Vision:No Dental:Yes STD Screen:No  BP Readings from Last 3 Encounters:  08/14/23 120/74  05/21/23 (!) 133/94  03/13/23 130/80   Wt Readings from Last 3 Encounters:  08/14/23 196 lb 9.6 oz (89.2 kg)  05/21/23 200 lb (90.7 kg)  03/13/23 200 lb 12.8 oz (91.1 kg)    Most recent fall risk assessment:    08/14/2023    8:17 AM  Fall Risk   Falls in the past year? 0  Number falls in past yr: 0  Injury with Fall? 0  Risk for fall due to : No Fall Risks  Follow up Falls prevention discussed     Depression screen:Yes - No Depression Most recent depression screenings:    08/14/2023    8:18 AM 11/07/2022    8:35 AM  PHQ 2/9 Scores  PHQ - 2 Score 0 0  PHQ- 9 Score 4 0   Shoulder Pain  The pain is present in the left shoulder and neck. This is a new problem. The current episode started in the past 7 days. There has been no history of extremity trauma. The problem occurs constantly. The problem has been unchanged. The quality of the pain is described as aching. The pain is moderate. Associated symptoms include stiffness. Pertinent negatives include no fever, inability to bear weight, itching, joint locking, joint swelling, limited range of motion, numbness or tingling. Associated symptoms comments: Pain radiates down left upper arm. The symptoms are aggravated by lying down and activity. She has tried acetaminophen for the symptoms. The treatment provided no relief. Family history does not include gout or rheumatoid arthritis. There is no history of  diabetes, gout, osteoarthritis or rheumatoid arthritis.   Left hand dorminant, current job requires repetitive lifting/pushing /pulling.  HTN (hypertension) Bp at goal with HCTZ  BP Readings from Last 3 Encounters:  08/14/23 120/74  05/21/23 (!) 133/94  03/13/23 130/80   Repeat BMP today Maintain med dose  Hyperlipidemia No adverse effects with crestor Repeat lipid panel  Hyperglycemia Repeat hgbA1c She has made dietary modifications (low carb and low sugar), but no regular exercise regimen  Past Medical History:  Diagnosis Date   Adjustment disorder with depressed mood 08/05/2015   Anxiety    Bartholin gland cyst    CIN I (cervical intraepithelial neoplasia I) 1989   cryo...also in 2008 .. had cryo    GERD (gastroesophageal reflux disease)    Hyperlipidemia    Hypertension    Intertrigo 04/13/2021   Lower abdominal pain 12/19/2022   Past Surgical History:  Procedure Laterality Date   ABDOMINAL HYSTERECTOMY  11/22/2011   Procedure: HYSTERECTOMY ABDOMINAL;  Surgeon: Ok Edwards, MD;  Location: [facility];  Service: Gynecology;  Laterality: N/A;   BARTHOLIN CYST MARSUPIALIZATION Left 12/02/2018   Procedure: BARTHOLIN MARSUPIALIZATION OF LEFT GLAND;  Surgeon: Genia Del, MD;  Location: [facility];  Service: Gynecology;  Laterality: Left;   colon polyps removed     GYNECOLOGIC CRYOSURGERY  07/07/2007   LAPAROSCOPIC LYSIS OF ADHESIONS  06/28/2015   Procedure: LAPAROSCOPIC LYSIS OF ABDOMINAL PELVIC ADHESIONS;  Surgeon: Ok Edwards, MD;  Location: [facility];  Service: Gynecology;;   LAPAROSCOPIC UNILATERAL SALPINGO OOPHERECTOMY Right 06/28/2015   Procedure: LAPAROSCOPIC RIGHT SALPINGO OOPHORECTOMY;  Surgeon: Ok Edwards, MD;  Location: [facility];  Service: Gynecology;  Laterality: Right;   LAPAROSCOPIC UNILATERAL SALPINGO OOPHERECTOMY Left 10/11/2016   Procedure: LAPAROSCOPIC UNILATERAL OOPHORECTOMY with pelvic washings;  Surgeon: Ok Edwards, MD;   Location: [facility];  Service: Gynecology;  Laterality: Left;  HARMONIC SCAPEL   LAPAROSCOPY  11/22/2011   Procedure: LAPAROSCOPY DIAGNOSTIC;  Surgeon: Ok Edwards, MD;  Location: [facility];  Service: Gynecology;  Laterality: N/A;   OVARIAN CYST REMOVAL  11/22/2011   Procedure: OVARIAN CYSTECTOMY;  Surgeon: Ok Edwards, MD;  Location: [facility];  Service: Gynecology;  Laterality: Left;   SALPINGECTOMY     left for ectopic pregnancy   TUBAL LIGATION     right tubal ligation   Social History   Socioeconomic History   Marital status: Married    Spouse name: Not on file   Number of children: Not on file   Years of education: Not on file   Highest education level: Not on file  Occupational History   Not on file  Tobacco Use   Smoking status: Never   Smokeless tobacco: Never  Vaping Use   Vaping status: Never Used  Substance and Sexual Activity   Alcohol use: No    Alcohol/week: 0.0 standard drinks of alcohol   Drug use: No   Sexual activity: Yes    Partners: Male    Birth control/protection: Surgical    Comment: 1st intercourse- 19, partners- 4, married- 12 yrs   Other Topics Concern   Not on file  Social History Narrative   Not on file   Social Determinants of Health   Financial Resource Strain: Not on file  Food Insecurity: Not on file  Transportation Needs: Not on file  Physical Activity: Not on file  Stress: Not on file  Social Connections: Not on file  Intimate Partner Violence: Not on file   Family Status  Relation Name Status   Brother  (Not Specified)   Mother  (Not Specified)   Emelda Brothers  (Not Specified)   MGM  (Not Specified)   Mat Aunt  (Not Specified)  No partnership data on file   Family History  Problem Relation Age of Onset   Diabetes Brother    Ovarian cancer Mother    Ovarian cancer Paternal Aunt    Heart disease Maternal Grandmother    Breast cancer Maternal Grandmother        Age 35's   Breast cancer Maternal Aunt        Age 65's   No  Known Allergies  Patient Care Team: Macallan Ord, Bonna Gains, NP as PCP - General (Internal Medicine) Ok Edwards, MD (Inactive) as Consulting Physician (Gynecology) Genelle Gather, OD as Consulting Physician (Optometry) Charna Elizabeth, MD as Consulting Physician (Gastroenterology)   Medications: Outpatient Medications Prior to Visit  Medication Sig   fluticasone (FLONASE) 50 MCG/ACT nasal spray Place 2 sprays into both nostrils daily.   hydrochlorothiazide (HYDRODIURIL) 12.5 MG tablet TAKE 1 TABLET BY MOUTH EVERY DAY   KLOR-CON M20 20 MEQ tablet TAKE 1 TABLET BY MOUTH EVERY DAY   Multiple Vitamins-Minerals (WOMENS MULTIVITAMIN PLUS) TABS Take by mouth daily.   ondansetron (ZOFRAN-ODT) 4 MG disintegrating tablet Take 1 tablet (4 mg total) by mouth  every 8 (eight) hours as needed for nausea or vomiting.   rosuvastatin (CRESTOR) 20 MG tablet TAKE 1 TABLET BY MOUTH EVERY DAY   [DISCONTINUED] ibuprofen (ADVIL) 600 MG tablet Take 1 tablet (600 mg total) by mouth every 8 (eight) hours as needed.   [DISCONTINUED] pantoprazole (PROTONIX) 20 MG tablet TAKE 1 TABLET BY MOUTH EVERY DAY   [DISCONTINUED] venlafaxine XR (EFFEXOR XR) 37.5 MG 24 hr capsule Take 1 capsule (37.5 mg total) by mouth daily with breakfast.   [DISCONTINUED] neomycin-polymyxin-gramicidin (NEOSPORIN) 1.75-10000-.025 ophthalmic solution Place 2 drops into the right eye 4 (four) times daily. X 10days (Patient not taking: Reported on 08/14/2023)   [DISCONTINUED] nitrofurantoin, macrocrystal-monohydrate, (MACROBID) 100 MG capsule Take 1 capsule (100 mg total) by mouth 2 (two) times daily. (Patient not taking: Reported on 03/13/2023)   [DISCONTINUED] phenazopyridine (PYRIDIUM) 100 MG tablet Take 1 tablet (100 mg total) by mouth 3 (three) times daily as needed for pain. (Patient not taking: Reported on 03/13/2023)   No facility-administered medications prior to visit.    Review of Systems  Constitutional:  Negative for activity change,  appetite change, fever and unexpected weight change.  Respiratory: Negative.    Cardiovascular: Negative.   Gastrointestinal: Negative.   Endocrine: Negative for cold intolerance and heat intolerance.  Genitourinary: Negative.   Musculoskeletal:  Positive for arthralgias, neck pain and stiffness. Negative for gout.  Skin: Negative.  Negative for itching.  Neurological: Negative.  Negative for tingling and numbness.  Hematological: Negative.   Psychiatric/Behavioral:  Negative for behavioral problems, decreased concentration, dysphoric mood, hallucinations, self-injury, sleep disturbance and suicidal ideas. The patient is not nervous/anxious.         Objective:  BP 120/74   Pulse 81   Temp 98 F (36.7 C) (Temporal)   Ht 5\' 3"  (1.6 m)   Wt 196 lb 9.6 oz (89.2 kg)   LMP 11/10/2011   SpO2 98%   BMI 34.83 kg/m     Physical Exam Vitals and nursing note reviewed.  Constitutional:      General: She is not in acute distress. HENT:     Right Ear: Tympanic membrane, ear canal and external ear normal.     Left Ear: Tympanic membrane, ear canal and external ear normal.     Nose: Nose normal.  Eyes:     Extraocular Movements: Extraocular movements intact.     Conjunctiva/sclera: Conjunctivae normal.     Pupils: Pupils are equal, round, and reactive to light.  Neck:     Thyroid: No thyroid mass, thyromegaly or thyroid tenderness.     Trachea: Trachea and phonation normal.  Cardiovascular:     Rate and Rhythm: Normal rate and regular rhythm.     Pulses: Normal pulses.     Heart sounds: Normal heart sounds.  Pulmonary:     Effort: Pulmonary effort is normal.     Breath sounds: Normal breath sounds.  Abdominal:     General: Bowel sounds are normal.     Palpations: Abdomen is soft.  Musculoskeletal:        General: Normal range of motion.     Right shoulder: Normal.     Left shoulder: Tenderness present. No effusion, bony tenderness or crepitus. Normal range of motion. Normal  strength. Normal pulse.     Right upper arm: Normal.     Left upper arm: Normal.     Right elbow: Normal.     Left elbow: Normal.     Right forearm: Normal.  Left forearm: Normal.     Right wrist: Normal.     Left wrist: Normal.     Right hand: Normal.     Left hand: Normal.     Cervical back: Normal range of motion and neck supple. No torticollis or crepitus. Pain with movement and muscular tenderness present. No spinous process tenderness. Normal range of motion.     Thoracic back: Normal.     Right lower leg: No edema.     Left lower leg: No edema.  Lymphadenopathy:     Cervical: No cervical adenopathy.  Skin:    General: Skin is warm and dry.  Neurological:     Mental Status: She is alert and oriented to person, place, and time.     Cranial Nerves: No cranial nerve deficit.  Psychiatric:        Mood and Affect: Mood normal.        Behavior: Behavior normal.        Thought Content: Thought content normal.     No results found for any visits on 08/14/23.    Assessment & Plan:    Routine Health Maintenance and Physical Exam  Immunization History  Administered Date(s) Administered   Influenza,inj,Quad PF,6+ Mos 11/14/2012, 07/22/2017, 07/29/2018, 08/26/2019   Tdap 03/15/2014   Health Maintenance  Topic Date Due   COVID-19 Vaccine (1 - 2023-24 season) 08/30/2023 (Originally 06/30/2023)   Zoster Vaccines- Shingrix (1 of 2) 11/14/2023 (Originally 07/01/2018)   INFLUENZA VACCINE  01/27/2024 (Originally 05/30/2023)   Hepatitis C Screening  08/13/2024 (Originally 07/01/1986)   MAMMOGRAM  10/04/2023   DTaP/Tdap/Td (2 - Td or Tdap) 03/15/2024   Colonoscopy  04/05/2024   HIV Screening  Completed   HPV VACCINES  Aged Out   Discussed health benefits of physical activity, and encouraged her to engage in regular exercise appropriate for her age and condition.  Problem List Items Addressed This Visit     Gastroesophageal reflux disease   Relevant Medications   pantoprazole  (PROTONIX) 20 MG tablet   HTN (hypertension)    Bp at goal with HCTZ  BP Readings from Last 3 Encounters:  08/14/23 120/74  05/21/23 (!) 133/94  03/13/23 130/80   Repeat BMP today Maintain med dose      Relevant Orders   Renal Function Panel   Hyperglycemia    Repeat hgbA1c She has made dietary modifications (low carb and low sugar), but no regular exercise regimen      Relevant Orders   Hemoglobin A1c   Hyperlipidemia    No adverse effects with crestor Repeat lipid panel      Relevant Orders   Lipid panel   Vasomotor symptoms due to menopause   Relevant Medications   venlafaxine XR (EFFEXOR XR) 37.5 MG 24 hr capsule   Other Visit Diagnoses     Encounter for preventative adult health care exam with abnormal findings    -  Primary   Acute pain of left shoulder       Relevant Medications   naproxen (NAPROSYN) 500 MG tablet   methocarbamol (ROBAXIN) 500 MG tablet   Other Relevant Orders   Ambulatory referral to Physical Therapy   Neck pain on left side       Relevant Medications   naproxen (NAPROSYN) 500 MG tablet   methocarbamol (ROBAXIN) 500 MG tablet   Other Relevant Orders   Ambulatory referral to Physical Therapy   Asymptomatic postmenopausal estrogen deficiency       Relevant Orders  DG Bone Density      Return in about 6 months (around 02/12/2024) for HTN, hyperglycemia, hyperlipidemia (fasting).     [name], NP

## 2023-08-14 NOTE — Assessment & Plan Note (Signed)
Repeat hgbA1c She has made dietary modifications (low carb and low sugar), but no regular exercise regimen

## 2023-08-14 NOTE — Assessment & Plan Note (Signed)
No adverse effects with crestor ?Repeat lipid panel ?

## 2023-08-14 NOTE — Assessment & Plan Note (Signed)
Bp at goal with HCTZ  BP Readings from Last 3 Encounters:  08/14/23 120/74  05/21/23 (!) 133/94  03/13/23 130/80   Repeat BMP today Maintain med dose

## 2023-08-14 NOTE — Patient Instructions (Addendum)
Go to lab maintain Heart healthy diet and daily exercise. Maintain current medications.  Shoulder Range of Motion Exercises Shoulder range of motion (ROM) exercises are done to keep the shoulder moving freely or to increase movement. They are recommended for people who have shoulder pain or stiffness or who are recovering from a shoulder surgery. Ask your health care provider which exercises are safe for you. Do exercises exactly as told by your health care provider and adjust them as directed. It is normal to feel mild stretching, pulling, tightness, or discomfort as you do these exercises. Stop right away if you feel sudden pain or your pain gets worse. Do not begin these exercises until told by your health care provider. Phase 1 exercise When you are able, do this exercise 1-2 times a day for 30-60 seconds in each direction, or as directed by your health care provider. Pendulum exercise     To do this exercise while sitting: Sit in a chair or at the edge of your bed with your feet flat on the floor. Let your affected arm hang down in front of you over the edge of the bed or chair. Relax your shoulder, arm, and hand. Rock your body so your arm gently swings in small circles. You can also use your unaffected arm to start the motion. Repeat, changing the direction of the circles, swinging your arm left and right, and swinging your arm forward and back. To do this exercise while standing: Stand next to a sturdy chair or table, and hold on to it with your hand on your unaffected side. Bend forward at the waist. Bend your knees slightly. Relax your shoulder, arm, and hand. While keeping your shoulder relaxed, use body motion to swing your arm in small circles. Repeat, changing the direction of the circles, swinging your arm left and right, and swinging your arm forward and back. Between exercises, stand up tall and take a short break to relax your lower back.  Phase 2 exercises Do these  exercises 1-2 times a day or as told by your health care provider. Hold each stretch for 30 seconds, and repeat 3 times. Do the exercises with one or both arms as instructed by your health care provider. For these exercises, sit at a table with your hand and arm supported by the table. A chair that slides easily or has wheels can be helpful. External rotation

## 2023-08-20 ENCOUNTER — Other Ambulatory Visit: Payer: Self-pay | Admitting: Nurse Practitioner

## 2023-08-20 DIAGNOSIS — Z1231 Encounter for screening mammogram for malignant neoplasm of breast: Secondary | ICD-10-CM

## 2023-08-27 NOTE — Therapy (Incomplete)
OUTPATIENT PHYSICAL THERAPY SHOULDER EVALUATION   Patient Name: Madeline Hawkins MRN: [id number] DOB:1965-11-16, 57 y.o., female Today's Date: 08/28/2023  END OF SESSION:  PT End of Session - 08/28/23 1535     Visit Number 1    Number of Visits 12    Date for PT Re-Evaluation 10/09/23    Authorization Type [employer] MCD    Authorization Time Period requested 12 visits 10/30 to 12/25    Authorization - Visit Number 1    PT Start Time 1535    PT Stop Time 1616    PT Time Calculation (min) 41 min    Activity Tolerance Patient tolerated treatment well    Behavior During Therapy [facility] for tasks assessed/performed             Past Medical History:  Diagnosis Date   Adjustment disorder with depressed mood 08/05/2015   Anxiety    Bartholin gland cyst    CIN I (cervical intraepithelial neoplasia I) 1989   cryo...also in 2008 .. had cryo    GERD (gastroesophageal reflux disease)    Hyperlipidemia    Hypertension    Intertrigo 04/13/2021   Lower abdominal pain 12/19/2022   Past Surgical History:  Procedure Laterality Date   ABDOMINAL HYSTERECTOMY  11/22/2011   Procedure: HYSTERECTOMY ABDOMINAL;  Surgeon: Ok Edwards, MD;  Location: [facility];  Service: Gynecology;  Laterality: N/A;   BARTHOLIN CYST MARSUPIALIZATION Left 12/02/2018   Procedure: BARTHOLIN MARSUPIALIZATION OF LEFT GLAND;  Surgeon: Genia Del, MD;  Location: [facility];  Service: Gynecology;  Laterality: Left;   colon polyps removed     GYNECOLOGIC CRYOSURGERY  07/07/2007   LAPAROSCOPIC LYSIS OF ADHESIONS  06/28/2015   Procedure: LAPAROSCOPIC LYSIS OF ABDOMINAL PELVIC ADHESIONS;  Surgeon: Ok Edwards, MD;  Location: [facility];  Service: Gynecology;;   LAPAROSCOPIC UNILATERAL SALPINGO OOPHERECTOMY Right 06/28/2015   Procedure: LAPAROSCOPIC RIGHT SALPINGO OOPHORECTOMY;  Surgeon: Ok Edwards, MD;  Location: [facility];  Service: Gynecology;  Laterality: Right;   LAPAROSCOPIC UNILATERAL SALPINGO  OOPHERECTOMY Left 10/11/2016   Procedure: LAPAROSCOPIC UNILATERAL OOPHORECTOMY with pelvic washings;  Surgeon: Ok Edwards, MD;  Location: [facility];  Service: Gynecology;  Laterality: Left;  HARMONIC SCAPEL   LAPAROSCOPY  11/22/2011   Procedure: LAPAROSCOPY DIAGNOSTIC;  Surgeon: Ok Edwards, MD;  Location: [facility];  Service: Gynecology;  Laterality: N/A;   OVARIAN CYST REMOVAL  11/22/2011   Procedure: OVARIAN CYSTECTOMY;  Surgeon: Ok Edwards, MD;  Location: [facility];  Service: Gynecology;  Laterality: Left;   SALPINGECTOMY     left for ectopic pregnancy   TUBAL LIGATION     right tubal ligation   Patient Active Problem List   Diagnosis Date Noted   Family history of colon cancer 02/07/2022   Chronic idiopathic constipation 04/13/2021   Family history of malignant neoplasm of gastrointestinal tract 04/13/2021   Family history of cervical cancer 04/13/2021   Hyperglycemia 04/13/2021   Vasomotor symptoms due to menopause 04/13/2021   Gastroesophageal reflux disease 02/01/2016   Hyperlipidemia 11/14/2012   HTN (hypertension) 02/04/2012    PCP: Anne Ng, NP   REFERRING PROVIDER: Anne Ng, NP   REFERRING DIAG:  M25.512 (ICD-10-CM) - Acute pain of left shoulder  M54.2 (ICD-10-CM) - Neck pain on left side    THERAPY DIAG:  Acute pain of left shoulder  Stiffness of left shoulder, not elsewhere classified  Cervicalgia  Cramp and spasm  Rationale for Evaluation and Treatment: Rehabilitation  ONSET DATE: two weeks ago  SUBJECTIVE:                                                                                                                                                                                      SUBJECTIVE STATEMENT: No MOI. When I would turn my head a certain way I would feel pain up into my head. Pain is not as bad but I still feel it. I use my left hand a lot so it sends a lot of pain.Hard to get in a position to sleep or if I  awake it's hard. Throwing trash for work.  Hand dominance: Left  PERTINENT HISTORY: HTN  PAIN:  Are you having pain? Yes: NPRS scale: 7-8/10 Pain location: L UT Pain description: sharp Aggravating factors: turning head, all motions of the shoulder Relieving factors: muscle relaxor  PRECAUTIONS: None  RED FLAGS: None   WEIGHT BEARING RESTRICTIONS: No  FALLS:  Has patient fallen in last 6 months? 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  DIAGNOSTIC FINDINGS:  none  PATIENT SURVEYS:  Quick Dash 65.9 / 100 = 65.9 %  COGNITION: Overall cognitive status: Within functional limits for tasks assessed     SENSATION: Feels numbness into L arm to tips of fingers after a while when it's really hurting.  POSTURE: Rounded shoulders  CERVICAL ROM: * pain  Active ROM A/PROM (deg) eval  Flexion full  Extension Full *  Right lateral flexion Full * Left  Left lateral flexion Full * Left  Right rotation 75%*  Left rotation 75%*   (Blank rows = not tested)   UPPER EXTREMITY ROM:   A/P ROM Right eval Left eval  Shoulder flexion  125/140*  Shoulder extension    Shoulder abduction  125/142  Shoulder adduction    Shoulder internal rotation  52/65  Shoulder external rotation  54/70  Elbow flexion    Elbow extension    Wrist flexion    Wrist extension    Wrist ulnar deviation    Wrist radial deviation    Wrist pronation    Wrist supination    (Blank rows = not tested)  UPPER EXTREMITY MMT:  MMT Right eval Left eval  Shoulder flexion  4+*  Shoulder extension  4+  Shoulder abduction  4*  Shoulder adduction    Shoulder internal rotation  4-  Shoulder external rotation  4+  Middle trapezius     Lower trapezius    Elbow flexion  4+*  Elbow extension  5  Wrist flexion    Wrist extension    Wrist ulnar deviation    Wrist radial deviation    Wrist pronation    Wrist supination  5  Grip strength (lbs)    (Blank rows = not tested)  SHOULDER SPECIAL TESTS: Impingement tests: Neer impingement test: positive  and Hawkins/Kennedy impingement test: positive  Instability tests: Apprehension test: negative Rotator cuff assessment: Empty can test: positive , Full can test: negative, Gerber lift off test: negative, and Belly press test: negative Biceps assessment: Yergason's test: positive  ULTT - negative ulnar, median and radial  JOINT MOBILITY TESTING:  WNL no pain  PALPATION:  Subscap origin and muscle belly, biceps LH tendon, UT, supraspinatus   TODAY'S TREATMENT:                                                                                                                                         DATE:  08/28/23 See pt ed and HEP   PATIENT EDUCATION: Education details: PT eval findings, anticipated POC, initial HEP, postural awareness, role of DN, Ionto patch wearing instructions, and discussed shoulder anatomy and impingement syndrome.  Person educated: Patient Education method: Explanation, Demonstration, and Handouts Education comprehension: verbalized understanding and returned demonstration  HOME EXERCISE PROGRAM: Access Code: [email or web address] URL: [email or web address] Date: 08/28/2023 Prepared by: Raynelle Fanning  Exercises - Sternocleidomastoid Stretch (Mirrored)  - 2 x daily - 7 x weekly - 1 sets - 3 reps - 30 hold - Isometric Shoulder Flexion at Wall  - 1 x daily - 7 x weekly - 1 sets - 5 reps - 10 sec hold - Isometric Shoulder Abduction - Arm Straight at Wall  - 1 x daily - 7 x weekly - 1 sets - 5 reps - 10 sec  hold - Isometric Shoulder Extension at Wall  - 1 x daily - 7 x weekly - 1 sets - 5 reps - 10 sec hold - Standing Isometric Shoulder Internal Rotation  at Doorway (Mirrored)  - 1 x daily - 7 x weekly - 1 sets - 5 reps - 10 sec hold - Isometric Shoulder External Rotation at Wall  - 1 x daily - 7 x weekly - 1 sets - 5 reps - 10 sec hold  Patient education:  - Trigger point dry needling -  Iontophoresis   ASSESSMENT:  CLINICAL IMPRESSION: Patient is a 57 y.o. female who was seen today for physical therapy evaluation and treatment for L neck and shoulder pain x 2 months of unknown MOI. 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 She has deficits in neck rotation and all planes of L shoulder ROM and has mild to moderate weakness in the left shoulder. Signs and symptoms are consistent  with impingement syndrome. She will benefit from skilled PT to address these deficits.    OBJECTIVE IMPAIRMENTS: decreased activity tolerance, decreased ROM, decreased strength, increased edema, increased muscle spasms, impaired flexibility, impaired UE functional use, postural dysfunction, and pain.   ACTIVITY LIMITATIONS: carrying, lifting, sleeping, bathing, toileting, dressing, reach over head, and hygiene/grooming  PARTICIPATION LIMITATIONS: cleaning and occupation  PERSONAL FACTORS: Profession are also affecting patient's functional outcome.   REHAB POTENTIAL: Excellent  CLINICAL DECISION MAKING: Evolving/moderate complexity  EVALUATION COMPLEXITY: Low   GOALS: Goals reviewed with patient? Yes  SHORT TERM GOALS: Target date: 09/25/2023   Patient will be independent with initial HEP.  Baseline: no HEP Goal status: INITIAL  2.  Decreased neck and shoulder pain by 50%  Baseline: 8/10 Goal status: INITIAL  3.  No UE numbness and tingling Baseline: intermittent N/T L arm into fingers Goal status: INITIAL   LONG TERM GOALS: Target date: 10/23/2023   Patient will be independent with advanced/ongoing HEP to improve outcomes and carryover.  Baseline: initial HEP Goal status: INITIAL  2.   Patient will report 85% improvement in neck and L shoulder pain to improve QOL.  Baseline: 8/10 Goal status: INITIAL  3.  Improved shoulder strength to 5/5  Baseline: see objective chart Goal status: INITIAL  4.  Patient to improve L shoulder and neck AROM to WNL without pain provocation to allow for increased ease of ADLs.  Baseline: see objective chart Goal status: INITIAL  5.  Patient will report 39 on Quick Dash to demonstrate improved functional ability.  Baseline: 65 / 100 = 65.9 % Goal status: INITIAL  6.  Patient will be able to sleep without L shoulder pain.   Baseline: hard to get to sleep due to pain Goal status: INITIAL     PLAN:  PT FREQUENCY: 2x/week  PT DURATION:  12 sessions  PLANNED INTERVENTIONS: 97110-Therapeutic exercises, 97530- Therapeutic activity, 97112- Neuromuscular re-education, 97535- Self Care, 11914- Manual therapy, 97014- Electrical stimulation (unattended), 97035- Ultrasound, [id number]- Ionotophoresis 4mg /ml Dexamethasone, Patient/Family education, Taping, Dry Needling, Joint mobilization, Spinal mobilization, Cryotherapy, and Moist heat  PLAN FOR NEXT SESSION: Review and progress HEP, work on decreasing pain and inflammation then progress ROM and strengthening, manual/DN to L upper quadrant, trial of ionto.   Solon Palm, PT  08/28/2023, 4:48 PM

## 2023-08-28 ENCOUNTER — Encounter: Payer: Self-pay | Admitting: Physical Therapy

## 2023-08-28 ENCOUNTER — Ambulatory Visit: Payer: BC Managed Care – PPO | Attending: Nurse Practitioner | Admitting: Physical Therapy

## 2023-08-28 ENCOUNTER — Other Ambulatory Visit: Payer: Self-pay

## 2023-08-28 DIAGNOSIS — R252 Cramp and spasm: Secondary | ICD-10-CM | POA: Diagnosis present

## 2023-08-28 DIAGNOSIS — M542 Cervicalgia: Secondary | ICD-10-CM | POA: Insufficient documentation

## 2023-08-28 DIAGNOSIS — M25512 Pain in left shoulder: Secondary | ICD-10-CM | POA: Insufficient documentation

## 2023-08-28 DIAGNOSIS — M25612 Stiffness of left shoulder, not elsewhere classified: Secondary | ICD-10-CM | POA: Insufficient documentation

## 2023-09-17 ENCOUNTER — Ambulatory Visit: Payer: BC Managed Care – PPO | Attending: Nurse Practitioner | Admitting: Physical Therapy

## 2023-09-17 ENCOUNTER — Encounter: Payer: Self-pay | Admitting: Physical Therapy

## 2023-09-17 DIAGNOSIS — M542 Cervicalgia: Secondary | ICD-10-CM

## 2023-09-17 DIAGNOSIS — M25512 Pain in left shoulder: Secondary | ICD-10-CM

## 2023-09-17 DIAGNOSIS — M25612 Stiffness of left shoulder, not elsewhere classified: Secondary | ICD-10-CM | POA: Diagnosis present

## 2023-09-17 DIAGNOSIS — R252 Cramp and spasm: Secondary | ICD-10-CM | POA: Diagnosis present

## 2023-09-17 NOTE — Therapy (Signed)
OUTPATIENT PHYSICAL THERAPY SHOULDER TREATMENT   Patient Name: Madeline Hawkins MRN: [id number] DOB:1966-02-11, 57 y.o., female Today's Date: 09/17/2023  END OF SESSION:  PT End of Session - 09/17/23 1446     Visit Number 2    Number of Visits 12    Date for PT Re-Evaluation 10/09/23    Authorization Type BCBS MCD    Authorization Time Period requested 12 visits 10/30 to 12/25    PT Start Time 1446    PT Stop Time 1533    PT Time Calculation (min) 47 min    Activity Tolerance Patient tolerated treatment well    Behavior During Therapy [facility] for tasks assessed/performed             Past Medical History:  Diagnosis Date   Adjustment disorder with depressed mood 08/05/2015   Anxiety    Bartholin gland cyst    CIN I (cervical intraepithelial neoplasia I) 1989   cryo...also in 2008 .. had cryo    GERD (gastroesophageal reflux disease)    Hyperlipidemia    Hypertension    Intertrigo 04/13/2021   Lower abdominal pain 12/19/2022   Past Surgical History:  Procedure Laterality Date   ABDOMINAL HYSTERECTOMY  11/22/2011   Procedure: HYSTERECTOMY ABDOMINAL;  Surgeon: Ok Edwards, MD;  Location: [facility];  Service: Gynecology;  Laterality: N/A;   BARTHOLIN CYST MARSUPIALIZATION Left 12/02/2018   Procedure: BARTHOLIN MARSUPIALIZATION OF LEFT GLAND;  Surgeon: Genia Del, MD;  Location: [facility];  Service: Gynecology;  Laterality: Left;   colon polyps removed     GYNECOLOGIC CRYOSURGERY  07/07/2007   LAPAROSCOPIC LYSIS OF ADHESIONS  06/28/2015   Procedure: LAPAROSCOPIC LYSIS OF ABDOMINAL PELVIC ADHESIONS;  Surgeon: Ok Edwards, MD;  Location: [facility];  Service: Gynecology;;   LAPAROSCOPIC UNILATERAL SALPINGO OOPHERECTOMY Right 06/28/2015   Procedure: LAPAROSCOPIC RIGHT SALPINGO OOPHORECTOMY;  Surgeon: Ok Edwards, MD;  Location: [facility];  Service: Gynecology;  Laterality: Right;   LAPAROSCOPIC UNILATERAL SALPINGO OOPHERECTOMY Left 10/11/2016    Procedure: LAPAROSCOPIC UNILATERAL OOPHORECTOMY with pelvic washings;  Surgeon: Ok Edwards, MD;  Location: [facility];  Service: Gynecology;  Laterality: Left;  HARMONIC SCAPEL   LAPAROSCOPY  11/22/2011   Procedure: LAPAROSCOPY DIAGNOSTIC;  Surgeon: Ok Edwards, MD;  Location: [facility];  Service: Gynecology;  Laterality: N/A;   OVARIAN CYST REMOVAL  11/22/2011   Procedure: OVARIAN CYSTECTOMY;  Surgeon: Ok Edwards, MD;  Location: [facility];  Service: Gynecology;  Laterality: Left;   SALPINGECTOMY     left for ectopic pregnancy   TUBAL LIGATION     right tubal ligation   Patient Active Problem List   Diagnosis Date Noted   Family history of colon cancer 02/07/2022   Chronic idiopathic constipation 04/13/2021   Family history of malignant neoplasm of gastrointestinal tract 04/13/2021   Family history of cervical cancer 04/13/2021   Hyperglycemia 04/13/2021   Vasomotor symptoms due to menopause 04/13/2021   Gastroesophageal reflux disease 02/01/2016   Hyperlipidemia 11/14/2012   HTN (hypertension) 02/04/2012    PCP: Anne Ng, NP   REFERRING PROVIDER: Anne Ng, NP   REFERRING DIAG:  M25.512 (ICD-10-CM) - Acute pain of left shoulder  M54.2 (ICD-10-CM) - Neck pain on left side    THERAPY DIAG:  Acute pain of left shoulder  Stiffness of left shoulder, not elsewhere classified  Cervicalgia  Cramp and spasm  Rationale for Evaluation and Treatment: Rehabilitation  ONSET DATE: two weeks ago  SUBJECTIVE:  SUBJECTIVE STATEMENT: Stretches are helping a lot.  Neck and shoulder pain are better.   Hand dominance: Left  PERTINENT HISTORY: HTN  PAIN:  Are you having pain? Yes: NPRS scale: 2/10 Pain location: L UT Pain description: sharp Aggravating factors: turning head,  all motions of the shoulder Relieving factors: muscle relaxor  PRECAUTIONS: None  RED FLAGS: None   WEIGHT BEARING RESTRICTIONS: No  FALLS:  Has patient fallen in last 6 months? 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  DIAGNOSTIC FINDINGS:  none  PATIENT SURVEYS:  Quick Dash 65.9 / 100 = 65.9 %  COGNITION: Overall cognitive status: Within functional limits for tasks assessed     SENSATION: Feels numbness into L arm to tips of fingers after a while when it's really hurting.  POSTURE: Rounded shoulders  CERVICAL ROM: * pain  Active ROM A/PROM (deg) eval  Flexion full  Extension Full *  Right lateral flexion Full * Left  Left lateral flexion Full * Left  Right rotation 75%*  Left rotation 75%*   (Blank rows = not tested)   UPPER EXTREMITY ROM:   A/P ROM Right eval Left eval  Shoulder flexion  125/140*  Shoulder extension    Shoulder abduction  125/142  Shoulder adduction    Shoulder internal rotation  52/65  Shoulder external rotation  54/70  Elbow flexion    Elbow extension    Wrist flexion    Wrist extension    Wrist ulnar deviation    Wrist radial deviation    Wrist pronation    Wrist supination    (Blank rows = not tested)  UPPER EXTREMITY MMT:  MMT Right eval Left eval  Shoulder flexion  4+*  Shoulder extension  4+  Shoulder abduction  4*  Shoulder adduction    Shoulder internal rotation  4-  Shoulder external rotation  4+  Middle trapezius    Lower trapezius    Elbow flexion  4+*  Elbow extension  5  Wrist flexion    Wrist extension    Wrist ulnar deviation    Wrist radial deviation    Wrist pronation    Wrist supination  5  Grip strength (lbs)    (Blank  rows = not tested)  SHOULDER SPECIAL TESTS: Impingement tests: Neer impingement test: positive  and Hawkins/Kennedy impingement test: positive  Instability tests: Apprehension test: negative Rotator cuff assessment: Empty can test: positive , Full can test: negative, Gerber lift off test: negative, and Belly press test: negative Biceps assessment: Yergason's test: positive  ULTT - negative ulnar, median and radial  JOINT MOBILITY TESTING:  WNL no pain  PALPATION:  Subscap origin and muscle belly, biceps LH tendon, UT, supraspinatus   TODAY'S TREATMENT:  DATE:   09/17/23 Therapeutic Exercise: to improve strength and mobility.  Demo, verbal and tactile cues throughout for technique. UBE L1 x 4 min forward Review of current HEP - see below Manual Therapy: to decrease muscle spasm and pain and improve mobility STM/TPR to cervical paraspinals, NAGs into rotation, PA mobs, L levator, L UT, skilled palpation and monitoring during dry needling. Trigger Point Dry-Needling  Treatment instructions: Expect mild to moderate muscle soreness. S/S of pneumothorax if dry needled over a lung field, and to seek immediate medical attention should they occur. Patient verbalized understanding of these instructions and education. Patient Consent Given: Yes Education handout provided: Yes Muscles treated: L UT, L Levator, bil cervical multifidi C4-6 Electrical stimulation performed: No Parameters: N/A Treatment response/outcome: Twitch Response Elicited and Palpable Increase in Muscle Length Ultrasound: x 8 min to L supraspinatus 1 MHz, 1.2 w/cm2 cont to decrease inflammation/pain  08/28/23 See pt ed and HEP   PATIENT EDUCATION: Education details: HEP review, TrDN  Person educated: Patient Education method: Explanation, Demonstration, and Handouts Education  comprehension: verbalized understanding and returned demonstration  HOME EXERCISE PROGRAM: Access Code: [email or web address] URL: [email or web address] Date: 08/28/2023 Prepared by: Raynelle Fanning  Exercises - Sternocleidomastoid Stretch (Mirrored)  - 2 x daily - 7 x weekly - 1 sets - 3 reps - 30 hold - Isometric Shoulder Flexion at Wall  - 1 x daily - 7 x weekly - 1 sets - 5 reps - 10 sec hold - Isometric Shoulder Abduction - Arm Straight at Wall  - 1 x daily - 7 x weekly - 1 sets - 5 reps - 10 sec  hold - Isometric Shoulder Extension at Wall  - 1 x daily - 7 x weekly - 1 sets - 5 reps - 10 sec hold - Standing Isometric Shoulder Internal Rotation at Doorway (Mirrored)  - 1 x daily - 7 x weekly - 1 sets - 5 reps - 10 sec hold - Isometric Shoulder External Rotation at Wall  - 1 x daily - 7 x weekly - 1 sets - 5 reps - 10 sec hold  Patient education:  - Trigger point dry needling -  Iontophoresis   ASSESSMENT:  CLINICAL IMPRESSION: Madeline Hawkins reports significant improvement in neck and L shoulder pain with stretches, met STG #1.  She still demonstrated significant irritation in L supra and infraspinatus, so did not advance HEP today, to continue with isometrics.  Today focused on manual therapy to neck and L UT/LS.  After explanation of DN rational, procedures, outcomes and potential side effects, patient verbalized consent to DN treatment in conjunction with manual STM/DTM and TPR to reduce ttp/muscle tension. Muscles treated as indicated above. DN produced normal response with good twitches elicited resulting in palpable reduction in pain/ttp and muscle tension, with patient noting less pain upon initiation of movement following DN. Pt educated to expect mild to moderate muscle soreness for up to 24-48 hrs and instructed to continue prescribed home exercise program and current activity level with pt verbalizing understanding of theses instructions.  Also applied [place] to L supraspinatus to  decrease inflammation.  Madeline Hawkins continues to demonstrate potential for improvement and would benefit from continued skilled therapy to address impairments.      OBJECTIVE IMPAIRMENTS: decreased activity tolerance, decreased ROM, decreased strength, increased edema, increased muscle spasms, impaired flexibility, impaired UE functional use, postural dysfunction, and pain.   ACTIVITY LIMITATIONS: carrying, lifting, sleeping, bathing, toileting, dressing, reach over head, and hygiene/grooming  PARTICIPATION LIMITATIONS: cleaning and  occupation  PERSONAL FACTORS: Profession are also affecting patient's functional outcome.   REHAB POTENTIAL: Excellent  CLINICAL DECISION MAKING: Evolving/moderate complexity  EVALUATION COMPLEXITY: Low   GOALS: Goals reviewed with patient? Yes  SHORT TERM GOALS: Target date: 09/25/2023   Patient will be independent with initial HEP.  Baseline: no HEP Goal status: MET 09/17/23  2.  Decreased neck and shoulder pain by 50%  Baseline: 8/10 Goal status: IN PROGRESS 09/17/2023- 2/10 pain  3.  No UE numbness and tingling Baseline: intermittent N/T L arm into fingers Goal status: IN PROGRESS 09/17/23- no report of tingling today   LONG TERM GOALS: Target date: 10/23/2023   Patient will be independent with advanced/ongoing HEP to improve outcomes and carryover.  Baseline: initial HEP Goal status: IN PROGRESS  2.  Patient will report 85% improvement in neck and L shoulder pain to improve QOL.  Baseline: 8/10 Goal status: IN PROGRESS  3.  Improved shoulder strength to 5/5  Baseline: see objective chart Goal status: IN PROGRESS  4.  Patient to improve L shoulder and neck AROM to WNL without pain provocation to allow for increased ease of ADLs.  Baseline: see objective chart Goal status: IN PROGRESS  5.  Patient will report 32 on Quick Dash to demonstrate improved functional ability.  Baseline: 65 / 100 = 65.9 % Goal status: IN  PROGRESS  6.  Patient will be able to sleep without L shoulder pain.   Baseline: hard to get to sleep due to pain Goal status: IN PROGRESS     PLAN:  PT FREQUENCY: 2x/week  PT DURATION:  12 sessions  PLANNED INTERVENTIONS: 97110-Therapeutic exercises, 97530- Therapeutic activity, 97112- Neuromuscular re-education, 97535- Self Care, 40981- Manual therapy, 97014- Electrical stimulation (unattended), 97035- Ultrasound, 19147- Ionotophoresis 4mg /ml Dexamethasone, Patient/Family education, Taping, Dry Needling, Joint mobilization, Spinal mobilization, Cryotherapy, and Moist heat  PLAN FOR NEXT SESSION: Review and progress HEP, work on decreasing pain and inflammation then progress ROM and strengthening, manual/DN to L upper quadrant, trial of ionto.   Jena Gauss, PT  09/17/2023, 5:36 PM

## 2023-09-17 NOTE — Patient Instructions (Signed)

## 2023-09-19 ENCOUNTER — Ambulatory Visit: Payer: BC Managed Care – PPO

## 2023-09-19 ENCOUNTER — Other Ambulatory Visit: Payer: Self-pay

## 2023-09-19 DIAGNOSIS — M25512 Pain in left shoulder: Secondary | ICD-10-CM | POA: Diagnosis not present

## 2023-09-19 DIAGNOSIS — M542 Cervicalgia: Secondary | ICD-10-CM

## 2023-09-19 DIAGNOSIS — R252 Cramp and spasm: Secondary | ICD-10-CM

## 2023-09-19 DIAGNOSIS — M25612 Stiffness of left shoulder, not elsewhere classified: Secondary | ICD-10-CM

## 2023-09-19 NOTE — Therapy (Signed)
OUTPATIENT PHYSICAL THERAPY SHOULDER TREATMENT   Patient Name: Madeline Hawkins MRN: [id number] DOB:1966/07/20, 57 y.o., female Today's Date: 09/19/2023  END OF SESSION:  PT End of Session - 09/19/23 1533     Visit Number 3    Number of Visits 12    Date for PT Re-Evaluation 10/09/23    Authorization Type BCBS MCD    PT Start Time 1533              Past Medical History:  Diagnosis Date   Adjustment disorder with depressed mood 08/05/2015   Anxiety    Bartholin gland cyst    CIN I (cervical intraepithelial neoplasia I) 1989   cryo...also in 2008 .. had cryo    GERD (gastroesophageal reflux disease)    Hyperlipidemia    Hypertension    Intertrigo 04/13/2021   Lower abdominal pain 12/19/2022   Past Surgical History:  Procedure Laterality Date   ABDOMINAL HYSTERECTOMY  11/22/2011   Procedure: HYSTERECTOMY ABDOMINAL;  Surgeon: Ok Edwards, MD;  Location: [facility];  Service: Gynecology;  Laterality: N/A;   BARTHOLIN CYST MARSUPIALIZATION Left 12/02/2018   Procedure: BARTHOLIN MARSUPIALIZATION OF LEFT GLAND;  Surgeon: Genia Del, MD;  Location: [facility];  Service: Gynecology;  Laterality: Left;   colon polyps removed     GYNECOLOGIC CRYOSURGERY  07/07/2007   LAPAROSCOPIC LYSIS OF ADHESIONS  06/28/2015   Procedure: LAPAROSCOPIC LYSIS OF ABDOMINAL PELVIC ADHESIONS;  Surgeon: Ok Edwards, MD;  Location: [facility];  Service: Gynecology;;   LAPAROSCOPIC UNILATERAL SALPINGO OOPHERECTOMY Right 06/28/2015   Procedure: LAPAROSCOPIC RIGHT SALPINGO OOPHORECTOMY;  Surgeon: Ok Edwards, MD;  Location: [facility];  Service: Gynecology;  Laterality: Right;   LAPAROSCOPIC UNILATERAL SALPINGO OOPHERECTOMY Left 10/11/2016   Procedure: LAPAROSCOPIC UNILATERAL OOPHORECTOMY with pelvic washings;  Surgeon: Ok Edwards, MD;  Location: [facility];  Service: Gynecology;  Laterality: Left;  HARMONIC SCAPEL   LAPAROSCOPY  11/22/2011   Procedure: LAPAROSCOPY DIAGNOSTIC;   Surgeon: Ok Edwards, MD;  Location: [facility];  Service: Gynecology;  Laterality: N/A;   OVARIAN CYST REMOVAL  11/22/2011   Procedure: OVARIAN CYSTECTOMY;  Surgeon: Ok Edwards, MD;  Location: [facility];  Service: Gynecology;  Laterality: Left;   SALPINGECTOMY     left for ectopic pregnancy   TUBAL LIGATION     right tubal ligation   Patient Active Problem List   Diagnosis Date Noted   Family history of colon cancer 02/07/2022   Chronic idiopathic constipation 04/13/2021   Family history of malignant neoplasm of gastrointestinal tract 04/13/2021   Family history of cervical cancer 04/13/2021   Hyperglycemia 04/13/2021   Vasomotor symptoms due to menopause 04/13/2021   Gastroesophageal reflux disease 02/01/2016   Hyperlipidemia 11/14/2012   HTN (hypertension) 02/04/2012    PCP: Anne Ng, NP   REFERRING PROVIDER: Anne Ng, NP   REFERRING DIAG:  M25.512 (ICD-10-CM) - Acute pain of left shoulder  M54.2 (ICD-10-CM) - Neck pain on left side    THERAPY DIAG:  No diagnosis found.  Rationale for Evaluation and Treatment: Rehabilitation  ONSET DATE: two weeks ago  SUBJECTIVE:  SUBJECTIVE STATEMENT: Felt much better but think I've overdone things the last couple of days.   Hand dominance: Left  PERTINENT HISTORY: HTN  PAIN:  Are you having pain? Yes: NPRS scale: 2/10 Pain location: L UT Pain description: sharp Aggravating factors: turning head, all motions of the shoulder Relieving factors: muscle relaxor  PRECAUTIONS: None  RED FLAGS: None   WEIGHT BEARING RESTRICTIONS: No  FALLS:  Has patient fallen in last 6 months? 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  DIAGNOSTIC FINDINGS:  none  PATIENT SURVEYS:  Quick Dash 65.9 / 100 = 65.9 %  COGNITION: Overall cognitive status: Within functional limits for tasks assessed     SENSATION: Feels numbness into L arm to tips of fingers after a while when it's really hurting.  POSTURE: Rounded shoulders  CERVICAL ROM: * pain  Active ROM A/PROM (deg) eval  Flexion full  Extension Full *  Right lateral flexion Full * Left  Left lateral flexion Full * Left  Right rotation 75%*  Left rotation 75%*   (Blank rows = not tested)   UPPER EXTREMITY ROM:   A/P ROM Right eval Left eval  Shoulder flexion  125/140*  Shoulder extension    Shoulder abduction  125/142  Shoulder adduction    Shoulder internal rotation  52/65  Shoulder external rotation  54/70  Elbow flexion    Elbow extension    Wrist flexion    Wrist extension    Wrist ulnar deviation    Wrist radial deviation    Wrist pronation    Wrist supination    (Blank rows = not tested)  UPPER EXTREMITY MMT:  MMT Right eval Left eval  Shoulder flexion  4+*  Shoulder extension  4+  Shoulder abduction  4*  Shoulder adduction    Shoulder internal rotation  4-  Shoulder external rotation  4+  Middle trapezius    Lower trapezius    Elbow flexion  4+*  Elbow extension  5  Wrist flexion    Wrist extension    Wrist ulnar deviation    Wrist radial deviation    Wrist pronation    Wrist supination  5  Grip strength (lbs)    (Blank rows = not tested)  SHOULDER SPECIAL TESTS: Impingement tests: Neer impingement test: positive  and Hawkins/Kennedy impingement test: positive  Instability tests: Apprehension test: negative Rotator cuff assessment: Empty can test: positive , Full can test: negative, Gerber lift off test: negative, and Belly press test:  negative Biceps assessment: Yergason's test: positive  ULTT - negative ulnar, median and radial  JOINT MOBILITY TESTING:  WNL no pain  PALPATION:  Subscap origin and muscle belly, biceps LH tendon, UT, supraspinatus   TODAY'S TREATMENT:  DATE:  09/19/23: manual:  Supine for gentle intermittent cervical manual traction, and manual B suboccipital distraction with contract /relax with isometric cervical extension, 5 sec holds, 2 sets L levator stretch with gentle stabilization of L scapula, with isometric contract /relax for levator, 5 sec holds, 5 reps, 2 sets Gentle A/P glides humeral head in supine to improve tolerance to L shoulder ER motion  Kinesiotaping 2- I pieces extending from ant L shoulder to middle traps and lateral delts to upper traps, 35% pull distal to prox  Inst. in snags for L cervical rotation, she reported reduced pain with this movement  Applied cervical gel pack occipital region briefly as demo for pt to utilize at home.  09/17/23 Therapeutic Exercise: to improve strength and mobility.  Demo, verbal and tactile cues throughout for technique. UBE L1 x 4 min forward Review of current HEP - see below Manual Therapy: to decrease muscle spasm and pain and improve mobility STM/TPR to cervical paraspinals, NAGs into rotation, PA mobs, L levator, L UT, skilled palpation and monitoring during dry needling. Trigger Point Dry-Needling  Treatment instructions: Expect mild to moderate muscle soreness. S/S of pneumothorax if dry needled over a lung field, and to seek immediate medical attention should they occur. Patient verbalized understanding of these instructions and education. Patient Consent Given: Yes Education handout provided: Yes Muscles treated: L UT, L Levator, bil cervical multifidi C4-6 Electrical stimulation performed:  No Parameters: N/A Treatment response/outcome: Twitch Response Elicited and Palpable Increase in Muscle Length Ultrasound: x 8 min to L supraspinatus 1 MHz, 1.2 w/cm2 cont to decrease inflammation/pain  08/28/23 See pt ed and HEP   PATIENT EDUCATION: Education details: HEP review, TrDN  Person educated: Patient Education method: Explanation, Demonstration, and Handouts Education comprehension: verbalized understanding and returned demonstration  HOME EXERCISE PROGRAM: Access Code: [email or web address] URL: [email or web address] Date: 08/28/2023 Prepared by: Raynelle Fanning  Exercises - Sternocleidomastoid Stretch (Mirrored)  - 2 x daily - 7 x weekly - 1 sets - 3 reps - 30 hold - Isometric Shoulder Flexion at Wall  - 1 x daily - 7 x weekly - 1 sets - 5 reps - 10 sec hold - Isometric Shoulder Abduction - Arm Straight at Wall  - 1 x daily - 7 x weekly - 1 sets - 5 reps - 10 sec  hold - Isometric Shoulder Extension at Wall  - 1 x daily - 7 x weekly - 1 sets - 5 reps - 10 sec hold - Standing Isometric Shoulder Internal Rotation at Doorway (Mirrored)  - 1 x daily - 7 x weekly - 1 sets - 5 reps - 10 sec hold - Isometric Shoulder External Rotation at Wall  - 1 x daily - 7 x weekly - 1 sets - 5 reps - 10 sec hold  Patient education:  - Trigger point dry needling -  Iontophoresis   ASSESSMENT:  CLINICAL IMPRESSION: Madeline Hawkins reports more tender, inflamed neck and L arm today.  Very tender to light palpation L supraspinatus, L cervical paraspinals C4. Deferred TPDN today at pts request due to muscle soreness.  Instead utilized gentle techniques to reduce radicular Sx L UE.  Applied kinesiotaping for relief as well. Responded well. Did not advance ex due to inflamed today.    Madeline Hawkins continues to demonstrate potential for improvement and would benefit from continued skilled therapy to address impairments.      OBJECTIVE IMPAIRMENTS: decreased activity tolerance, decreased ROM,  decreased strength, increased edema, increased muscle spasms, impaired  flexibility, impaired UE functional use, postural dysfunction, and pain.   ACTIVITY LIMITATIONS: carrying, lifting, sleeping, bathing, toileting, dressing, reach over head, and hygiene/grooming  PARTICIPATION LIMITATIONS: cleaning and occupation  PERSONAL FACTORS: Profession are also affecting patient's functional outcome.   REHAB POTENTIAL: Excellent  CLINICAL DECISION MAKING: Evolving/moderate complexity  EVALUATION COMPLEXITY: Low   GOALS: Goals reviewed with patient? Yes  SHORT TERM GOALS: Target date: 09/25/2023   Patient will be independent with initial HEP.  Baseline: no HEP Goal status: MET 09/17/23  2.  Decreased neck and shoulder pain by 50%  Baseline: 8/10 Goal status: IN PROGRESS 09/17/2023- 2/10 pain  3.  No UE numbness and tingling Baseline: intermittent N/T L arm into fingers Goal status: IN PROGRESS 09/17/23- no report of tingling today   LONG TERM GOALS: Target date: 10/23/2023   Patient will be independent with advanced/ongoing HEP to improve outcomes and carryover.  Baseline: initial HEP Goal status: IN PROGRESS  2.  Patient will report 85% improvement in neck and L shoulder pain to improve QOL.  Baseline: 8/10 Goal status: IN PROGRESS  3.  Improved shoulder strength to 5/5  Baseline: see objective chart Goal status: IN PROGRESS  4.  Patient to improve L shoulder and neck AROM to WNL without pain provocation to allow for increased ease of ADLs.  Baseline: see objective chart Goal status: IN PROGRESS  5.  Patient will report 30 on Quick Dash to demonstrate improved functional ability.  Baseline: 65 / 100 = 65.9 % Goal status: IN PROGRESS  6.  Patient will be able to sleep without L shoulder pain.   Baseline: hard to get to sleep due to pain Goal status: IN PROGRESS     PLAN:  PT FREQUENCY: 2x/week  PT DURATION:  12 sessions  PLANNED INTERVENTIONS:  97110-Therapeutic exercises, 97530- Therapeutic activity, 97112- Neuromuscular re-education, 97535- Self Care, 13086- Manual therapy, 97014- Electrical stimulation (unattended), 97035- Ultrasound, [id number]- Ionotophoresis 4mg /ml Dexamethasone, Patient/Family education, Taping, Dry Needling, Joint mobilization, Spinal mobilization, Cryotherapy, and Moist heat  PLAN FOR NEXT SESSION: work on decreasing pain and inflammation then progress ROM and strengthening, manual/DN to L upper quadrant, trial of ionto L supraspinatus    Madeline Hawkins, PT , DPT, OCS 09/19/2023, 4:21 PM

## 2023-09-23 ENCOUNTER — Ambulatory Visit: Payer: BC Managed Care – PPO | Admitting: Physical Therapy

## 2023-09-23 ENCOUNTER — Encounter: Payer: Self-pay | Admitting: Physical Therapy

## 2023-09-23 DIAGNOSIS — M25512 Pain in left shoulder: Secondary | ICD-10-CM | POA: Diagnosis not present

## 2023-09-23 DIAGNOSIS — R252 Cramp and spasm: Secondary | ICD-10-CM

## 2023-09-23 DIAGNOSIS — M542 Cervicalgia: Secondary | ICD-10-CM

## 2023-09-23 DIAGNOSIS — M25612 Stiffness of left shoulder, not elsewhere classified: Secondary | ICD-10-CM

## 2023-09-23 NOTE — Therapy (Signed)
OUTPATIENT PHYSICAL THERAPY SHOULDER TREATMENT   Patient Name: NANITA KEDROWSKI MRN: [id number] DOB:[birth year], 57 y.o., female Today's Date: 09/23/2023  END OF SESSION:  PT End of Session - 09/23/23 1446     Visit Number 4    Number of Visits 12    Date for PT Re-Evaluation 10/09/23    Authorization Type [employer] MCD    PT Start Time 1446    PT Stop Time 1529    PT Time Calculation (min) 43 min    Activity Tolerance Patient tolerated treatment well;Patient limited by pain    Behavior During Therapy [facility] for tasks assessed/performed              Past Medical History:  Diagnosis Date   Adjustment disorder with depressed mood 08/05/2015   Anxiety    Bartholin gland cyst    CIN I (cervical intraepithelial neoplasia I) 1989   cryo...also in 2008 .. had cryo    GERD (gastroesophageal reflux disease)    Hyperlipidemia    Hypertension    Intertrigo 04/13/2021   Lower abdominal pain 12/19/2022   Past Surgical History:  Procedure Laterality Date   ABDOMINAL HYSTERECTOMY  11/22/2011   Procedure: HYSTERECTOMY ABDOMINAL;  Surgeon: Ok Edwards, MD;  Location: [facility];  Service: Gynecology;  Laterality: N/A;   BARTHOLIN CYST MARSUPIALIZATION Left 12/02/2018   Procedure: BARTHOLIN MARSUPIALIZATION OF LEFT GLAND;  Surgeon: Genia Del, MD;  Location: [facility];  Service: Gynecology;  Laterality: Left;   colon polyps removed     GYNECOLOGIC CRYOSURGERY  07/07/2007   LAPAROSCOPIC LYSIS OF ADHESIONS  06/28/2015   Procedure: LAPAROSCOPIC LYSIS OF ABDOMINAL PELVIC ADHESIONS;  Surgeon: Ok Edwards, MD;  Location: [facility];  Service: Gynecology;;   LAPAROSCOPIC UNILATERAL SALPINGO OOPHERECTOMY Right 06/28/2015   Procedure: LAPAROSCOPIC RIGHT SALPINGO OOPHORECTOMY;  Surgeon: Ok Edwards, MD;  Location: [facility];  Service: Gynecology;  Laterality: Right;   LAPAROSCOPIC UNILATERAL SALPINGO OOPHERECTOMY Left 10/11/2016   Procedure: LAPAROSCOPIC UNILATERAL OOPHORECTOMY  with pelvic washings;  Surgeon: Ok Edwards, MD;  Location: [facility];  Service: Gynecology;  Laterality: Left;  HARMONIC SCAPEL   LAPAROSCOPY  11/22/2011   Procedure: LAPAROSCOPY DIAGNOSTIC;  Surgeon: Ok Edwards, MD;  Location: [facility];  Service: Gynecology;  Laterality: N/A;   OVARIAN CYST REMOVAL  11/22/2011   Procedure: OVARIAN CYSTECTOMY;  Surgeon: Ok Edwards, MD;  Location: [facility];  Service: Gynecology;  Laterality: Left;   SALPINGECTOMY     left for ectopic pregnancy   TUBAL LIGATION     right tubal ligation   Patient Active Problem List   Diagnosis Date Noted   Family history of colon cancer 02/07/2022   Chronic idiopathic constipation 04/13/2021   Family history of malignant neoplasm of gastrointestinal tract 04/13/2021   Family history of cervical cancer 04/13/2021   Hyperglycemia 04/13/2021   Vasomotor symptoms due to menopause 04/13/2021   Gastroesophageal reflux disease 02/01/2016   Hyperlipidemia 11/14/2012   HTN (hypertension) 02/04/2012    PCP: Anne Ng, NP   REFERRING PROVIDER: Anne Ng, NP   REFERRING DIAG:  M25.512 (ICD-10-CM) - Acute pain of left shoulder  M54.2 (ICD-10-CM) - Neck pain on left side    THERAPY DIAG:  Acute pain of left shoulder  Stiffness of left shoulder, not elsewhere classified  Cervicalgia  Cramp and spasm  Rationale for Evaluation and Treatment: Rehabilitation  ONSET DATE: two weeks ago  SUBJECTIVE:  SUBJECTIVE STATEMENT: Feels better today.  Hand dominance: Left  PERTINENT HISTORY: HTN  PAIN:  Are you having pain? Yes: NPRS scale: 2/10 Pain location: L UT Pain description: sharp Aggravating factors: turning head, all motions of the shoulder Relieving factors: muscle relaxor  PRECAUTIONS: None  RED  FLAGS: None   WEIGHT BEARING RESTRICTIONS: No  FALLS:  Has patient fallen in last 6 months? 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  DIAGNOSTIC FINDINGS:  none  PATIENT SURVEYS:  Quick Dash 65.9 / 100 = 65.9 %  COGNITION: Overall cognitive status: Within functional limits for tasks assessed     SENSATION: Feels numbness into L arm to tips of fingers after a while when it's really hurting.  POSTURE: Rounded shoulders  CERVICAL ROM: * pain  Active ROM A/PROM (deg) eval  Flexion full  Extension Full *  Right lateral flexion Full * Left  Left lateral flexion Full * Left  Right rotation 75%*  Left rotation 75%*   (Blank rows = not tested)   UPPER EXTREMITY ROM:   A/P ROM Right eval Left eval  Shoulder flexion  125/140*  Shoulder extension    Shoulder abduction  125/142  Shoulder adduction    Shoulder internal rotation  52/65  Shoulder external rotation  54/70  Elbow flexion    Elbow extension    Wrist flexion    Wrist extension    Wrist ulnar deviation    Wrist radial deviation    Wrist pronation    Wrist supination    (Blank rows = not tested)  UPPER EXTREMITY MMT:  MMT Right eval Left eval  Shoulder flexion  4+*  Shoulder extension  4+  Shoulder abduction  4*  Shoulder adduction    Shoulder internal rotation  4-  Shoulder external rotation  4+  Middle trapezius    Lower trapezius    Elbow flexion  4+*  Elbow extension  5  Wrist flexion    Wrist extension    Wrist ulnar deviation    Wrist radial deviation    Wrist pronation    Wrist supination  5  Grip strength (lbs)    (Blank rows = not tested)  SHOULDER SPECIAL TESTS: Impingement tests: Neer impingement test:  positive  and Hawkins/Kennedy impingement test: positive  Instability tests: Apprehension test: negative Rotator cuff assessment: Empty can test: positive , Full can test: negative, Gerber lift off test: negative, and Belly press test: negative Biceps assessment: Yergason's test: positive  ULTT - negative ulnar, median and radial  JOINT MOBILITY TESTING:  WNL no pain  PALPATION:  Subscap origin and muscle belly, biceps LH tendon, UT, supraspinatus   TODAY'S TREATMENT:  DATE:   09/23/23 Therapeutic Exercise: to improve strength and mobility.  Demo, verbal and tactile cues throughout for technique. UBE x 4 forward Review of HEP Self snag - extension x 10 with pillowcase 1st rib mob with pillow case S/L shoulder ER x 10 - no resistance Manual Therapy: to decrease muscle spasm and pain and improve mobility STM/TPR to L UT/LS, cervical paraspinals, NAGs into rotation Iontophoresis: applied Ionto Patch (#1 of 6) to L anterior shoulder - Dexamethasone 1 mL, 80 mA-min, 4-6 hr wear time  after education on precautions, indications, and how and when to remove patch.   09/19/23: manual:  Supine for gentle intermittent cervical manual traction, and manual B suboccipital distraction with contract /relax with isometric cervical extension, 5 sec holds, 2 sets L levator stretch with gentle stabilization of L scapula, with isometric contract /relax for levator, 5 sec holds, 5 reps, 2 sets Gentle A/P glides humeral head in supine to improve tolerance to L shoulder ER motion  Kinesiotaping 2- I pieces extending from ant L shoulder to middle traps and lateral delts to upper traps, 35% pull distal to prox  Inst. in snags for L cervical rotation, she reported reduced pain with this movement  Applied cervical gel pack occipital region briefly as demo for pt to utilize at  home.  09/17/23 Therapeutic Exercise: to improve strength and mobility.  Demo, verbal and tactile cues throughout for technique. UBE L1 x 4 min forward Review of current HEP - see below Manual Therapy: to decrease muscle spasm and pain and improve mobility STM/TPR to cervical paraspinals, NAGs into rotation, PA mobs, L levator, L UT, skilled palpation and monitoring during dry needling. Trigger Point Dry-Needling  Treatment instructions: Expect mild to moderate muscle soreness. S/S of pneumothorax if dry needled over a lung field, and to seek immediate medical attention should they occur. Patient verbalized understanding of these instructions and education. Patient Consent Given: Yes Education handout provided: Yes Muscles treated: L UT, L Levator, bil cervical multifidi C4-6 Electrical stimulation performed: No Parameters: N/A Treatment response/outcome: Twitch Response Elicited and Palpable Increase in Muscle Length Ultrasound: x 8 min to L supraspinatus 1 MHz, 1.2 w/cm2 cont to decrease inflammation/pain  08/28/23 See pt ed and HEP   PATIENT EDUCATION: Education details: HEP review, TrDN  Person educated: Patient Education method: Explanation, Demonstration, and Handouts Education comprehension: verbalized understanding and returned demonstration  HOME EXERCISE PROGRAM: Access Code: [email or web address] URL: [email or web address] Date: 09/23/2023 Prepared by: Harrie Foreman  Exercises - Sternocleidomastoid Stretch (Mirrored)  - 2 x daily - 7 x weekly - 1 sets - 3 reps - 30 hold - Isometric Shoulder Flexion at Wall  - 1 x daily - 7 x weekly - 1 sets - 5 reps - 10 sec hold - Isometric Shoulder Abduction - Arm Straight at Wall  - 1 x daily - 7 x weekly - 1 sets - 5 reps - 10 sec  hold - Isometric Shoulder Extension at Wall  - 1 x daily - 7 x weekly - 1 sets - 5 reps - 10 sec hold - Standing Isometric Shoulder Internal Rotation at Doorway (Mirrored)  - 1 x daily - 7 x weekly  - 1 sets - 5 reps - 10 sec hold - Isometric Shoulder External Rotation at Wall  - 1 x daily - 7 x weekly - 1 sets - 5 reps - 10 sec hold - Cervical Extension AROM with Strap  - 1 x daily - 7 x weekly - 1 sets -  10 reps - Sidelying Shoulder External Rotation  - 1 x daily - 7 x weekly - 1-3 sets - 10 reps - Gentle Levator Scapulae Stretch  - 1 x daily - 7 x weekly - 1 sets - 3 reps - 30 sec hold   Patient education:  - Trigger point dry needling -  Iontophoresis   ASSESSMENT:  CLINICAL IMPRESSION: SEQUOYA MOES reported less pain today, so progressed exercises for both neck and shoulder.  After set of s/l shoulder ER, did report some more tingling in L hand so had her stop, to start without weight to tolerance.  She was too sore after TrDN so not interested in that again, so focused on manual therapy followed by ionto patch to L anterior shoulder after education.    QUINLAN BOOM continues to demonstrate potential for improvement and would benefit from continued skilled therapy to address impairments.      OBJECTIVE IMPAIRMENTS: decreased activity tolerance, decreased ROM, decreased strength, increased edema, increased muscle spasms, impaired flexibility, impaired UE functional use, postural dysfunction, and pain.   ACTIVITY LIMITATIONS: carrying, lifting, sleeping, bathing, toileting, dressing, reach over head, and hygiene/grooming  PARTICIPATION LIMITATIONS: cleaning and occupation  PERSONAL FACTORS: Profession are also affecting patient's functional outcome.   REHAB POTENTIAL: Excellent  CLINICAL DECISION MAKING: Evolving/moderate complexity  EVALUATION COMPLEXITY: Low   GOALS: Goals reviewed with patient? Yes  SHORT TERM GOALS: Target date: 09/25/2023   Patient will be independent with initial HEP.  Baseline: no HEP Goal status: MET 09/17/23  2.  Decreased neck and shoulder pain by 50%  Baseline: 8/10 Goal status: IN PROGRESS 09/17/2023- 2/10 pain  3.  No UE  numbness and tingling Baseline: intermittent N/T L arm into fingers Goal status: IN PROGRESS 09/17/23- no report of tingling today   LONG TERM GOALS: Target date: 10/23/2023   Patient will be independent with advanced/ongoing HEP to improve outcomes and carryover.  Baseline: initial HEP Goal status: IN PROGRESS  2.  Patient will report 85% improvement in neck and L shoulder pain to improve QOL.  Baseline: 8/10 Goal status: IN PROGRESS  3.  Improved shoulder strength to 5/5  Baseline: see objective chart Goal status: IN PROGRESS  4.  Patient to improve L shoulder and neck AROM to WNL without pain provocation to allow for increased ease of ADLs.  Baseline: see objective chart Goal status: IN PROGRESS  5.  Patient will report 23 on Quick Dash to demonstrate improved functional ability.  Baseline: 65 / 100 = 65.9 % Goal status: IN PROGRESS  6.  Patient will be able to sleep without L shoulder pain.   Baseline: hard to get to sleep due to pain Goal status: IN PROGRESS     PLAN:  PT FREQUENCY: 2x/week  PT DURATION:  12 sessions  PLANNED INTERVENTIONS: 97110-Therapeutic exercises, 97530- Therapeutic activity, 97112- Neuromuscular re-education, 97535- Self Care, 16109- Manual therapy, 97014- Electrical stimulation (unattended), 97035- Ultrasound, 60454- Ionotophoresis 4mg /ml Dexamethasone, Patient/Family education, Taping, Dry Needling, Joint mobilization, Spinal mobilization, Cryotherapy, and Moist heat  PLAN FOR NEXT SESSION: work on decreasing pain and inflammation then progress ROM and strengthening,  trial of ionto L supraspinatus, didn't like TrDN very sore afterwards   Jena Gauss, PT , DPT 09/23/2023, 4:51 PM

## 2023-09-30 ENCOUNTER — Ambulatory Visit (INDEPENDENT_AMBULATORY_CARE_PROVIDER_SITE_OTHER): Payer: BC Managed Care – PPO | Admitting: Nurse Practitioner

## 2023-09-30 ENCOUNTER — Ambulatory Visit: Payer: BC Managed Care – PPO | Admitting: Physical Therapy

## 2023-09-30 ENCOUNTER — Encounter: Payer: Self-pay | Admitting: Nurse Practitioner

## 2023-09-30 VITALS — BP 125/80 | HR 83 | Temp 98.3°F | Resp 18 | Wt 195.8 lb

## 2023-09-30 DIAGNOSIS — J069 Acute upper respiratory infection, unspecified: Secondary | ICD-10-CM | POA: Diagnosis not present

## 2023-09-30 NOTE — Progress Notes (Signed)
Acute Office Visit  Subjective:    Patient ID: Madeline Hawkins, female    DOB: November 29, 1965, 57 y.o.   MRN: [id number]  Chief Complaint  Patient presents with   Acute Visit    PT C/O of coughing, fatigue and body aches since yesterday. PT didn't take anything for symptoms. PT state grand kids were diagnosed with pneumonia.    URI  This is a new problem. The current episode started yesterday. The problem has been unchanged. There has been no fever. Associated symptoms include chest pain, congestion and coughing. Pertinent negatives include no abdominal pain, diarrhea, dysuria, ear pain, headaches, joint pain, joint swelling, nausea, neck pain, plugged ear sensation, rash, rhinorrhea, sinus pain, sneezing, sore throat, swollen glands, vomiting or wheezing. She has tried nothing for the symptoms.  No tobacco use or second hand smoke exposure. No recent travel. Sick contact with family members. She declined POCT COVID and FLU test, stating nasal swab is too painful  Outpatient Medications Prior to Visit  Medication Sig   fluticasone (FLONASE) 50 MCG/ACT nasal spray Place 2 sprays into both nostrils daily.   hydrochlorothiazide (HYDRODIURIL) 12.5 MG tablet TAKE 1 TABLET BY MOUTH EVERY DAY   KLOR-CON M20 20 MEQ tablet TAKE 1 TABLET BY MOUTH EVERY DAY   methocarbamol (ROBAXIN) 500 MG tablet Take 1 tablet (500 mg total) by mouth every 8 (eight) hours as needed for muscle spasms.   Multiple Vitamins-Minerals (WOMENS MULTIVITAMIN PLUS) TABS Take by mouth daily.   naproxen (NAPROSYN) 500 MG tablet Take 1 tablet (500 mg total) by mouth 2 (two) times daily with a meal.   ondansetron (ZOFRAN-ODT) 4 MG disintegrating tablet Take 1 tablet (4 mg total) by mouth every 8 (eight) hours as needed for nausea or vomiting.   pantoprazole (PROTONIX) 20 MG tablet Take 1 tablet (20 mg total) by mouth daily.   rosuvastatin (CRESTOR) 20 MG tablet TAKE 1 TABLET BY MOUTH EVERY DAY   venlafaxine XR (EFFEXOR XR) 37.5 MG  24 hr capsule Take 1 capsule (37.5 mg total) by mouth daily with breakfast.   No facility-administered medications prior to visit.    Reviewed past medical and social history.  Review of Systems  HENT:  Positive for congestion. Negative for ear pain, rhinorrhea, sinus pain, sneezing and sore throat.   Respiratory:  Positive for cough. Negative for wheezing.   Cardiovascular:  Positive for chest pain.  Gastrointestinal:  Negative for abdominal pain, diarrhea, nausea and vomiting.  Genitourinary:  Negative for dysuria.  Musculoskeletal:  Negative for joint pain and neck pain.  Skin:  Negative for rash.  Neurological:  Negative for headaches.   Per HPI     Objective:    Physical Exam Vitals and nursing note reviewed.  Constitutional:      General: She is not in acute distress.    Appearance: She is not ill-appearing or diaphoretic.  Cardiovascular:     Rate and Rhythm: Normal rate and regular rhythm.     Pulses: Normal pulses.     Heart sounds: Normal heart sounds.  Pulmonary:     Effort: Pulmonary effort is normal.     Breath sounds: Normal breath sounds.  Neurological:     Mental Status: She is alert and oriented to person, place, and time.  Psychiatric:        Mood and Affect: Mood normal.        Behavior: Behavior normal.     BP 125/80 (BP Location: Left Arm, Patient Position: Sitting, Cuff  Size: Large)   Pulse 83   Temp 98.3 F (36.8 C) (Oral)   Resp 18   Wt 195 lb 12.8 oz (88.8 kg)   LMP 11/10/2011   SpO2 100%   BMI 34.68 kg/m    No results found for any visits on 09/30/23.     Assessment & Plan:   Problem List Items Addressed This Visit   None Visit Diagnoses     Viral upper respiratory tract infection    -  Primary     URI Instructions: Avoid decongestants if you have high blood pressure. Encourage adequate oral hydration. Use over-the-counter  cold" medicine  such as Dayquil/nyquil for cough and congestion.  Ok to use coricidin HBP for cough  and congestion if you have HYPERTENSION Use mucinex DM or Robitussin  or delsym for cough without congestion  You can use plain "Tylenol" or "Advil" for fever, chills and achyness. Use cool mist humidifier at bedtime to help with nasal congestion and cough. Cold/cough medications may have tylenol or ibuprofen or guaifenesin or dextromethophan in them, so be careful not to take beyond the recommended dose for each of these medications.  "Common cold" symptoms are usually triggered by a virus.  The antibiotics are usually not necessary. On average, a" viral cold" illness may take 7-10 days to resolve. Please, make an appointment if you are not better or if you're worse.   No orders of the defined types were placed in this encounter.  Return if symptoms worsen or fail to improve.  [name], NP

## 2023-09-30 NOTE — Patient Instructions (Signed)
URI Instructions: Avoid decongestants if you have high blood pressure. Encourage adequate oral hydration. Use over-the-counter  cold" medicine  such as Dayquil/nyquil for cough and congestion.  Ok to use coricidin HBP for cough and congestion if you have HYPERTENSION Use mucinex DM or Robitussin  or delsym for cough without congestion  You can use plain "Tylenol" or "Advil" for fever, chills and achyness. Use cool mist humidifier at bedtime to help with nasal congestion and cough.  Cold/cough medications may have tylenol or ibuprofen or guaifenesin or dextromethophan in them, so be careful not to take beyond the recommended dose for each of these medications.   "Common cold" symptoms are usually triggered by a virus.  The antibiotics are usually not necessary. On average, a" viral cold" illness may take 7-10 days to resolve. Please, make an appointment if you are not better or if you're worse.

## 2023-10-02 ENCOUNTER — Ambulatory Visit: Payer: BC Managed Care – PPO | Admitting: Physical Therapy

## 2023-10-02 NOTE — Therapy (Incomplete)
OUTPATIENT PHYSICAL THERAPY SHOULDER TREATMENT   Patient Name: Madeline Hawkins MRN: [id number] DOB:1966-03-23, 57 y.o., female Today's Date: 10/02/2023  END OF SESSION:     Past Medical History:  Diagnosis Date   Adjustment disorder with depressed mood 08/05/2015   Anxiety    Bartholin gland cyst    CIN I (cervical intraepithelial neoplasia I) 1989   cryo...also in 2008 .. had cryo    GERD (gastroesophageal reflux disease)    Hyperlipidemia    Hypertension    Intertrigo 04/13/2021   Lower abdominal pain 12/19/2022   Past Surgical History:  Procedure Laterality Date   ABDOMINAL HYSTERECTOMY  11/22/2011   Procedure: HYSTERECTOMY ABDOMINAL;  Surgeon: Ok Edwards, MD;  Location: [facility];  Service: Gynecology;  Laterality: N/A;   BARTHOLIN CYST MARSUPIALIZATION Left 12/02/2018   Procedure: BARTHOLIN MARSUPIALIZATION OF LEFT GLAND;  Surgeon: Genia Del, MD;  Location: [facility];  Service: Gynecology;  Laterality: Left;   colon polyps removed     GYNECOLOGIC CRYOSURGERY  07/07/2007   LAPAROSCOPIC LYSIS OF ADHESIONS  06/28/2015   Procedure: LAPAROSCOPIC LYSIS OF ABDOMINAL PELVIC ADHESIONS;  Surgeon: Ok Edwards, MD;  Location: [facility];  Service: Gynecology;;   LAPAROSCOPIC UNILATERAL SALPINGO OOPHERECTOMY Right 06/28/2015   Procedure: LAPAROSCOPIC RIGHT SALPINGO OOPHORECTOMY;  Surgeon: Ok Edwards, MD;  Location: [facility];  Service: Gynecology;  Laterality: Right;   LAPAROSCOPIC UNILATERAL SALPINGO OOPHERECTOMY Left 10/11/2016   Procedure: LAPAROSCOPIC UNILATERAL OOPHORECTOMY with pelvic washings;  Surgeon: Ok Edwards, MD;  Location: [facility];  Service: Gynecology;  Laterality: Left;  HARMONIC SCAPEL   LAPAROSCOPY  11/22/2011   Procedure: LAPAROSCOPY DIAGNOSTIC;  Surgeon: Ok Edwards, MD;  Location: [facility];  Service: Gynecology;  Laterality: N/A;   OVARIAN CYST REMOVAL  11/22/2011   Procedure: OVARIAN CYSTECTOMY;  Surgeon: Ok Edwards, MD;   Location: [facility];  Service: Gynecology;  Laterality: Left;   SALPINGECTOMY     left for ectopic pregnancy   TUBAL LIGATION     right tubal ligation   Patient Active Problem List   Diagnosis Date Noted   Family history of colon cancer 02/07/2022   Chronic idiopathic constipation 04/13/2021   Family history of malignant neoplasm of gastrointestinal tract 04/13/2021   Family history of cervical cancer 04/13/2021   Hyperglycemia 04/13/2021   Vasomotor symptoms due to menopause 04/13/2021   Gastroesophageal reflux disease 02/01/2016   Hyperlipidemia 11/14/2012   HTN (hypertension) 02/04/2012    PCP: Anne Ng, NP   REFERRING PROVIDER: Anne Ng, NP   REFERRING DIAG:  M25.512 (ICD-10-CM) - Acute pain of left shoulder  M54.2 (ICD-10-CM) - Neck pain on left side    THERAPY DIAG:  No diagnosis found.  Rationale for Evaluation and Treatment: Rehabilitation  ONSET DATE: two weeks ago  SUBJECTIVE:  SUBJECTIVE STATEMENT: *** Hand dominance: Left  PERTINENT HISTORY: HTN  PAIN:  Are you having pain? Yes: NPRS scale: 2/10 Pain location: L UT Pain description: sharp Aggravating factors: turning head, all motions of the shoulder Relieving factors: muscle relaxor  PRECAUTIONS: None  RED FLAGS: None   WEIGHT BEARING RESTRICTIONS: No  FALLS:  Has patient fallen in last 6 months? 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  DIAGNOSTIC FINDINGS:  none  PATIENT SURVEYS:  Quick Dash 65.9 / 100 = 65.9  %  COGNITION: Overall cognitive status: Within functional limits for tasks assessed     SENSATION: Feels numbness into L arm to tips of fingers after a while when it's really hurting.  POSTURE: Rounded shoulders  CERVICAL ROM: * pain  Active ROM A/PROM (deg) eval  Flexion full  Extension Full *  Right lateral flexion Full * Left  Left lateral flexion Full * Left  Right rotation 75%*  Left rotation 75%*   (Blank rows = not tested)   UPPER EXTREMITY ROM:   A/P ROM Right eval Left eval  Shoulder flexion  125/140*  Shoulder extension    Shoulder abduction  125/142  Shoulder adduction    Shoulder internal rotation  52/65  Shoulder external rotation  54/70  Elbow flexion    Elbow extension    Wrist flexion    Wrist extension    Wrist ulnar deviation    Wrist radial deviation    Wrist pronation    Wrist supination    (Blank rows = not tested)  UPPER EXTREMITY MMT:  MMT Right eval Left eval  Shoulder flexion  4+*  Shoulder extension  4+  Shoulder abduction  4*  Shoulder adduction    Shoulder internal rotation  4-  Shoulder external rotation  4+  Middle trapezius    Lower trapezius    Elbow flexion  4+*  Elbow extension  5  Wrist flexion    Wrist extension    Wrist ulnar deviation    Wrist radial deviation    Wrist pronation    Wrist supination  5  Grip strength (lbs)    (Blank rows = not tested)  SHOULDER SPECIAL TESTS: Impingement tests: Neer impingement test: positive  and Hawkins/Kennedy impingement test: positive  Instability tests: Apprehension test: negative Rotator cuff assessment: Empty can test: positive , Full can test: negative, Gerber lift off test: negative, and Belly press test: negative Biceps assessment: Yergason's test: positive  ULTT - negative ulnar, median and radial  JOINT MOBILITY TESTING:  WNL no pain  PALPATION:  Subscap origin and muscle belly, biceps LH tendon, UT, supraspinatus   TODAY'S TREATMENT:  DATE:   10/02/23 Therapeutic Exercise: to improve strength and mobility.  Demo, verbal and tactile cues throughout for technique. UBE x 4 forward Self snag - extension x 10 with pillowcase 1st rib mob with pillow case S/L shoulder ER x 10 - no resistance Manual Therapy: to decrease muscle spasm and pain and improve mobility STM/TPR to L UT/LS, cervical paraspinals, NAGs into rotation Iontophoresis: applied Ionto Patch (#1*** of 6) to L anterior shoulder - Dexamethasone 1 mL, 80 mA-min, 4-6 hr wear time  after education on precautions, indications, and how and when to remove patch.   09/23/23 Therapeutic Exercise: to improve strength and mobility.  Demo, verbal and tactile cues throughout for technique. UBE x 4 forward Review of HEP Self snag - extension x 10 with pillowcase 1st rib mob with pillow case S/L shoulder ER x 10 - no resistance Manual Therapy: to decrease muscle spasm and pain and improve mobility STM/TPR to L UT/LS, cervical paraspinals, NAGs into rotation Iontophoresis: applied Ionto Patch (#1 of 6) to L anterior shoulder - Dexamethasone 1 mL, 80 mA-min, 4-6 hr wear time  after education on precautions, indications, and how and when to remove patch.   09/19/23: manual:  Supine for gentle intermittent cervical manual traction, and manual B suboccipital distraction with contract /relax with isometric cervical extension, 5 sec holds, 2 sets L levator stretch with gentle stabilization of L scapula, with isometric contract /relax for levator, 5 sec holds, 5 reps, 2 sets Gentle A/P glides humeral head in supine to improve tolerance to L shoulder ER motion  Kinesiotaping 2- I pieces extending from ant L shoulder to middle traps and lateral delts to upper traps, 35% pull distal to prox  Inst. in snags for L cervical rotation, she reported reduced pain with this  movement  Applied cervical gel pack occipital region briefly as demo for pt to utilize at home.  09/17/23 Therapeutic Exercise: to improve strength and mobility.  Demo, verbal and tactile cues throughout for technique. UBE L1 x 4 min forward Review of current HEP - see below Manual Therapy: to decrease muscle spasm and pain and improve mobility STM/TPR to cervical paraspinals, NAGs into rotation, PA mobs, L levator, L UT, skilled palpation and monitoring during dry needling. Trigger Point Dry-Needling  Treatment instructions: Expect mild to moderate muscle soreness. S/S of pneumothorax if dry needled over a lung field, and to seek immediate medical attention should they occur. Patient verbalized understanding of these instructions and education. Patient Consent Given: Yes Education handout provided: Yes Muscles treated: L UT, L Levator, bil cervical multifidi C4-6 Electrical stimulation performed: No Parameters: N/A Treatment response/outcome: Twitch Response Elicited and Palpable Increase in Muscle Length Ultrasound: x 8 min to L supraspinatus 1 MHz, 1.2 w/cm2 cont to decrease inflammation/pain  08/28/23 See pt ed and HEP   PATIENT EDUCATION: Education details: HEP review, TrDN  Person educated: Patient Education method: Explanation, Demonstration, and Handouts Education comprehension: verbalized understanding and returned demonstration  HOME EXERCISE PROGRAM: Access Code: [email or web address] URL: [email or web address] Date: 09/23/2023 Prepared by: Harrie Foreman  Exercises - Sternocleidomastoid Stretch (Mirrored)  - 2 x daily - 7 x weekly - 1 sets - 3 reps - 30 hold - Isometric Shoulder Flexion at Wall  - 1 x daily - 7 x weekly - 1 sets - 5 reps - 10 sec hold - Isometric Shoulder Abduction - Arm Straight at Wall  - 1 x daily - 7 x weekly - 1 sets - 5 reps - 10 sec  hold - Isometric Shoulder Extension at Wall  - 1 x daily - 7 x weekly - 1 sets - 5 reps - 10 sec hold -  Standing Isometric Shoulder Internal Rotation at Doorway (Mirrored)  - 1 x daily - 7 x weekly - 1 sets - 5 reps - 10 sec hold - Isometric Shoulder External Rotation at Wall  - 1 x daily - 7 x weekly - 1 sets - 5 reps - 10 sec hold - Cervical Extension AROM with Strap  - 1 x daily - 7 x weekly - 1 sets - 10 reps - Sidelying Shoulder External Rotation  - 1 x daily - 7 x weekly - 1-3 sets - 10 reps - Gentle Levator Scapulae Stretch  - 1 x daily - 7 x weekly - 1 sets - 3 reps - 30 sec hold   Patient education:  - Trigger point dry needling -  Iontophoresis   ASSESSMENT:  CLINICAL IMPRESSION: ***   Madeline Hawkins continues to demonstrate potential for improvement and would benefit from continued skilled therapy to address impairments.      OBJECTIVE IMPAIRMENTS: decreased activity tolerance, decreased ROM, decreased strength, increased edema, increased muscle spasms, impaired flexibility, impaired UE functional use, postural dysfunction, and pain.   ACTIVITY LIMITATIONS: carrying, lifting, sleeping, bathing, toileting, dressing, reach over head, and hygiene/grooming  PARTICIPATION LIMITATIONS: cleaning and occupation  PERSONAL FACTORS: Profession are also affecting patient's functional outcome.   REHAB POTENTIAL: Excellent  CLINICAL DECISION MAKING: Evolving/moderate complexity  EVALUATION COMPLEXITY: Low   GOALS: Goals reviewed with patient? Yes  SHORT TERM GOALS: Target date: 09/25/2023   Patient will be independent with initial HEP.  Baseline: no HEP Goal status: MET 09/17/23  2.  Decreased neck and shoulder pain by 50%  Baseline: 8/10 Goal status: IN PROGRESS 09/17/2023- 2/10 pain  3.  No UE numbness and tingling Baseline: intermittent N/T L arm into fingers Goal status: IN PROGRESS 09/17/23- no report of tingling today   LONG TERM GOALS: Target date: 10/23/2023   Patient will be independent with advanced/ongoing HEP to improve outcomes and carryover.  Baseline:  initial HEP Goal status: IN PROGRESS  2.  Patient will report 85% improvement in neck and L shoulder pain to improve QOL.  Baseline: 8/10 Goal status: IN PROGRESS  3.  Improved shoulder strength to 5/5  Baseline: see objective chart Goal status: IN PROGRESS  4.  Patient to improve L shoulder and neck AROM to WNL without pain provocation to allow for increased ease of ADLs.  Baseline: see objective chart Goal status: IN PROGRESS  5.  Patient will report 57 on Quick Dash to demonstrate improved functional ability.  Baseline: 65 / 100 = 65.9 % Goal status: IN PROGRESS  6.  Patient will be able to sleep without L shoulder pain.   Baseline: hard to get to sleep due to pain Goal status: IN PROGRESS     PLAN:  PT FREQUENCY: 2x/week  PT DURATION:  12 sessions  PLANNED INTERVENTIONS: 97110-Therapeutic exercises, 97530- Therapeutic activity, 97112- Neuromuscular re-education, 97535- Self Care, 16109- Manual therapy, 97014- Electrical stimulation (unattended), 97035- Ultrasound, 60454- Ionotophoresis 4mg /ml Dexamethasone, Patient/Family education, Taping, Dry Needling, Joint mobilization, Spinal mobilization, Cryotherapy, and Moist heat  PLAN FOR NEXT SESSION: work on decreasing pain and inflammation then progress ROM and strengthening,  trial of ionto L supraspinatus, didn't like TrDN very sore afterwards   Solon Palm, PT  10/02/2023, 7:43 AM

## 2023-10-05 ENCOUNTER — Emergency Department (HOSPITAL_BASED_OUTPATIENT_CLINIC_OR_DEPARTMENT_OTHER)
Admission: EM | Admit: 2023-10-05 | Discharge: 2023-10-05 | Disposition: A | Discharge status: Home / Self Care | Payer: BC Managed Care – PPO | Attending: Emergency Medicine | Admitting: Emergency Medicine

## 2023-10-05 ENCOUNTER — Other Ambulatory Visit: Payer: Self-pay

## 2023-10-05 ENCOUNTER — Emergency Department (HOSPITAL_BASED_OUTPATIENT_CLINIC_OR_DEPARTMENT_OTHER): Payer: BC Managed Care – PPO

## 2023-10-05 ENCOUNTER — Encounter (HOSPITAL_BASED_OUTPATIENT_CLINIC_OR_DEPARTMENT_OTHER): Payer: Self-pay

## 2023-10-05 DIAGNOSIS — I10 Essential (primary) hypertension: Secondary | ICD-10-CM | POA: Diagnosis not present

## 2023-10-05 DIAGNOSIS — R0789 Other chest pain: Secondary | ICD-10-CM | POA: Diagnosis not present

## 2023-10-05 DIAGNOSIS — Z20822 Contact with and (suspected) exposure to covid-19: Secondary | ICD-10-CM | POA: Insufficient documentation

## 2023-10-05 DIAGNOSIS — Z79899 Other long term (current) drug therapy: Secondary | ICD-10-CM | POA: Insufficient documentation

## 2023-10-05 DIAGNOSIS — J069 Acute upper respiratory infection, unspecified: Secondary | ICD-10-CM | POA: Diagnosis not present

## 2023-10-05 DIAGNOSIS — J40 Bronchitis, not specified as acute or chronic: Secondary | ICD-10-CM | POA: Insufficient documentation

## 2023-10-05 DIAGNOSIS — R0602 Shortness of breath: Secondary | ICD-10-CM | POA: Diagnosis present

## 2023-10-05 LAB — BASIC METABOLIC PANEL
Anion gap: 8 (ref 5–15)
BUN: 14 mg/dL (ref 6–20)
CO2: 26 mmol/L (ref 22–32)
Calcium: 9.4 mg/dL (ref 8.9–10.3)
Chloride: 105 mmol/L (ref 98–111)
Creatinine, Ser: 0.7 mg/dL (ref 0.44–1.00)
GFR, Estimated: >60 mL/min (ref >60)
Glucose, Bld: 107 mg/dL — ABNORMAL HIGH (ref 70–99)
Potassium: 3.6 mmol/L (ref 3.5–5.1)
Sodium: 139 mmol/L (ref 135–145)

## 2023-10-05 LAB — CBC WITH DIFFERENTIAL/PLATELET
Abs Immature Granulocytes: 0 10*3/uL (ref 0.00–0.07)
Basophils Absolute: 0 10*3/uL (ref 0.0–0.1)
Basophils Relative: 0 %
Eosinophils Absolute: 0.1 10*3/uL (ref 0.0–0.5)
Eosinophils Relative: 3 %
HCT: 39.7 % (ref 36.0–46.0)
Hemoglobin: 12.7 g/dL (ref 12.0–15.0)
Immature Granulocytes: 0 %
Lymphocytes Relative: 36 %
Lymphs Abs: 1.9 10*3/uL (ref 0.7–4.0)
MCH: 27.1 pg (ref 26.0–34.0)
MCHC: 32 g/dL (ref 30.0–36.0)
MCV: 84.6 fL (ref 80.0–100.0)
Monocytes Absolute: 0.6 10*3/uL (ref 0.1–1.0)
Monocytes Relative: 12 %
Neutro Abs: 2.6 10*3/uL (ref 1.7–7.7)
Neutrophils Relative %: 49 %
Platelets: 167 10*3/uL (ref 150–400)
RBC: 4.69 MIL/uL (ref 3.87–5.11)
RDW: 15.2 % (ref 11.5–15.5)
WBC: 5.3 10*3/uL (ref 4.0–10.5)
nRBC: 0 % (ref 0.0–0.2)

## 2023-10-05 LAB — RESP PANEL BY RT-PCR (RSV, FLU A&B, COVID)  RVPGX2
Influenza A by PCR: NEGATIVE
Influenza B by PCR: NEGATIVE
Resp Syncytial Virus by PCR: NEGATIVE
SARS Coronavirus 2 by RT PCR: NEGATIVE

## 2023-10-05 MED ORDER — ALBUTEROL SULFATE HFA 108 (90 BASE) MCG/ACT IN AERS
2.0000 | INHALATION_SPRAY | Freq: Once | RESPIRATORY_TRACT | Status: AC
  Administered 2023-10-05: 2 via RESPIRATORY_TRACT
  Filled 2023-10-05: qty 6.7

## 2023-10-05 MED ORDER — ALBUTEROL SULFATE (2.5 MG/3ML) 0.083% IN NEBU
INHALATION_SOLUTION | RESPIRATORY_TRACT | Status: AC
  Administered 2023-10-05: 2.5 mg
  Filled 2023-10-05: qty 3

## 2023-10-05 MED ORDER — IPRATROPIUM-ALBUTEROL 0.5-2.5 (3) MG/3ML IN SOLN
RESPIRATORY_TRACT | Status: AC
  Administered 2023-10-05: 3 mL
  Filled 2023-10-05: qty 3

## 2023-10-05 MED ORDER — PREDNISONE 10 MG PO TABS: 20.0000 mg | ORAL_TABLET | Freq: Every day | ORAL | 0 refills | Status: AC

## 2023-10-05 MED ORDER — PREDNISONE 50 MG PO TABS
60.0000 mg | ORAL_TABLET | Freq: Once | ORAL | Status: AC
  Administered 2023-10-05: 60 mg via ORAL
  Filled 2023-10-05: qty 1

## 2023-10-05 NOTE — ED Triage Notes (Signed)
The patient has had cough and shortness of breath for a few days.

## 2023-10-05 NOTE — ED Provider Notes (Signed)
[place] [facility] AT [facility] Provider Note  CSN: [id number] Arrival date & time: 10/05/23 9147  Chief Complaint(s) Cough, Shortness of Breath, and Chest Pain  HPI Madeline Hawkins is a 57 y.o. female here today for shortness of breath and cough.  Patient was seen by her PCP 3 days ago, diagnosed with URI.  She comes in today for increased dyspnea.  Patient has no history of asthma or COPD.   Past Medical History Past Medical History:  Diagnosis Date   Adjustment disorder with depressed mood 08/05/2015   Anxiety    Bartholin gland cyst    CIN I (cervical intraepithelial neoplasia I) 1989   cryo...also in 2008 .. had cryo    GERD (gastroesophageal reflux disease)    Hyperlipidemia    Hypertension    Intertrigo 04/13/2021   Lower abdominal pain 12/19/2022   Patient Active Problem List   Diagnosis Date Noted   Family history of colon cancer 02/07/2022   Chronic idiopathic constipation 04/13/2021   Family history of malignant neoplasm of gastrointestinal tract 04/13/2021   Family history of cervical cancer 04/13/2021   Hyperglycemia 04/13/2021   Vasomotor symptoms due to menopause 04/13/2021   Gastroesophageal reflux disease 02/01/2016   Hyperlipidemia 11/14/2012   HTN (hypertension) 02/04/2012   Home Medication(s) Prior to Admission medications   Medication Sig Start Date End Date Taking? Authorizing Provider  predniSONE (DELTASONE) 10 MG tablet Take 2 tablets (20 mg total) by mouth daily for 4 days. 10/05/23 10/09/23 Yes Anders Simmonds T, DO  fluticasone (FLONASE) 50 MCG/ACT nasal spray Place 2 sprays into both nostrils daily. 10/08/22   Garnette Gunner, MD  hydrochlorothiazide (HYDRODIURIL) 12.5 MG tablet TAKE 1 TABLET BY MOUTH EVERY DAY 05/10/23   Nche, Bonna Gains, NP  KLOR-CON M20 20 MEQ tablet TAKE 1 TABLET BY MOUTH EVERY DAY 05/10/23   Nche, Bonna Gains, NP  methocarbamol (ROBAXIN) 500 MG tablet Take 1 tablet (500 mg total) by mouth  every 8 (eight) hours as needed for muscle spasms. 08/14/23   Nche, Bonna Gains, NP  Multiple Vitamins-Minerals (WOMENS MULTIVITAMIN PLUS) TABS Take by mouth daily.    [provider]  naproxen (NAPROSYN) 500 MG tablet Take 1 tablet (500 mg total) by mouth 2 (two) times daily with a meal. 08/14/23   Nche, Bonna Gains, NP  ondansetron (ZOFRAN-ODT) 4 MG disintegrating tablet Take 1 tablet (4 mg total) by mouth every 8 (eight) hours as needed for nausea or vomiting. 12/19/22   Loyola Mast, MD  pantoprazole (PROTONIX) 20 MG tablet Take 1 tablet (20 mg total) by mouth daily. 08/14/23   Nche, Bonna Gains, NP  rosuvastatin (CRESTOR) 20 MG tablet TAKE 1 TABLET BY MOUTH EVERY DAY 05/10/23   Nche, Bonna Gains, NP  venlafaxine XR (EFFEXOR XR) 37.5 MG 24 hr capsule Take 1 capsule (37.5 mg total) by mouth daily with breakfast. 08/14/23   Nche, Bonna Gains, NP  Past Surgical History Past Surgical History:  Procedure Laterality Date   ABDOMINAL HYSTERECTOMY  11/22/2011   Procedure: HYSTERECTOMY ABDOMINAL;  Surgeon: Ok Edwards, MD;  Location: [facility];  Service: Gynecology;  Laterality: N/A;   BARTHOLIN CYST MARSUPIALIZATION Left 12/02/2018   Procedure: BARTHOLIN MARSUPIALIZATION OF LEFT GLAND;  Surgeon: Genia Del, MD;  Location: [facility];  Service: Gynecology;  Laterality: Left;   colon polyps removed     GYNECOLOGIC CRYOSURGERY  07/07/2007   LAPAROSCOPIC LYSIS OF ADHESIONS  06/28/2015   Procedure: LAPAROSCOPIC LYSIS OF ABDOMINAL PELVIC ADHESIONS;  Surgeon: Ok Edwards, MD;  Location: [facility];  Service: Gynecology;;   LAPAROSCOPIC UNILATERAL SALPINGO OOPHERECTOMY Right 06/28/2015   Procedure: LAPAROSCOPIC RIGHT SALPINGO OOPHORECTOMY;  Surgeon: Ok Edwards, MD;  Location: [facility];  Service: Gynecology;  Laterality: Right;    LAPAROSCOPIC UNILATERAL SALPINGO OOPHERECTOMY Left 10/11/2016   Procedure: LAPAROSCOPIC UNILATERAL OOPHORECTOMY with pelvic washings;  Surgeon: Ok Edwards, MD;  Location: [facility];  Service: Gynecology;  Laterality: Left;  HARMONIC SCAPEL   LAPAROSCOPY  11/22/2011   Procedure: LAPAROSCOPY DIAGNOSTIC;  Surgeon: Ok Edwards, MD;  Location: [facility];  Service: Gynecology;  Laterality: N/A;   OVARIAN CYST REMOVAL  11/22/2011   Procedure: OVARIAN CYSTECTOMY;  Surgeon: Ok Edwards, MD;  Location: [facility];  Service: Gynecology;  Laterality: Left;   SALPINGECTOMY     left for ectopic pregnancy   TUBAL LIGATION     right tubal ligation   Family History Family History  Problem Relation Age of Onset   Diabetes Brother    Ovarian cancer Mother    Ovarian cancer Paternal Aunt    Heart disease Maternal Grandmother    Breast cancer Maternal Grandmother        Age 93's   Breast cancer Maternal Aunt        Age 35's    Social History Social History   Tobacco Use   Smoking status: Never   Smokeless tobacco: Never  Vaping Use   Vaping status: Never Used  Substance Use Topics   Alcohol use: No    Alcohol/week: 0.0 standard drinks of alcohol   Drug use: No   Allergies Patient has no known allergies.  Review of Systems Review of Systems  Physical Exam Vital Signs  I have reviewed the triage vital signs BP (!) 156/100   Pulse 99   Temp 98 F (36.7 C) (Oral)   Resp 18   Ht 5\' 3"  (1.6 m)   Wt 88 kg   LMP 11/10/2011   SpO2 100%   BMI 34.37 kg/m   Physical Exam Vitals reviewed.  Cardiovascular:     Rate and Rhythm: Normal rate.  Pulmonary:     Effort: Pulmonary effort is normal.     Breath sounds: Examination of the right-upper field reveals wheezing. Examination of the left-upper field reveals wheezing. Examination of the right-middle field reveals wheezing. Examination of the left-middle field reveals wheezing. Examination of the right-lower field reveals wheezing.  Examination of the left-lower field reveals wheezing. Wheezing present.  Neurological:     General: No focal deficit present.     Mental Status: She is alert.     ED Results and Treatments Labs (all labs ordered are listed, but only abnormal results are displayed) Labs Reviewed  BASIC METABOLIC PANEL - Abnormal; Notable for the following components:      Result Value   Glucose, Bld 107 (*)    All other components within  normal limits  RESP PANEL BY RT-PCR (RSV, FLU A&B, COVID)  RVPGX2  CBC WITH DIFFERENTIAL/PLATELET                                                                                                                          Radiology DG Chest 2 View  Result Date: 10/05/2023 CLINICAL DATA:  Cough and shortness of breath. EXAM: CHEST - 2 VIEW COMPARISON:  08/06/2022 FINDINGS: The lungs are clear without focal pneumonia, edema, pneumothorax or pleural effusion. The cardiopericardial silhouette is within normal limits for size. No acute bony abnormality. IMPRESSION: No active cardiopulmonary disease. Electronically Signed   By: Kennith Center M.D.   On: 10/05/2023 09:31    Pertinent labs & imaging results that were available during my care of the patient were reviewed by me and considered in my medical decision making (see MDM for details).  Medications Ordered in ED Medications  ipratropium-albuterol (DUONEB) 0.5-2.5 (3) MG/3ML nebulizer solution (3 mLs  Given 10/05/23 0910)  albuterol (PROVENTIL) (2.5 MG/3ML) 0.083% nebulizer solution (2.5 mg  Given 10/05/23 0910)  albuterol (VENTOLIN HFA) 108 (90 Base) MCG/ACT inhaler 2 puff (2 puffs Inhalation Given 10/05/23 1002)  predniSONE (DELTASONE) tablet 60 mg (60 mg Oral Given 10/05/23 1002)                                                                                                                                     Procedures Procedures  (including critical care time)  Medical Decision Making / ED Course   This patient  presents to the ED for concern of shortness of breath, this involves an extensive number of treatment options, and is a complaint that carries with it a high risk of complications and morbidity.  The differential diagnosis includes pneumonia, viral syndrome, less likely PE, less likely ACS.  MDM: Patient with diffuse wheezing on exam.  She has no history of asthma or COPD.  Will obtain a chest x-ray.  Patient does not appear to be systemically ill, if there is a pneumonia believe she would be a risk for outpatient management.  Provided patient with nebulizer here in the emergency department for wheezing while we await chest x-ray.  Reassessment 10 AM-my independent review the patient's chest x-ray shows no pneumonia.  Patient without leukocytosis, renal function normal.  Likely viral bronchitis.  Will treat with albuterol and steroids.  Discussed labs and imaging  with the patient.  Patient agreeable with this plan.  Additional history obtained: -Additional history obtained from  -External records from outside source obtained and reviewed including: Chart review including previous notes, labs, imaging, consultation notes   Lab Tests: -I ordered, reviewed, and interpreted labs.   The pertinent results include:   Labs Reviewed  BASIC METABOLIC PANEL - Abnormal; Notable for the following components:      Result Value   Glucose, Bld 107 (*)    All other components within normal limits  RESP PANEL BY RT-PCR (RSV, FLU A&B, COVID)  [id number]  CBC WITH DIFFERENTIAL/PLATELET      EKG sinus rhythm, no ST segment depressions or elevations, no T wave inversions, no evidence of acute ischemia.  EKG Interpretation Date/Time:    Ventricular Rate:    PR Interval:    QRS Duration:    QT Interval:    QTC Calculation:   R Axis:      Text Interpretation:           Imaging Studies ordered: I ordered imaging studies including chest x-ray I independently visualized and interpreted imaging. I agree  with the radiologist interpretation   Medicines ordered and prescription drug management: Meds ordered this encounter  Medications   ipratropium-albuterol (DUONEB) 0.5-2.5 (3) MG/3ML nebulizer solution    Trinda Pascal B: cabinet override   albuterol (PROVENTIL) (2.5 MG/3ML) 0.083% nebulizer solution    Trinda Pascal B: cabinet override   albuterol (VENTOLIN HFA) 108 (90 Base) MCG/ACT inhaler 2 puff   predniSONE (DELTASONE) tablet 60 mg   predniSONE (DELTASONE) 10 MG tablet    Sig: Take 2 tablets (20 mg total) by mouth daily for 4 days.    Dispense:  8 tablet    Refill:  0    -I have reviewed the patients home medicines and have made adjustments as needed  Cardiac Monitoring: The patient was maintained on a cardiac monitor.  I personally viewed and interpreted the cardiac monitored which showed an underlying rhythm of: Sinus rhythm  Social Determinants of Health:  Factors impacting patients care include:    Reevaluation: After the interventions noted above, I reevaluated the patient and found that they have :improved  Co morbidities that complicate the patient evaluation  Past Medical History:  Diagnosis Date   Adjustment disorder with depressed mood 08/05/2015   Anxiety    Bartholin gland cyst    CIN I (cervical intraepithelial neoplasia I) 1989   cryo...also in 2008 .. had cryo    GERD (gastroesophageal reflux disease)    Hyperlipidemia    Hypertension    Intertrigo 04/13/2021   Lower abdominal pain 12/19/2022      Dispostion: I considered admission for this patient, however with reassuring workup believe she is appropriate for outpatient management.     Final Clinical Impression(s) / ED Diagnoses Final diagnoses:  Bronchitis  Viral upper respiratory tract infection     @PCDICTATION @    Anders Simmonds T, DO 10/05/23 1010

## 2023-10-05 NOTE — Discharge Instructions (Addendum)
You can use the albuterol inhaler that you were provided every 4-6 hours for shortness of breath.  You can also take 20 mg of prednisone for the next 4 days.  Please follow-up with your primary care doctor.  Return to the emergency department if you develop fever, or increased difficulty with your breathing.

## 2023-10-07 ENCOUNTER — Ambulatory Visit
Admission: RE | Admit: 2023-10-07 | Discharge: 2023-10-07 | Disposition: A | Discharge status: Home / Self Care | Payer: BC Managed Care – PPO | Source: Ambulatory Visit | Attending: Nurse Practitioner | Admitting: Nurse Practitioner

## 2023-10-07 ENCOUNTER — Encounter: Payer: BC Managed Care – PPO | Admitting: Physical Therapy

## 2023-10-07 DIAGNOSIS — Z1231 Encounter for screening mammogram for malignant neoplasm of breast: Secondary | ICD-10-CM

## 2023-10-08 NOTE — Therapy (Incomplete)
OUTPATIENT PHYSICAL THERAPY SHOULDER TREATMENT   Patient Name: THURSA BRANNUM MRN: [id number] DOB:April 08, 1966, 57 y.o., female Today's Date: 10/08/2023  END OF SESSION:     Past Medical History:  Diagnosis Date   Adjustment disorder with depressed mood 08/05/2015   Anxiety    Bartholin gland cyst    CIN I (cervical intraepithelial neoplasia I) 1989   cryo...also in 2008 .. had cryo    GERD (gastroesophageal reflux disease)    Hyperlipidemia    Hypertension    Intertrigo 04/13/2021   Lower abdominal pain 12/19/2022   Past Surgical History:  Procedure Laterality Date   ABDOMINAL HYSTERECTOMY  11/22/2011   Procedure: HYSTERECTOMY ABDOMINAL;  Surgeon: Ok Edwards, MD;  Location: [facility];  Service: Gynecology;  Laterality: N/A;   BARTHOLIN CYST MARSUPIALIZATION Left 12/02/2018   Procedure: BARTHOLIN MARSUPIALIZATION OF LEFT GLAND;  Surgeon: Genia Del, MD;  Location: [facility];  Service: Gynecology;  Laterality: Left;   colon polyps removed     GYNECOLOGIC CRYOSURGERY  07/07/2007   LAPAROSCOPIC LYSIS OF ADHESIONS  06/28/2015   Procedure: LAPAROSCOPIC LYSIS OF ABDOMINAL PELVIC ADHESIONS;  Surgeon: Ok Edwards, MD;  Location: [facility];  Service: Gynecology;;   LAPAROSCOPIC UNILATERAL SALPINGO OOPHERECTOMY Right 06/28/2015   Procedure: LAPAROSCOPIC RIGHT SALPINGO OOPHORECTOMY;  Surgeon: Ok Edwards, MD;  Location: [facility];  Service: Gynecology;  Laterality: Right;   LAPAROSCOPIC UNILATERAL SALPINGO OOPHERECTOMY Left 10/11/2016   Procedure: LAPAROSCOPIC UNILATERAL OOPHORECTOMY with pelvic washings;  Surgeon: Ok Edwards, MD;  Location: [facility];  Service: Gynecology;  Laterality: Left;  HARMONIC SCAPEL   LAPAROSCOPY  11/22/2011   Procedure: LAPAROSCOPY DIAGNOSTIC;  Surgeon: Ok Edwards, MD;  Location: [facility];  Service: Gynecology;  Laterality: N/A;   OVARIAN CYST REMOVAL  11/22/2011   Procedure: OVARIAN CYSTECTOMY;  Surgeon: Ok Edwards, MD;   Location: [facility];  Service: Gynecology;  Laterality: Left;   SALPINGECTOMY     left for ectopic pregnancy   TUBAL LIGATION     right tubal ligation   Patient Active Problem List   Diagnosis Date Noted   Family history of colon cancer 02/07/2022   Chronic idiopathic constipation 04/13/2021   Family history of malignant neoplasm of gastrointestinal tract 04/13/2021   Family history of cervical cancer 04/13/2021   Hyperglycemia 04/13/2021   Vasomotor symptoms due to menopause 04/13/2021   Gastroesophageal reflux disease 02/01/2016   Hyperlipidemia 11/14/2012   HTN (hypertension) 02/04/2012    PCP: Anne Ng, NP   REFERRING PROVIDER: Anne Ng, NP   REFERRING DIAG:  M25.512 (ICD-10-CM) - Acute pain of left shoulder  M54.2 (ICD-10-CM) - Neck pain on left side    THERAPY DIAG:  No diagnosis found.  Rationale for Evaluation and Treatment: Rehabilitation  ONSET DATE: two weeks ago  SUBJECTIVE:  SUBJECTIVE STATEMENT: *** Hand dominance: Left  PERTINENT HISTORY: HTN  PAIN:  Are you having pain? Yes: NPRS scale: 2/10 Pain location: L UT Pain description: sharp Aggravating factors: turning head, all motions of the shoulder Relieving factors: muscle relaxor  PRECAUTIONS: None  RED FLAGS: None   WEIGHT BEARING RESTRICTIONS: No  FALLS:  Has patient fallen in last 6 months? 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  DIAGNOSTIC FINDINGS:  none  PATIENT SURVEYS:  Quick Dash 65.9 / 100 = 65.9  %  COGNITION: Overall cognitive status: Within functional limits for tasks assessed     SENSATION: Feels numbness into L arm to tips of fingers after a while when it's really hurting.  POSTURE: Rounded shoulders  CERVICAL ROM: * pain  Active ROM A/PROM (deg) eval  Flexion full  Extension Full *  Right lateral flexion Full * Left  Left lateral flexion Full * Left  Right rotation 75%*  Left rotation 75%*   (Blank rows = not tested)   UPPER EXTREMITY ROM:   A/P ROM Right eval Left eval  Shoulder flexion  125/140*  Shoulder extension    Shoulder abduction  125/142  Shoulder adduction    Shoulder internal rotation  52/65  Shoulder external rotation  54/70  Elbow flexion    Elbow extension    Wrist flexion    Wrist extension    Wrist ulnar deviation    Wrist radial deviation    Wrist pronation    Wrist supination    (Blank rows = not tested)  UPPER EXTREMITY MMT:  MMT Right eval Left eval  Shoulder flexion  4+*  Shoulder extension  4+  Shoulder abduction  4*  Shoulder adduction    Shoulder internal rotation  4-  Shoulder external rotation  4+  Middle trapezius    Lower trapezius    Elbow flexion  4+*  Elbow extension  5  Wrist flexion    Wrist extension    Wrist ulnar deviation    Wrist radial deviation    Wrist pronation    Wrist supination  5  Grip strength (lbs)    (Blank rows = not tested)  SHOULDER SPECIAL TESTS: Impingement tests: Neer impingement test: positive  and Hawkins/Kennedy impingement test: positive  Instability tests: Apprehension test: negative Rotator cuff assessment: Empty can test: positive , Full can test: negative, Gerber lift off test: negative, and Belly press test: negative Biceps assessment: Yergason's test: positive  ULTT - negative ulnar, median and radial  JOINT MOBILITY TESTING:  WNL no pain  PALPATION:  Subscap origin and muscle belly, biceps LH tendon, UT, supraspinatus   TODAY'S TREATMENT:  DATE:   10/09/23 Therapeutic Exercise: to improve strength and mobility.  Demo, verbal and tactile cues throughout for technique. UBE x 4 forward Self snag - extension x 10 with pillowcase 1st rib mob with pillow case S/L shoulder ER x 10 - no resistance Manual Therapy: to decrease muscle spasm and pain and improve mobility STM/TPR to L UT/LS, cervical paraspinals, NAGs into rotation Iontophoresis: applied Ionto Patch (#*** of 6) to L anterior shoulder - Dexamethasone 1 mL, 80 mA-min, 4-6 hr wear time  after education on precautions, indications, and how and when to remove patch.   09/23/23 Therapeutic Exercise: to improve strength and mobility.  Demo, verbal and tactile cues throughout for technique. UBE x 4 forward Review of HEP Self snag - extension x 10 with pillowcase 1st rib mob with pillow case S/L shoulder ER x 10 - no resistance Manual Therapy: to decrease muscle spasm and pain and improve mobility STM/TPR to L UT/LS, cervical paraspinals, NAGs into rotation Iontophoresis: applied Ionto Patch (#1 of 6) to L anterior shoulder - Dexamethasone 1 mL, 80 mA-min, 4-6 hr wear time  after education on precautions, indications, and how and when to remove patch.   09/19/23: manual:  Supine for gentle intermittent cervical manual traction, and manual B suboccipital distraction with contract /relax with isometric cervical extension, 5 sec holds, 2 sets L levator stretch with gentle stabilization of L scapula, with isometric contract /relax for levator, 5 sec holds, 5 reps, 2 sets Gentle A/P glides humeral head in supine to improve tolerance to L shoulder ER motion  Kinesiotaping 2- I pieces extending from ant L shoulder to middle traps and lateral delts to upper traps, 35% pull distal to prox  Inst. in snags for L cervical rotation, she reported reduced pain with this  movement  Applied cervical gel pack occipital region briefly as demo for pt to utilize at home.  09/17/23 Therapeutic Exercise: to improve strength and mobility.  Demo, verbal and tactile cues throughout for technique. UBE L1 x 4 min forward Review of current HEP - see below Manual Therapy: to decrease muscle spasm and pain and improve mobility STM/TPR to cervical paraspinals, NAGs into rotation, PA mobs, L levator, L UT, skilled palpation and monitoring during dry needling. Trigger Point Dry-Needling  Treatment instructions: Expect mild to moderate muscle soreness. S/S of pneumothorax if dry needled over a lung field, and to seek immediate medical attention should they occur. Patient verbalized understanding of these instructions and education. Patient Consent Given: Yes Education handout provided: Yes Muscles treated: L UT, L Levator, bil cervical multifidi C4-6 Electrical stimulation performed: No Parameters: N/A Treatment response/outcome: Twitch Response Elicited and Palpable Increase in Muscle Length Ultrasound: x 8 min to L supraspinatus 1 MHz, 1.2 w/cm2 cont to decrease inflammation/pain  08/28/23 See pt ed and HEP   PATIENT EDUCATION: Education details: HEP review, TrDN  Person educated: Patient Education method: Explanation, Demonstration, and Handouts Education comprehension: verbalized understanding and returned demonstration  HOME EXERCISE PROGRAM: Access Code: [email or web address] URL: [email or web address] Date: 09/23/2023 Prepared by: Harrie Foreman  Exercises - Sternocleidomastoid Stretch (Mirrored)  - 2 x daily - 7 x weekly - 1 sets - 3 reps - 30 hold - Isometric Shoulder Flexion at Wall  - 1 x daily - 7 x weekly - 1 sets - 5 reps - 10 sec hold - Isometric Shoulder Abduction - Arm Straight at Wall  - 1 x daily - 7 x weekly - 1 sets - 5 reps - 10 sec  hold - Isometric Shoulder Extension at Wall  - 1 x daily - 7 x weekly - 1 sets - 5 reps - 10 sec hold -  Standing Isometric Shoulder Internal Rotation at Doorway (Mirrored)  - 1 x daily - 7 x weekly - 1 sets - 5 reps - 10 sec hold - Isometric Shoulder External Rotation at Wall  - 1 x daily - 7 x weekly - 1 sets - 5 reps - 10 sec hold - Cervical Extension AROM with Strap  - 1 x daily - 7 x weekly - 1 sets - 10 reps - Sidelying Shoulder External Rotation  - 1 x daily - 7 x weekly - 1-3 sets - 10 reps - Gentle Levator Scapulae Stretch  - 1 x daily - 7 x weekly - 1 sets - 3 reps - 30 sec hold   Patient education:  - Trigger point dry needling -  Iontophoresis   ASSESSMENT:  CLINICAL IMPRESSION: ***   DONETA ANNUNZIATA continues to demonstrate potential for improvement and would benefit from continued skilled therapy to address impairments.      OBJECTIVE IMPAIRMENTS: decreased activity tolerance, decreased ROM, decreased strength, increased edema, increased muscle spasms, impaired flexibility, impaired UE functional use, postural dysfunction, and pain.   ACTIVITY LIMITATIONS: carrying, lifting, sleeping, bathing, toileting, dressing, reach over head, and hygiene/grooming  PARTICIPATION LIMITATIONS: cleaning and occupation  PERSONAL FACTORS: Profession are also affecting patient's functional outcome.   REHAB POTENTIAL: Excellent  CLINICAL DECISION MAKING: Evolving/moderate complexity  EVALUATION COMPLEXITY: Low   GOALS: Goals reviewed with patient? Yes  SHORT TERM GOALS: Target date: 09/25/2023   Patient will be independent with initial HEP.  Baseline: no HEP Goal status: MET 09/17/23  2.  Decreased neck and shoulder pain by 50%  Baseline: 8/10 Goal status: IN PROGRESS 09/17/2023- 2/10 pain  3.  No UE numbness and tingling Baseline: intermittent N/T L arm into fingers Goal status: IN PROGRESS 09/17/23- no report of tingling today   LONG TERM GOALS: Target date: 10/23/2023   Patient will be independent with advanced/ongoing HEP to improve outcomes and carryover.  Baseline:  initial HEP Goal status: IN PROGRESS  2.  Patient will report 85% improvement in neck and L shoulder pain to improve QOL.  Baseline: 8/10 Goal status: IN PROGRESS  3.  Improved shoulder strength to 5/5  Baseline: see objective chart Goal status: IN PROGRESS  4.  Patient to improve L shoulder and neck AROM to WNL without pain provocation to allow for increased ease of ADLs.  Baseline: see objective chart Goal status: IN PROGRESS  5.  Patient will report 70 on Quick Dash to demonstrate improved functional ability.  Baseline: 65 / 100 = 65.9 % Goal status: IN PROGRESS  6.  Patient will be able to sleep without L shoulder pain.   Baseline: hard to get to sleep due to pain Goal status: IN PROGRESS     PLAN:  PT FREQUENCY: 2x/week  PT DURATION:  12 sessions  PLANNED INTERVENTIONS: 97110-Therapeutic exercises, 97530- Therapeutic activity, 97112- Neuromuscular re-education, 97535- Self Care, 16109- Manual therapy, 97014- Electrical stimulation (unattended), 97035- Ultrasound, 60454- Ionotophoresis 4mg /ml Dexamethasone, Patient/Family education, Taping, Dry Needling, Joint mobilization, Spinal mobilization, Cryotherapy, and Moist heat  PLAN FOR NEXT SESSION: work on decreasing pain and inflammation then progress ROM and strengthening,  trial of ionto L supraspinatus, didn't like TrDN very sore afterwards   Solon Palm, PT  10/08/2023, 9:32 PM

## 2023-10-09 ENCOUNTER — Ambulatory Visit: Payer: BC Managed Care – PPO | Attending: Nurse Practitioner | Admitting: Physical Therapy

## 2023-10-17 ENCOUNTER — Ambulatory Visit: Payer: BC Managed Care – PPO

## 2023-11-06 ENCOUNTER — Other Ambulatory Visit: Payer: Self-pay | Admitting: Nurse Practitioner

## 2023-11-06 DIAGNOSIS — I10 Essential (primary) hypertension: Secondary | ICD-10-CM

## 2024-01-09 ENCOUNTER — Telehealth: Admitting: Nurse Practitioner

## 2024-01-09 ENCOUNTER — Ambulatory Visit: Payer: Self-pay | Admitting: Nurse Practitioner

## 2024-01-09 DIAGNOSIS — R059 Cough, unspecified: Secondary | ICD-10-CM | POA: Diagnosis not present

## 2024-01-09 MED ORDER — ALBUTEROL SULFATE HFA 108 (90 BASE) MCG/ACT IN AERS: 2.0000 | INHALATION_SPRAY | Freq: Four times a day (QID) | RESPIRATORY_TRACT | 0 refills | Status: DC | PRN

## 2024-01-09 MED ORDER — FLUTICASONE FUROATE-VILANTEROL 100-25 MCG/ACT IN AEPB
1.0000 | INHALATION_SPRAY | Freq: Every day | RESPIRATORY_TRACT | 3 refills | Status: DC
Start: 2024-01-09 — End: 2024-08-26

## 2024-01-09 NOTE — Telephone Encounter (Signed)
 Noted. Dm/cma

## 2024-01-09 NOTE — Assessment & Plan Note (Signed)
 Acute going on chronic Etiology unclear, she seems to be describing a type of bronchospasm. Will order stat CT scan of chest for further evaluation. Will also trial Breo to see if we can prevent bronco spasms and she will continue to use albuterol as a rescue if needed.  Patient notified to wash mouth out after use of Breo.  Will also refer patient to pulmonology for assistance with evaluation and management. Patient will be scheduled to follow-up with PCP in about 3 weeks to see if she has had any improvement in symptoms with initiation of Breo. Patient told if she continues to have prolonged episodes without symptom relief despite using Breo and albuterol especially she is heart rate dropping on Apple Watch she should consider reevaluation in the ER.  She reports her understanding.

## 2024-01-09 NOTE — Progress Notes (Signed)
 Established Patient Office Visit  An audio/visual tele-health visit was completed today for this patient. I connected with  Kevan Ny on 01/09/24 utilizing audio/visual technology and verified that I am speaking with the correct person using two identifiers. The patient was located at their home, and I was located at the office of [facility] during the encounter. I discussed the limitations of evaluation and management by telemedicine. The patient expressed understanding and agreed to proceed.    Subjective   Patient ID: Madeline Hawkins, female    DOB: 28-Nov-1965  Age: 58 y.o. MRN: [id number]  Chief Complaint  Patient presents with   Cough    Patient has today for a virtual visit for the above.  For the last 3 months she has been having these intermittent episodes where she is experiencing coughing and difficulty breathing.  Generally not experiencing wheezing.  This all started in December.  At that time she was seen in the emergency department and was diagnosed with bronchitis.  She was treated with nebulizer, inhaler, prednisone, and x-ray was completed which identified no signs of pneumonia.  She reports that these episodes have no obvious triggers, sometimes they can last for a few moments or be more prolonged.  She will usually stop rest take a drink and this will improve the symptoms.  She is not having any chest pain with the symptoms.  She is a never smoker, denies any history of COPD or asthma.  During a prolonged event she notices on her Apple Watch that her heart rate will drop below 60.    Review of Systems  Constitutional:  Negative for chills and fever.  Respiratory:  Positive for cough. Negative for sputum production, shortness of breath and wheezing.   Cardiovascular:  Negative for chest pain.  Neurological:  Positive for dizziness.      Objective:     LMP 11/10/2011    Physical Exam Comprehensive physical exam not completed today  as office visit was conducted remotely.  Overall patient appears well on video, no signs of acute respiratory distress were identified.  Patient was alert and oriented, and appeared to have appropriate judgment.   No results found for any visits on 01/09/24.    The ASCVD Risk score (Arnett DK, et al., 2019) failed to calculate for the following reasons:   The systolic blood pressure is missing    Assessment & Plan:   Problem List Items Addressed This Visit       Other   Cough - Primary   Acute going on chronic Etiology unclear, she seems to be describing a type of bronchospasm. Will order stat CT scan of chest for further evaluation. Will also trial Breo to see if we can prevent bronco spasms and she will continue to use albuterol as a rescue if needed.  Patient notified to wash mouth out after use of Breo.  Will also refer patient to pulmonology for assistance with evaluation and management. Patient will be scheduled to follow-up with PCP in about 3 weeks to see if she has had any improvement in symptoms with initiation of Breo. Patient told if she continues to have prolonged episodes without symptom relief despite using Breo and albuterol especially she is heart rate dropping on Apple Watch she should consider reevaluation in the ER.  She reports her understanding.      Relevant Medications   fluticasone furoate-vilanterol (BREO ELLIPTA) 100-25 MCG/ACT AEPB   albuterol (VENTOLIN HFA) 108 (90  Base) MCG/ACT inhaler   Other Relevant Orders   CT Chest Wo Contrast   Ambulatory referral to Pulmonology    Return in about 3 weeks (around 01/30/2024) for F/U with PCP.    Elenore Paddy, NP

## 2024-01-09 NOTE — Telephone Encounter (Signed)
  Chief Complaint: breathing difficulty Symptoms: shortness of breath, squeezing sensation Frequency: ongoing for a few months intermittently Pertinent Negatives: Patient denies Chest pain Disposition: [] ED /[] Urgent Care (no appt availability in office) / [x] Appointment(In office/virtual)/ []  [facility]/ [] Home Care/ [] Refused Recommended Disposition /[] [facility]/ []  Follow-up with PCP Additional Notes:  Madeline Hawkins calling in requesting office visit for intermittent shortness of breath that has been ongoing for a few months. She does not have any other symptoms. Nothing seems to trigger her breathing difficulties. She has not tried any treatments and has not been evaluated for this. Evaluation advised, patient is unable to make appointment in person today due to work schedule, accepted virtual visit this evening and she is aware she may need a secondary in person visit based on virtual visit evaluation today. She is not currently experiencing breathing difficulty and is comfortable at work today. Care advised given per protocol. Discussed reasons to seek EMS or call back for more questions.       Copied from CRM [id number]. Topic: Clinical - Red Word Triage >> Jan 09, 2024 10:58 AM Fredrich Romans wrote: Red Word that prompted transfer to Nurse Triage: breathing issues,hard time catching breathe Reason for Disposition  [1] MODERATE longstanding difficulty breathing (e.g., speaks in phrases, SOB even at rest, pulse 100-120) AND [2] SAME as normal  Answer Assessment - Initial Assessment Questions 1. RESPIRATORY STATUS: "Describe your breathing?" (e.g., wheezing, shortness of breath, unable to speak, severe coughing)      Hard time catching breath 2. ONSET: "When did this breathing problem begin?"      Ongoing intermittent for a few months  3. PATTERN "Does the difficult breathing come and go, or has it been constant since it started?"      Intermittent 4. SEVERITY: "How bad is  your breathing?" (e.g., mild, moderate, severe)    - MILD: No SOB at rest, mild SOB with walking, speaks normally in sentences, can lie down, no retractions, pulse < 100.    - MODERATE: SOB at rest, SOB with minimal exertion and prefers to sit, cannot lie down flat, speaks in phrases, mild retractions, audible wheezing, pulse 100-120.    - SEVERE: Very SOB at rest, speaks in single words, struggling to breathe, sitting hunched forward, retractions, pulse > 120      Mild 5. RECURRENT SYMPTOM: "Have you had difficulty breathing before?" If Yes, ask: "When was the last time?" and "What happened that time?"      Ongoing for a few months 6. CARDIAC HISTORY: "Do you have any history of heart disease?" (e.g., heart attack, angina, bypass surgery, angioplasty)      Denies 7. LUNG HISTORY: "Do you have any history of lung disease?"  (e.g., pulmonary embolus, asthma, emphysema)     Denies 8. CAUSE: "What do you think is causing the breathing problem?"      Unsure 9. OTHER SYMPTOMS: "Do you have any other symptoms? (e.g., dizziness, runny nose, cough, chest pain, fever)     Denies  Protocols used: Breathing Difficulty-A-AH

## 2024-01-10 ENCOUNTER — Encounter: Payer: Self-pay | Admitting: Nurse Practitioner

## 2024-01-10 ENCOUNTER — Ambulatory Visit
Admission: RE | Admit: 2024-01-10 | Discharge: 2024-01-10 | Disposition: A | Discharge status: Home / Self Care | Source: Ambulatory Visit | Attending: Nurse Practitioner | Admitting: Nurse Practitioner

## 2024-01-10 DIAGNOSIS — R059 Cough, unspecified: Secondary | ICD-10-CM

## 2024-01-15 ENCOUNTER — Encounter: Payer: Self-pay | Admitting: Nurse Practitioner

## 2024-01-16 ENCOUNTER — Other Ambulatory Visit

## 2024-02-04 ENCOUNTER — Ambulatory Visit (INDEPENDENT_AMBULATORY_CARE_PROVIDER_SITE_OTHER): Admitting: Nurse Practitioner

## 2024-02-04 ENCOUNTER — Encounter: Payer: Self-pay | Admitting: Nurse Practitioner

## 2024-02-04 VITALS — BP 132/80 | HR 76 | Temp 98.0°F | Ht 63.0 in | Wt 202.2 lb

## 2024-02-04 DIAGNOSIS — I3139 Other pericardial effusion (noninflammatory): Secondary | ICD-10-CM | POA: Diagnosis not present

## 2024-02-04 DIAGNOSIS — R739 Hyperglycemia, unspecified: Secondary | ICD-10-CM

## 2024-02-04 DIAGNOSIS — R053 Chronic cough: Secondary | ICD-10-CM

## 2024-02-04 DIAGNOSIS — E782 Mixed hyperlipidemia: Secondary | ICD-10-CM

## 2024-02-04 LAB — TSH: TSH: 2.34 u[IU]/mL (ref 0.35–5.50)

## 2024-02-04 LAB — LIPID PANEL
Cholesterol: 188 mg/dL (ref 0–200)
HDL: 54.1 mg/dL (ref >39.00)
LDL Cholesterol: 118 mg/dL — ABNORMAL HIGH (ref 0–99)
NonHDL: 133.87
Total CHOL/HDL Ratio: 3
Triglycerides: 78 mg/dL (ref 0.0–149.0)
VLDL: 15.6 mg/dL (ref 0.0–40.0)

## 2024-02-04 LAB — RENAL FUNCTION PANEL
Albumin: 4.7 g/dL (ref 3.5–5.2)
BUN: 13 mg/dL (ref 6–23)
CO2: 31 meq/L (ref 19–32)
Calcium: 9.7 mg/dL (ref 8.4–10.5)
Chloride: 101 meq/L (ref 96–112)
Creatinine, Ser: 0.86 mg/dL (ref 0.40–1.20)
GFR: 75.96 mL/min (ref >60.00)
Glucose, Bld: 78 mg/dL (ref 70–99)
Phosphorus: 3.9 mg/dL (ref 2.3–4.6)
Potassium: 3.9 meq/L (ref 3.5–5.1)
Sodium: 141 meq/L (ref 135–145)

## 2024-02-04 LAB — SEDIMENTATION RATE: Sed Rate: 17 mm/h (ref 0–30)

## 2024-02-04 LAB — C-REACTIVE PROTEIN: CRP: <1 mg/dL (ref 0.5–20.0)

## 2024-02-04 LAB — URIC ACID: Uric Acid, Serum: 4.4 mg/dL (ref 2.4–7.0)

## 2024-02-04 LAB — HEMOGLOBIN A1C: Hgb A1c MFr Bld: 5.9 % (ref 4.6–6.5)

## 2024-02-04 NOTE — Progress Notes (Signed)
 Established Patient Visit  Patient: Madeline Hawkins   DOB: [birth year]   58 y.o. Female  MRN: [id number] Visit Date: 02/04/2024  Subjective:    Chief Complaint  Patient presents with   Follow-up   HPI Cough Persistent dry cough, associated with chest tightness and SOB with exertion. Current use of breo daily and albuterol >2x/day- no improvement No previous tobacco use or second hand exposure. Hx of GERD but symptoms controlled with use of PPI. No hx of anemia. CT chest 12/2023: No acute cardiopulmonary findings. Trace pericardial effusion. Aortic atherosclerosis   She has Appointment pulmonology on 03/11/2024 PFT completed today: FEV1/FVC 0.82, FVC1.56 Possibly restrictive lung disease? Check THYROID, uric acid, ANA, ESR, CRP today Also ordered echocardiogram   Hyperglycemia Repeat hgbA1c Advised to maintain a low fat/low carb/low sugar diet   Reviewed medical, surgical, and social history today  Medications: Outpatient Medications Prior to Visit  Medication Sig   albuterol (VENTOLIN HFA) 108 (90 Base) MCG/ACT inhaler Inhale 2 puffs into the lungs every 6 (six) hours as needed for wheezing or shortness of breath.   fluticasone furoate-vilanterol (BREO ELLIPTA) 100-25 MCG/ACT AEPB Inhale 1 puff into the lungs daily.   hydrochlorothiazide (HYDRODIURIL) 12.5 MG tablet TAKE 1 TABLET BY MOUTH EVERY DAY   methocarbamol (ROBAXIN) 500 MG tablet Take 1 tablet (500 mg total) by mouth every 8 (eight) hours as needed for muscle spasms.   Multiple Vitamins-Minerals (WOMENS MULTIVITAMIN PLUS) TABS Take by mouth daily.   pantoprazole (PROTONIX) 20 MG tablet Take 1 tablet (20 mg total) by mouth daily.   potassium chloride SA (KLOR-CON M20) 20 MEQ tablet Take 1 tablet (20 mEq total) by mouth daily. Need office visit for additional refills   rosuvastatin (CRESTOR) 20 MG tablet TAKE 1 TABLET BY MOUTH EVERY DAY   venlafaxine XR (EFFEXOR XR) 37.5 MG 24 hr capsule Take 1 capsule  (37.5 mg total) by mouth daily with breakfast.   fluticasone (FLONASE) 50 MCG/ACT nasal spray Place 2 sprays into both nostrils daily. (Patient not taking: Reported on 02/04/2024)   [DISCONTINUED] naproxen (NAPROSYN) 500 MG tablet Take 1 tablet (500 mg total) by mouth 2 (two) times daily with a meal. (Patient not taking: Reported on 02/04/2024)   [DISCONTINUED] ondansetron (ZOFRAN-ODT) 4 MG disintegrating tablet Take 1 tablet (4 mg total) by mouth every 8 (eight) hours as needed for nausea or vomiting. (Patient not taking: Reported on 02/04/2024)   No facility-administered medications prior to visit.   Reviewed past medical and social history.   ROS per HPI above      Objective:  BP 132/80 (BP Location: Left Arm, Patient Position: Sitting, Cuff Size: Large)   Pulse 76   Temp 98 F (36.7 C) (Temporal)   Ht 5\' 3"  (1.6 m)   Wt 202 lb 3.2 oz (91.7 kg)   LMP 11/10/2011   SpO2 99%   BMI 35.82 kg/m      Physical Exam Vitals and nursing note reviewed.  Cardiovascular:     Rate and Rhythm: Normal rate and regular rhythm.     Pulses: Normal pulses.     Heart sounds: Normal heart sounds.  Pulmonary:     Effort: Pulmonary effort is normal.     Breath sounds: Normal breath sounds.  Musculoskeletal:     Right lower leg: Edema present.     Left lower leg: Edema present.  Neurological:     Mental Status: She  is alert and oriented to person, place, and time.  Psychiatric:        Mood and Affect: Mood normal.        Behavior: Behavior normal.        Thought Content: Thought content normal.     No results found for any visits on 02/04/24.    Assessment & Plan:    Problem List Items Addressed This Visit     Cough   Persistent dry cough, associated with chest tightness and SOB with exertion. Current use of breo daily and albuterol >2x/day- no improvement No previous tobacco use or second hand exposure. Hx of GERD but symptoms controlled with use of PPI. No hx of anemia. CT chest  12/2023: No acute cardiopulmonary findings. Trace pericardial effusion. Aortic atherosclerosis   She has Appointment pulmonology on 03/11/2024 PFT completed today: FEV1/FVC 0.82, FVC1.56 Possibly restrictive lung disease? Check THYROID, uric acid, ANA, ESR, CRP today Also ordered echocardiogram       Hyperglycemia   Repeat hgbA1c Advised to maintain a low fat/low carb/low sugar diet      Relevant Orders   Hemoglobin A1c   Hyperlipidemia - Primary   Relevant Orders   Lipid panel   Other Visit Diagnoses       Pericardial effusion       Relevant Orders   Renal Function Panel   ECHOCARDIOGRAM COMPLETE   Sedimentation rate   C-reactive protein   Uric acid   ANA w/Reflex   TSH      Return in about 3 months (around 05/05/2024) for CPE (fasting).     [name], NP

## 2024-02-04 NOTE — Assessment & Plan Note (Signed)
 Persistent dry cough, associated with chest tightness and SOB with exertion. Current use of breo daily and albuterol >2x/day- no improvement No previous tobacco use or second hand exposure. Hx of GERD but symptoms controlled with use of PPI. No hx of anemia. CT chest 12/2023: No acute cardiopulmonary findings. Trace pericardial effusion. Aortic atherosclerosis   She has Appointment pulmonology on 03/11/2024 PFT completed today: FEV1/FVC 0.82, FVC1.56 Possibly restrictive lung disease? Check THYROID, uric acid, ANA, ESR, CRP today Also ordered echocardiogram

## 2024-02-04 NOTE — Patient Instructions (Addendum)
 Go to lab Maintain appointment with pulmonology  Diabetes Mellitus and Nutrition, Adult When you have diabetes, or diabetes mellitus, it is very important to have healthy eating habits because your blood sugar (glucose) levels are greatly affected by what you eat and drink. Eating healthy foods in the right amounts, at about the same times every day, can help you: Manage your blood glucose. Lower your risk of heart disease. Improve your blood pressure. Reach or maintain a healthy weight. What can affect my meal plan? Every person with diabetes is different, and each person has different needs for a meal plan. Your health care provider may recommend that you work with a dietitian to make a meal plan that is best for you. Your meal plan may vary depending on factors such as: The calories you need. The medicines you take. Your weight. Your blood glucose, blood pressure, and cholesterol levels. Your activity level. Other health conditions you have, such as heart or kidney disease. How do carbohydrates affect me? Carbohydrates, also called carbs, affect your blood glucose level more than any other type of food. Eating carbs raises the amount of glucose in your blood. It is important to know how many carbs you can safely have in each meal. This is different for every person. Your dietitian can help you calculate how many carbs you should have at each meal and for each snack. How does alcohol affect me? Alcohol can cause a decrease in blood glucose (hypoglycemia), especially if you use insulin or take certain diabetes medicines by mouth. Hypoglycemia can be a life-threatening condition. Symptoms of hypoglycemia, such as sleepiness, dizziness, and confusion, are similar to symptoms of having too much alcohol. Do not drink alcohol if: Your health care provider tells you not to drink. You are pregnant, may be pregnant, or are planning to become pregnant. If you drink alcohol: Limit how much you have  to: 0-1 drink a day for women. 0-2 drinks a day for men. Know how much alcohol is in your drink. In the [place] one drink equals one 12 oz bottle of beer (355 mL), one 5 oz glass of wine (148 mL), or one 1 oz glass of hard liquor (44 mL). Keep yourself hydrated with water, diet soda, or unsweetened iced tea. Keep in mind that regular soda, juice, and other mixers may contain a lot of sugar and must be counted as carbs. What are tips for following this plan?  Reading food labels Start by checking the serving size on the Nutrition Facts label of packaged foods and drinks. The number of calories and the amount of carbs, fats, and other nutrients listed on the label are based on one serving of the item. Many items contain more than one serving per package. Check the total grams (g) of carbs in one serving. Check the number of grams of saturated fats and trans fats in one serving. Choose foods that have a low amount or none of these fats. Check the number of milligrams (mg) of salt (sodium) in one serving. Most people should limit total sodium intake to less than 2,300 mg per day. Always check the nutrition information of foods labeled as "low-fat" or "nonfat." These foods may be higher in added sugar or refined carbs and should be avoided. Talk to your dietitian to identify your daily goals for nutrients listed on the label. Shopping Avoid buying canned, pre-made, or processed foods. These foods tend to be high in fat, sodium, and added sugar. Shop around the outside edge  of the grocery store. This is where you will most often find fresh fruits and vegetables, bulk grains, fresh meats, and fresh dairy products. Cooking Use low-heat cooking methods, such as baking, instead of high-heat cooking methods, such as deep frying. Cook using healthy oils, such as olive, canola, or sunflower oil. Avoid cooking with butter, cream, or high-fat meats. Meal planning Eat meals and snacks regularly, preferably at  the same times every day. Avoid going long periods of time without eating. Eat foods that are high in fiber, such as fresh fruits, vegetables, beans, and whole grains. Eat 4-6 oz (112-168 g) of lean protein each day, such as lean meat, chicken, fish, eggs, or tofu. One ounce (oz) (28 g) of lean protein is equal to: 1 oz (28 g) of meat, chicken, or fish. 1 egg.  cup (62 g) of tofu. Eat some foods each day that contain healthy fats, such as avocado, nuts, seeds, and fish. What foods should I eat? Fruits Berries. Apples. Oranges. Peaches. Apricots. Plums. Grapes. Mangoes. Papayas. Pomegranates. Kiwi. Cherries. Vegetables Leafy greens, including lettuce, spinach, kale, chard, collard greens, mustard greens, and cabbage. Beets. Cauliflower. Broccoli. Carrots. Green beans. Tomatoes. Peppers. Onions. Cucumbers. Brussels sprouts. Grains Whole grains, such as whole-wheat or whole-grain bread, crackers, tortillas, cereal, and pasta. Unsweetened oatmeal. Quinoa. Brown or wild rice. Meats and other proteins Seafood. Poultry without skin. Lean cuts of poultry and beef. Tofu. Nuts. Seeds. Dairy Low-fat or fat-free dairy products such as milk, yogurt, and cheese. The items listed above may not be a complete list of foods and beverages you can eat and drink. Contact a dietitian for more information. What foods should I avoid? Fruits Fruits canned with syrup. Vegetables Canned vegetables. Frozen vegetables with butter or cream sauce. Grains Refined white flour and flour products such as bread, pasta, snack foods, and cereals. Avoid all processed foods. Meats and other proteins Fatty cuts of meat. Poultry with skin. Breaded or fried meats. Processed meat. Avoid saturated fats. Dairy Full-fat yogurt, cheese, or milk. Beverages Sweetened drinks, such as soda or iced tea. The items listed above may not be a complete list of foods and beverages you should avoid. Contact a dietitian for more  information. Questions to ask a health care provider Do I need to meet with a certified diabetes care and education specialist? Do I need to meet with a dietitian? What number can I call if I have questions? When are the best times to check my blood glucose? Where to find more information: American Diabetes Association: diabetes.org Academy of Nutrition and Dietetics: eatright.[employer] of Diabetes and Digestive and Kidney Diseases: StageSync.si Association of Diabetes Care & Education Specialists: diabeteseducator.org Summary It is important to have healthy eating habits because your blood sugar (glucose) levels are greatly affected by what you eat and drink. It is important to use alcohol carefully. A healthy meal plan will help you manage your blood glucose and lower your risk of heart disease. Your health care provider may recommend that you work with a dietitian to make a meal plan that is best for you. This information is not intended to replace advice given to you by your health care provider. Make sure you discuss any questions you have with your health care provider. Document Revised: 05/17/2020 Document Reviewed: 05/18/2020 Elsevier Patient Education  2024 [employer].

## 2024-02-04 NOTE — Assessment & Plan Note (Signed)
 Repeat hgbA1c Advised to maintain a low fat/low carb/low sugar diet

## 2024-02-05 LAB — ANA W/REFLEX: Anti Nuclear Antibody (ANA): NEGATIVE

## 2024-02-06 ENCOUNTER — Other Ambulatory Visit: Payer: Self-pay | Admitting: Nurse Practitioner

## 2024-02-06 ENCOUNTER — Encounter: Payer: Self-pay | Admitting: Nurse Practitioner

## 2024-02-06 DIAGNOSIS — I10 Essential (primary) hypertension: Secondary | ICD-10-CM

## 2024-03-03 ENCOUNTER — Other Ambulatory Visit: Payer: Self-pay | Admitting: Nurse Practitioner

## 2024-03-03 DIAGNOSIS — I10 Essential (primary) hypertension: Secondary | ICD-10-CM

## 2024-03-03 MED ORDER — POTASSIUM CHLORIDE CRYS ER 20 MEQ PO TBCR: 20.0000 meq | EXTENDED_RELEASE_TABLET | Freq: Every day | ORAL | 1 refills | Status: DC

## 2024-03-03 NOTE — Telephone Encounter (Signed)
 Copied from CRM [id number]. Topic: Clinical - Medication Refill >> Mar 03, 2024  1:00 PM Clyde Darling P wrote: Most Recent Primary Care Visit:  Provider: Kandace Organ  Department: [facility]  Visit Type: OFFICE VISIT  Date: 02/04/2024  Medication: potassium chloride  SA (KLOR-CON  M20) 20 MEQ tablet  Has the patient contacted their pharmacy? Yes- nomore refills (Agent: If no, request that the patient contact the pharmacy for the refill. If patient does not wish to contact the pharmacy document the reason why and proceed with request.) (Agent: If yes, when and what did the pharmacy advise?)  Is this the correct pharmacy for this prescription? Yes If no, delete pharmacy and type the correct one.  This is the patient's preferred pharmacy:   [id number] - [place], [place] - [address] [place]  [address] Phone: [phone] Fax: [phone]   Has the prescription been filled recently? No  Is the patient out of the medication? Yes  Has the patient been seen for an appointment in the last year OR does the patient have an upcoming appointment? Yes 02/04/24  Can we respond through MyChart? Yes  Agent: Please be advised that Rx refills may take up to 3 business days. We ask that you follow-up with your pharmacy.

## 2024-03-05 ENCOUNTER — Ambulatory Visit (HOSPITAL_COMMUNITY): Attending: Cardiology

## 2024-03-05 DIAGNOSIS — I517 Cardiomegaly: Secondary | ICD-10-CM | POA: Diagnosis not present

## 2024-03-05 DIAGNOSIS — I3139 Other pericardial effusion (noninflammatory): Secondary | ICD-10-CM | POA: Diagnosis present

## 2024-03-05 LAB — ECHOCARDIOGRAM COMPLETE
Area-P 1/2: 3.95 cm2
S' Lateral: 2.8 cm

## 2024-03-11 ENCOUNTER — Encounter: Payer: Self-pay | Admitting: Pulmonary Disease

## 2024-03-11 ENCOUNTER — Ambulatory Visit: Admitting: Pulmonary Disease

## 2024-03-11 VITALS — BP 115/77 | HR 83 | Ht 63.0 in | Wt 204.2 lb

## 2024-03-11 DIAGNOSIS — R0689 Other abnormalities of breathing: Secondary | ICD-10-CM

## 2024-03-11 DIAGNOSIS — I10 Essential (primary) hypertension: Secondary | ICD-10-CM

## 2024-03-11 DIAGNOSIS — R062 Wheezing: Secondary | ICD-10-CM | POA: Diagnosis not present

## 2024-03-11 DIAGNOSIS — K219 Gastro-esophageal reflux disease without esophagitis: Secondary | ICD-10-CM

## 2024-03-11 DIAGNOSIS — R0600 Dyspnea, unspecified: Secondary | ICD-10-CM

## 2024-03-11 LAB — CBC WITH DIFFERENTIAL/PLATELET
Basophils Absolute: 0 10*3/uL (ref 0.0–0.1)
Basophils Relative: 0.6 % (ref 0.0–3.0)
Eosinophils Absolute: 0.1 10*3/uL (ref 0.0–0.7)
Eosinophils Relative: 1.2 % (ref 0.0–5.0)
HCT: 39 % (ref 36.0–46.0)
Hemoglobin: 12.7 g/dL (ref 12.0–15.0)
Lymphocytes Relative: 45 % (ref 12.0–46.0)
Lymphs Abs: 2.6 10*3/uL (ref 0.7–4.0)
MCHC: 32.6 g/dL (ref 30.0–36.0)
MCV: 85.3 fl (ref 78.0–100.0)
Monocytes Absolute: 0.6 10*3/uL (ref 0.1–1.0)
Monocytes Relative: 10.2 % (ref 3.0–12.0)
Neutro Abs: 2.5 10*3/uL (ref 1.4–7.7)
Neutrophils Relative %: 43 % (ref 43.0–77.0)
Platelets: 213 10*3/uL (ref 150.0–400.0)
RBC: 4.57 Mil/uL (ref 3.87–5.11)
RDW: 16 % — ABNORMAL HIGH (ref 11.5–15.5)
WBC: 5.8 10*3/uL (ref 4.0–10.5)

## 2024-03-11 NOTE — Progress Notes (Signed)
 Madeline Hawkins    [id number]    08-17-1966  Primary Care Physician:Nche, Connye Delaine, NP  Referring Physician: Zorita Hiss, NP [address] [place],  [place] [address]  Chief complaint: Consult for chest tightness  HPI: 58 y.o. who  has a past medical history of Adjustment disorder with depressed mood (08/05/2015), Anxiety, Bartholin gland cyst, CIN I (cervical intraepithelial neoplasia I) (1989), GERD (gastroesophageal reflux disease), Hyperlipidemia, Hypertension, Intertrigo (04/13/2021), and Lower abdominal pain (12/19/2022).  Discussed the use of AI scribe software for clinical note transcription with the patient, who gave verbal consent to proceed.  History of Present Illness Madeline Hawkins is a 58 year old female who presents with lung symptoms including chest tightness and shortness of breath.  She has been experiencing a squeezing tightness in her chest that comes and goes, lasting for a few hours at a time, particularly while at work. This has been ongoing for three months. The symptoms sometimes subside but can recur later in the day. The tightness is accompanied by shortness of breath and occasional wheezing. No specific triggers such as cold air or strong perfumes are identified.  She has been using Ventolin  and Breo inhalers for the past two months, which provide some relief. Breo is used daily and Ventolin  as needed. She has no history of asthma or other lung issues and has never smoked.  Her past medical history includes prediabetes, hypertension, and hyperlipidemia. She manages her blood pressure with medication, which she states keeps it under control.   A CT scan was performed two months ago and an echocardiogram was done last week. A previous lung function test indicated possible restriction, but she has not been informed of the specific results.  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 Exposures: No mold, hot tub, Jacuzzi.  No feather pillows or comforter No h/o chemo/XRT/amiodarone/macrodantin /MTX  No exposure to asbestos, silica or other organic allergens  Smoking history: Never smoker Travel history: No significant travel history Relevant family history: No family history of lung disease   Outpatient Encounter Medications as of 03/11/2024  Medication Sig   albuterol  (VENTOLIN  HFA) 108 (90 Base) MCG/ACT inhaler Inhale 2 puffs into the lungs every 6 (six) hours as needed for wheezing or shortness of breath.   fluticasone  (FLONASE ) 50 MCG/ACT nasal spray Place 2 sprays into both nostrils daily. (Patient taking differently: Place 2 sprays into both nostrils daily. prn)   fluticasone  furoate-vilanterol (BREO ELLIPTA ) 100-25 MCG/ACT AEPB Inhale 1 puff into the lungs daily.   hydrochlorothiazide  (HYDRODIURIL ) 12.5 MG tablet TAKE 1 TABLET BY MOUTH EVERY DAY   methocarbamol  (ROBAXIN ) 500 MG tablet Take 1 tablet (500 mg total) by mouth every 8 (eight) hours as needed for muscle spasms.   Multiple Vitamins-Minerals (WOMENS MULTIVITAMIN PLUS) TABS Take by mouth daily.   pantoprazole  (PROTONIX ) 20 MG tablet Take 1 tablet (20 mg total) by mouth daily.   potassium chloride  SA (KLOR-CON  M20) 20 MEQ tablet Take 1 tablet (20 mEq total) by mouth daily.   rosuvastatin  (CRESTOR ) 20 MG tablet TAKE 1 TABLET BY MOUTH EVERY DAY   venlafaxine  XR (EFFEXOR  XR) 37.5 MG 24 hr capsule Take 1 capsule (37.5 mg total) by mouth daily with breakfast.   No facility-administered encounter medications on file as of 03/11/2024.    Allergies as  of 03/11/2024   (No Known Allergies)    Past Medical History:  Diagnosis Date   Adjustment disorder with depressed mood 08/05/2015   Anxiety    Bartholin gland cyst    CIN I (cervical intraepithelial neoplasia I) 1989   cryo...also in 2008 .. had  cryo    GERD (gastroesophageal reflux disease)    Hyperlipidemia    Hypertension    Intertrigo 04/13/2021   Lower abdominal pain 12/19/2022    Past Surgical History:  Procedure Laterality Date   ABDOMINAL HYSTERECTOMY  11/22/2011   Procedure: HYSTERECTOMY ABDOMINAL;  Surgeon: Davia Erps, MD;  Location: [facility];  Service: Gynecology;  Laterality: N/A;   BARTHOLIN CYST MARSUPIALIZATION Left 12/02/2018   Procedure: BARTHOLIN MARSUPIALIZATION OF LEFT GLAND;  Surgeon: Lavoie, Marie-Lyne, MD;  Location: [facility];  Service: Gynecology;  Laterality: Left;   colon polyps removed     GYNECOLOGIC CRYOSURGERY  07/07/2007   LAPAROSCOPIC LYSIS OF ADHESIONS  06/28/2015   Procedure: LAPAROSCOPIC LYSIS OF ABDOMINAL PELVIC ADHESIONS;  Surgeon: Davia Erps, MD;  Location: [facility];  Service: Gynecology;;   LAPAROSCOPIC UNILATERAL SALPINGO OOPHERECTOMY Right 06/28/2015   Procedure: LAPAROSCOPIC RIGHT SALPINGO OOPHORECTOMY;  Surgeon: Davia Erps, MD;  Location: [facility];  Service: Gynecology;  Laterality: Right;   LAPAROSCOPIC UNILATERAL SALPINGO OOPHERECTOMY Left 10/11/2016   Procedure: LAPAROSCOPIC UNILATERAL OOPHORECTOMY with pelvic washings;  Surgeon: Davia Erps, MD;  Location: [facility];  Service: Gynecology;  Laterality: Left;  HARMONIC SCAPEL   LAPAROSCOPY  11/22/2011   Procedure: LAPAROSCOPY DIAGNOSTIC;  Surgeon: Davia Erps, MD;  Location: [facility];  Service: Gynecology;  Laterality: N/A;   OVARIAN CYST REMOVAL  11/22/2011   Procedure: OVARIAN CYSTECTOMY;  Surgeon: Davia Erps, MD;  Location: [facility];  Service: Gynecology;  Laterality: Left;   SALPINGECTOMY     left for ectopic pregnancy   TUBAL LIGATION     right tubal ligation    Family History  Problem Relation Age of Onset   Diabetes Brother    Ovarian cancer Mother    Ovarian cancer Paternal Aunt    Heart disease Maternal Grandmother    Breast cancer Maternal Grandmother        Age 110's   Breast cancer  Maternal Aunt        Age 43's    Social History   Socioeconomic History   Marital status: Married    Spouse name: Not on file   Number of children: Not on file   Years of education: Not on file   Highest education level: Not on file  Occupational History   Not on file  Tobacco Use   Smoking status: Never    Passive exposure: Past   Smokeless tobacco: Never  Vaping Use   Vaping status: Never Used  Substance and Sexual Activity   Alcohol use: No    Alcohol/week: 0.0 standard drinks of alcohol   Drug use: No   Sexual activity: Yes    Partners: Male    Birth control/protection: Surgical    Comment: 1st intercourse- 19, partners- 4, married- 12 yrs   Other Topics Concern   Not on file  Social History Narrative   Not on file   Social Drivers of Health   Financial Resource Strain: Not on file  Food Insecurity: Not on file  Transportation Needs: Not on file  Physical Activity: Not on file  Stress: Not on file  Social Connections: Not on file  Intimate Partner Violence:  Not on file    Review of systems: Review of Systems  Constitutional: Negative for fever and chills.  HENT: Negative.   Eyes: Negative for blurred vision.  Respiratory: as per HPI  Cardiovascular: Negative for chest pain and palpitations.  Gastrointestinal: Negative for vomiting, diarrhea, blood per rectum. Genitourinary: Negative for dysuria, urgency, frequency and hematuria.  Musculoskeletal: Negative for myalgias, back pain and joint pain.  Skin: Negative for itching and rash.  Neurological: Negative for dizziness, tremors, focal weakness, seizures and loss of consciousness.  Endo/Heme/Allergies: Negative for environmental allergies.  Psychiatric/Behavioral: Negative for depression, suicidal ideas and hallucinations.  All other systems reviewed and are negative.  Physical Exam: Blood pressure 115/77, pulse 83, height 5\' 3"  (1.6 m), weight 204 lb 3.2 oz (92.6 kg), last menstrual period 11/10/2011,  SpO2 98%. Gen:      No acute distress HEENT:  EOMI, sclera anicteric Neck:     No masses; no thyromegaly Lungs:    Clear to auscultation bilaterally; normal respiratory effort CV:         Regular rate and rhythm; no murmurs Abd:      + bowel sounds; soft, non-tender; no palpable masses, no distension Ext:    No edema; adequate peripheral perfusion Skin:      Warm and dry; no rash Neuro: alert and oriented x 3 Psych: normal mood and affect  Data Reviewed: Imaging: CT chest 01/13/2024-lungs are clear with no parenchymal lung issue.  No pleural effusion or pneumothorax, trace pericardial effusion I have reviewed the images personally  PFTs: Spirometry report from primary care  02/04/2024 FEV1/FVC 0.82, FVC1.56 Possibly restrictive lung disease  FENO 03/11/2024-unable to complete  Labs: CBC with differential 10/08/2023-WBC 5.3, eos 3%, absolute eosinophil count 159 ANA with reflex 02/04/2024- negative Sed rate, CRP 02/04/2024- normal  Cardiac: Echocardiogram 03/05/2024 LVEF 55-60%, mild LVH.  Normal RV systolic size and function, trivial TR, trivial MR.  No evidence of pericardial effusion Assessment & Plan Asthma Intermittent chest tightness and wheezing for three months, suggestive of asthma. Symptoms improve with Breo and Ventolin  inhalers. CT scan and echocardiogram are normal, ruling out interstitial lung disease and significant cardiac issues. Differential includes asthma due to symptomatology and response to treatment. No history of smoking or significant environmental exposures. Spirometry suggests possible restriction, but CT scan is normal. Plan to further evaluate with full pulmonary function test.  She was unable to complete FENO test today - Continue Breo daily and Ventolin  as needed. - Order full pulmonary function test to assess for lung function and restriction.   Hypertension Hypertension with mild left ventricular hypertrophy on echocardiogram, likely due to long-standing  hypertension. Blood pressure is well-controlled with current medication regimen.  Gastroesophageal reflux disease (GERD) Continue PPI.  Symptoms appear well-controlled.  Recommendations: Continue Breo, Ventolin  Check full PFTs  Phyllis Breeze MD [facility] 03/11/2024, 1:19 PM  CC: Zorita Hiss, NP

## 2024-03-11 NOTE — Patient Instructions (Addendum)
 VISIT SUMMARY:  You came in today because you have been experiencing chest tightness and shortness of breath for the past three months. These symptoms occur mainly while you are at work and are sometimes accompanied by wheezing. You have been using Breo and Ventolin  inhalers, which provide some relief. A CT scan and echocardiogram were performed recently and showed normal results. Your past medical history includes prediabetes, hypertension, and hyperlipidemia.  YOUR PLAN:  -ASTHMA: Asthma is a condition where your airways narrow and swell, which can make breathing difficult. Your symptoms of chest tightness and wheezing suggest asthma, especially since they improve with the use of Breo and Ventolin  inhalers. We will continue your current inhaler regimen and order a full pulmonary function test and a FeNO test to further evaluate your lung function and check for inflammation.  -GASTROESOPHAGEAL REFLUX DISEASE (GERD): GERD is a condition where stomach acid frequently flows back into the tube connecting your mouth and stomach, causing irritation.  Continue Protonix   INSTRUCTIONS:  Please continue using your Breo inhaler daily and your Ventolin  inhaler as needed. We have ordered a full pulmonary function test and a FeNO test to further evaluate your lung function and check for inflammation. Follow up with us  once these tests are completed.

## 2024-03-12 ENCOUNTER — Ambulatory Visit: Payer: Self-pay | Admitting: Nurse Practitioner

## 2024-03-12 LAB — IGE: IgE (Immunoglobulin E), Serum: 15 kU/L (ref <=114)

## 2024-03-16 ENCOUNTER — Ambulatory Visit: Payer: Self-pay | Admitting: Pulmonary Disease

## 2024-03-27 ENCOUNTER — Ambulatory Visit
Admission: RE | Admit: 2024-03-27 | Discharge: 2024-03-27 | Disposition: A | Discharge status: Home / Self Care | Payer: BC Managed Care – PPO | Source: Ambulatory Visit | Attending: Nurse Practitioner | Admitting: Nurse Practitioner

## 2024-03-27 DIAGNOSIS — Z780 Asymptomatic menopausal state: Secondary | ICD-10-CM

## 2024-03-30 ENCOUNTER — Ambulatory Visit: Payer: Self-pay | Admitting: Nurse Practitioner

## 2024-05-26 ENCOUNTER — Other Ambulatory Visit: Payer: Self-pay | Admitting: Nurse Practitioner

## 2024-05-26 DIAGNOSIS — I10 Essential (primary) hypertension: Secondary | ICD-10-CM

## 2024-05-26 MED ORDER — HYDROCHLOROTHIAZIDE 12.5 MG PO TABS: 12.5000 mg | ORAL_TABLET | Freq: Every day | ORAL | 0 refills | Status: DC

## 2024-05-26 NOTE — Telephone Encounter (Signed)
 Copied from CRM #[id number]. Topic: Clinical - Medication Refill >> May 26, 2024  1:23 PM Deaijah H wrote: Medication: hydrochlorothiazide  (HYDRODIURIL ) 12.5 MG tablet     Has the patient contacted their pharmacy? Yes (Agent: If no, request that the patient contact the pharmacy for the refill. If patient does not wish to contact the pharmacy document the reason why and proceed with request.) (Agent: If yes, when and what did the pharmacy advise?)  This is the patient's preferred pharmacy:    [id number] [place], [place] - [address] [place] [address] Phone: [phone] Fax: [phone]  Is this the correct pharmacy for this prescription? Yes If no, delete pharmacy and type the correct one.   Has the prescription been filled recently? Yes  Is the patient out of the medication? Yes  Has the patient been seen for an appointment in the last year OR does the patient have an upcoming appointment? Yes  Can we respond through MyChart? Yes  Agent: Please be advised that Rx refills may take up to 3 business days. We ask that you follow-up with your pharmacy.

## 2024-06-15 ENCOUNTER — Other Ambulatory Visit: Payer: Self-pay | Admitting: Nurse Practitioner

## 2024-06-15 DIAGNOSIS — E785 Hyperlipidemia, unspecified: Secondary | ICD-10-CM

## 2024-06-15 DIAGNOSIS — K219 Gastro-esophageal reflux disease without esophagitis: Secondary | ICD-10-CM

## 2024-06-15 NOTE — Telephone Encounter (Signed)
 Copied from CRM #[id number]. Topic: Clinical - Medication Refill >> Jun 15, 2024 10:50 AM Rea C wrote: Medication: rosuvastatin  (CRESTOR ) 20 MG tablet, pantoprazole  (PROTONIX ) 20 MG tablet  Has the patient contacted their pharmacy? [facility] and needs refills on these two pills.   This is the patient's preferred pharmacy:  [id number] [place], [place] - [address] [place] [address] Phone: [phone] Fax: [phone]  Is this the correct pharmacy for this prescription? Yes If no, delete pharmacy and type the correct one.   Has the prescription been filled recently? No   Is the patient out of the medication? Yes  Has the patient been seen for an appointment in the last year OR does the patient have an upcoming appointment? Yes  Can we respond through MyChart? Yes  Agent: Please be advised that Rx refills may take up to 3 business days. We ask that you follow-up with your pharmacy.

## 2024-06-16 MED ORDER — PANTOPRAZOLE SODIUM 20 MG PO TBEC: 20.0000 mg | DELAYED_RELEASE_TABLET | Freq: Every day | ORAL | 0 refills | Status: DC

## 2024-06-16 MED ORDER — ROSUVASTATIN CALCIUM 20 MG PO TABS: 20.0000 mg | ORAL_TABLET | Freq: Every day | ORAL | 0 refills | Status: DC

## 2024-06-22 ENCOUNTER — Ambulatory Visit (INDEPENDENT_AMBULATORY_CARE_PROVIDER_SITE_OTHER): Admitting: Pulmonary Disease

## 2024-06-22 ENCOUNTER — Encounter: Payer: Self-pay | Admitting: Pulmonary Disease

## 2024-06-22 ENCOUNTER — Ambulatory Visit: Admitting: Pulmonary Disease

## 2024-06-22 VITALS — BP 123/60 | HR 80 | Temp 97.8°F | Ht 64.0 in | Wt 204.0 lb

## 2024-06-22 DIAGNOSIS — J45909 Unspecified asthma, uncomplicated: Secondary | ICD-10-CM | POA: Diagnosis not present

## 2024-06-22 DIAGNOSIS — R0600 Dyspnea, unspecified: Secondary | ICD-10-CM

## 2024-06-22 DIAGNOSIS — R0689 Other abnormalities of breathing: Secondary | ICD-10-CM

## 2024-06-22 DIAGNOSIS — J4530 Mild persistent asthma, uncomplicated: Secondary | ICD-10-CM

## 2024-06-22 DIAGNOSIS — K219 Gastro-esophageal reflux disease without esophagitis: Secondary | ICD-10-CM | POA: Diagnosis not present

## 2024-06-22 DIAGNOSIS — R079 Chest pain, unspecified: Secondary | ICD-10-CM

## 2024-06-22 DIAGNOSIS — R0789 Other chest pain: Secondary | ICD-10-CM

## 2024-06-22 LAB — PULMONARY FUNCTION TEST
DL/VA % pred: 126 %
DL/VA: 5.39 ml/min/mmHg/L
DLCO cor % pred: 98 %
DLCO cor: 20.29 ml/min/mmHg
DLCO unc % pred: 98 %
DLCO unc: 20.29 ml/min/mmHg
FEF 25-75 Post: 2.41 L/s
FEF 25-75 Pre: 1.5 L/s
FEF2575-%Change-Post: 60 %
FEF2575-%Pred-Post: 93 %
FEF2575-%Pred-Pre: 58 %
FEV1-%Change-Post: 11 %
FEV1-%Pred-Post: 67 %
FEV1-%Pred-Pre: 60 %
FEV1-Post: 1.81 L
FEV1-Pre: 1.62 L
FEV1FVC-%Change-Post: 2 %
FEV1FVC-%Pred-Pre: 101 %
FEV6-%Change-Post: 8 %
FEV6-%Pred-Post: 65 %
FEV6-%Pred-Pre: 60 %
FEV6-Post: 2.19 L
FEV6-Pre: 2.02 L
FEV6FVC-%Pred-Post: 103 %
FEV6FVC-%Pred-Pre: 103 %
FVC-%Change-Post: 8 %
FVC-%Pred-Post: 63 %
FVC-%Pred-Pre: 58 %
FVC-Post: 2.19 L
FVC-Pre: 2.02 L
Post FEV1/FVC ratio: 83 %
Post FEV6/FVC ratio: 100 %
Pre FEV1/FVC ratio: 80 %
Pre FEV6/FVC Ratio: 100 %
RV % pred: 108 %
RV: 2.05 L
TLC % pred: 87 %
TLC: 4.4 L

## 2024-06-22 NOTE — Patient Instructions (Signed)
  VISIT SUMMARY: You came in today because of chest tightness and breathing difficulties related to your asthma. We discussed your current symptoms, your response to the Breo inhaler, and the need for further evaluation to rule out any possible heart-related causes for your chest tightness.  YOUR PLAN: -ASTHMA WITH PERSISTENT CHEST TIGHTNESS: Asthma is a condition where your airways become inflamed and narrow, making it hard to breathe. Despite using the Breo inhaler, which has helped improve your symptoms, you still experience chest tightness. We will continue with the Breo inhaler and check your blood counts. We are not starting Singulair at this time.  -EVALUATION OF CHEST TIGHTNESS FOR POSSIBLE CARDIAC ETIOLOGY: Since your chest tightness occurs even when you are not exerting yourself, we need to rule out any heart-related causes. Your previous heart ultrasound showed good heart function, but you have not yet seen a cardiologist for this issue. We will refer you to a cardiologist for further evaluation.  INSTRUCTIONS: Please continue using your Breo inhaler as prescribed. We will check your blood counts to monitor your condition. Additionally, you will be referred to a cardiologist to further investigate the cause of your chest tightness. Follow up with the cardiologist as soon as you can to ensure we address any potential heart-related issues.

## 2024-06-22 NOTE — Progress Notes (Signed)
 Full PFT performed today.

## 2024-06-22 NOTE — Patient Instructions (Signed)
 Full PFT performed today.

## 2024-06-22 NOTE — Progress Notes (Unsigned)
 Madeline Hawkins    [id number]    January 17, 1966  Primary Care Physician:Nche, Roselie Rockford, NP  Referring Physician: Darin Redmann, MD [address] [place],  [place] [address]  Chief complaint: Follow-up for mild asthma, chest tightness  HPI: 58 y.o. who  has a past medical history of Adjustment disorder with depressed mood (08/05/2015), Anxiety, Bartholin gland cyst, CIN I (cervical intraepithelial neoplasia I) (1989), GERD (gastroesophageal reflux disease), Hyperlipidemia, Hypertension, Intertrigo (04/13/2021), and Lower abdominal pain (12/19/2022).  Discussed the use of AI scribe software for clinical note transcription with the patient, who gave verbal consent to proceed.  History of Present Illness Madeline Hawkins is a 58 year old female who presents with lung symptoms including chest tightness and shortness of breath.  She has been experiencing a squeezing tightness in her chest that comes and goes, lasting for a few hours at a time, particularly while at work. This has been ongoing for three months. The symptoms sometimes subside but can recur later in the day. The tightness is accompanied by shortness of breath and occasional wheezing. No specific triggers such as cold air or strong perfumes are identified.  She has been using Ventolin  and Breo inhalers for the past two months, which provide some relief. Breo is used daily and Ventolin  as needed. She has no history of asthma or other lung issues and has never smoked.  Her past medical history includes prediabetes, hypertension, and hyperlipidemia. She manages her blood pressure with medication, which she states keeps it under control.   A CT scan was performed two months ago and an echocardiogram was done last week. A previous lung function test indicated possible restriction, but she has not been informed of the specific results.  Interim history: [name] is a 58 year old female with asthma who presents  with chest tightness and breathing difficulties.  Chest tightness and dyspnea - Chest tightness occurs with exertion and sometimes during sleep - Episodes can occur without specific triggers, including while sitting or with exhaustion - Breathing is currently stable and improved compared to initial presentation - Continues to experience a 'squeezing' sensation in the chest despite treatment  Asthma management and response to therapy - Currently using Breo inhaler, which provides symptomatic improvement - Lung function tests are normal - Lung function improved after starting inhaler therapy  Cardiac and pulmonary evaluation - CT scan of the chest is normal - Echocardiogram was previously ordered during a telemedicine visit - No evaluation by a cardiologist for chest tightness to date  Allergic history - No history of allergies  Relevant pulmonary history Pets: No pets Occupation: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 Exposures: No mold, hot tub, Jacuzzi.  No feather pillows or comforter No h/o chemo/XRT/amiodarone/macrodantin /MTX  No exposure to asbestos, silica or other organic allergens  Smoking history: Never smoker Travel history: No significant travel history Relevant family history: No family history of lung disease   Outpatient Encounter Medications as of 06/22/2024  Medication Sig   albuterol  (VENTOLIN  HFA) 108 (90 Base) MCG/ACT inhaler Inhale 2 puffs into the lungs every 6 (six) hours as needed for wheezing or shortness of breath.   fluticasone  furoate-vilanterol (BREO ELLIPTA ) 100-25 MCG/ACT AEPB Inhale 1 puff into the lungs daily.   hydrochlorothiazide  (HYDRODIURIL ) 12.5 MG tablet Take 1 tablet (12.5 mg total)  by mouth daily.   Multiple Vitamins-Minerals (WOMENS MULTIVITAMIN PLUS) TABS Take by mouth daily.   pantoprazole   (PROTONIX ) 20 MG tablet Take 1 tablet (20 mg total) by mouth daily.   potassium chloride  SA (KLOR-CON  M20) 20 MEQ tablet Take 1 tablet (20 mEq total) by mouth daily.   rosuvastatin  (CRESTOR ) 20 MG tablet Take 1 tablet (20 mg total) by mouth daily.   venlafaxine  XR (EFFEXOR  XR) 37.5 MG 24 hr capsule Take 1 capsule (37.5 mg total) by mouth daily with breakfast.   fluticasone  (FLONASE ) 50 MCG/ACT nasal spray Place 2 sprays into both nostrils daily. (Patient not taking: Reported on 06/22/2024)   methocarbamol  (ROBAXIN ) 500 MG tablet Take 1 tablet (500 mg total) by mouth every 8 (eight) hours as needed for muscle spasms. (Patient not taking: Reported on 06/22/2024)   No facility-administered encounter medications on file as of 06/22/2024.    Physical Exam: Blood pressure 115/77, pulse 83, height 5' 3 (1.6 m), weight 204 lb 3.2 oz (92.6 kg), last menstrual period 11/10/2011, SpO2 98%. Gen:      No acute distress HEENT:  EOMI, sclera anicteric Neck:     No masses; no thyromegaly Lungs:    Clear to auscultation bilaterally; normal respiratory effort CV:         Regular rate and rhythm; no murmurs Abd:      + bowel sounds; soft, non-tender; no palpable masses, no distension Ext:    No edema; adequate peripheral perfusion Skin:      Warm and dry; no rash Neuro: alert and oriented x 3 Psych: normal mood and affect  Data Reviewed: Imaging: CT chest 01/13/2024-lungs are clear with no parenchymal lung issue.  No pleural effusion or pneumothorax, trace pericardial effusion I have reviewed the images personally  PFTs: Spirometry report from primary care  02/04/2024 FEV1/FVC 0.82, FVC1.56 Possibly restrictive lung disease  06/22/2024 FVC 2.19 [63%], FEV1 1.81 [69%], F/F83, TLC 4.40 [87%], DLCO 20.29 [98%] Air trapping present. There is a borderline significant response to bronchodilator.    FENO 03/11/2024-unable to complete  Labs: CBC with differential 10/08/2023-WBC 5.3, eos 3%, absolute eosinophil  count 159 ANA with reflex 02/04/2024- negative Sed rate, CRP 02/04/2024- normal  Cardiac: Echocardiogram 03/05/2024 LVEF 55-60%, mild LVH.  Normal RV systolic size and function, trivial TR, trivial MR.  No evidence of pericardial effusion Assessment & Plan Asthma with persistent chest tightness Asthma is present with persistent chest tightness despite treatment with Breo. Lung function tests are normal but show mild changes consistent with asthma. Ranell has improved symptoms but not fully resolved them. The asthma is considered mild and does not fully explain the persistent symptoms. Differential diagnosis includes possible cardiac etiology for chest tightness. - Continue Breo inhaler.  Consider addition of Singulair if her symptoms continue  Evaluation of chest tightness for possible cardiac etiology Persistent chest tightness occurs with exertion and at rest, sometimes during sleep. Previous heart ultrasound shows good pumping function. No prior cardiology evaluation for chest tightness. Cardiac etiology needs to be ruled out as asthma does not fully explain symptoms. - Make referral to cardiology for further evaluation  Gastroesophageal reflux disease (GERD) Continue PPI.  Symptoms appear well-controlled.  Recommendations: Continue Breo, Ventolin  Cardiology referral Continue PPI  Rhiana Morash MD [facility] 06/22/2024, 3:40 PM  CC: Desiray Orchard, MD

## 2024-06-23 ENCOUNTER — Encounter: Payer: Self-pay | Admitting: Physician Assistant

## 2024-06-23 ENCOUNTER — Ambulatory Visit: Attending: Physician Assistant | Admitting: Physician Assistant

## 2024-06-23 VITALS — BP 126/78 | HR 82 | Ht 63.0 in | Wt 208.0 lb

## 2024-06-23 DIAGNOSIS — R072 Precordial pain: Secondary | ICD-10-CM | POA: Diagnosis not present

## 2024-06-23 DIAGNOSIS — E7800 Pure hypercholesterolemia, unspecified: Secondary | ICD-10-CM

## 2024-06-23 DIAGNOSIS — I10 Essential (primary) hypertension: Secondary | ICD-10-CM

## 2024-06-23 DIAGNOSIS — R0602 Shortness of breath: Secondary | ICD-10-CM | POA: Diagnosis not present

## 2024-06-23 MED ORDER — METOPROLOL TARTRATE 100 MG PO TABS
ORAL_TABLET | ORAL | 0 refills | Status: DC
Start: 2024-06-23 — End: 2024-08-26

## 2024-06-23 NOTE — Progress Notes (Signed)
 OFFICE NOTE:    Date:  06/23/2024  ID:  Channing JONELLE Riding, DOB 21-Mar-1966, MRN [id number] PCP: Katheen Roselie Rockford, NP   HeartCare Providers Cardiologist:  None       Pre-diabetes  Hypertension  Hyperlipidemia  TTE 03/05/2024: EF 55-60, no RWMA, mild LVH, normal RVSF, trivial MR, RAP 3, no pericardial effusion GERD Aortic atherosclerosis Asthma        Discussed the use of AI scribe software for clinical note transcription with the patient, who gave verbal consent to proceed. History of Present Illness TAQUITA DEMBY is a 58 y.o. female who is referred by Mannam, Praveen, MD for chest pain and shortness of breath. She was last seen yesterday by Dr. Theophilus.  She has been managed with inhaled medications with some improvement.  Full PFTs are planned.  She is referred to cardiology for evaluation to rule out cardiac cause for her symptoms.  She experiences chest discomfort described as a squeezing sensation that occurs with exertion and sometimes at rest. The discomfort is accompanied by shortness of breath and occasionally causes near-syncope. The pain intensifies during exertional activities, necessitating rest. She also experiences nocturnal dyspnea, waking up suddenly out of breath and needing to sit up before lying back down.  Transient foot swelling that does not resolve with elevation is noted.  Family history is significant for heart issues in her grandmother and sister, with her sister having a pacemaker. Her mother passed away from cervical cancer.  She denies alcohol, drug use, and smoking. 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    ROS-See HPI    Studies Reviewed:  EKG Interpretation Date/Time:  Tuesday June 23 2024 14:09:40 EDT Ventricular Rate:  82 PR Interval:  156 QRS Duration:  84 QT Interval:  378 QTC Calculation: 441 R Axis:   29  Text Interpretation: Normal sinus rhythm  Nonspecific T wave abnormality No significant change since last tracing Confirmed by Lelon Hamilton [id number]) on 06/23/2024 2:19:20 PM    Labs-chart review 02/04/2024: Total cholesterol 188, triglycerides 78, HDL 54.1, LDL 118, creatinine 0.86, potassium 3.9, A1c 5.9 03/11/2024: Hemoglobin 12.7        Physical Exam:  VS:  BP 126/78 (BP Location: Left Arm, Patient Position: Sitting, Cuff Size: Large)   Pulse 82   Ht 5' 3 (1.6 m)   Wt 208 lb (94.3 kg)   LMP 11/10/2011   SpO2 97%   BMI 36.85 kg/m        Wt Readings from Last 3 Encounters:  06/23/24 208 lb (94.3 kg)  06/22/24 204 lb (92.5 kg)  03/11/24 204 lb 3.2 oz (92.6 kg)    Constitutional:      Appearance: Healthy appearance. Not in distress.  Neck:     Vascular: No carotid bruit or JVR. JVD normal.  Pulmonary:     Breath sounds: Normal breath sounds. No wheezing. No rales.  Cardiovascular:     Normal rate. Regular rhythm.     Murmurs: There is no murmur.  Edema:    Peripheral edema absent.  Abdominal:     Palpations: Abdomen is soft.       Assessment and Plan:    Assessment & Plan Precordial chest pain She has intermittent chest discomfort described as squeezing.  This often occurs with exertion.  She can also feel it at rest.  Her symptoms have been stable over the past few months.  She  has associated shortness of breath.  She is also had symptoms of probable paroxysmal nocturnal dyspnea.  She had LVH on recent echocardiogram but no diastolic dysfunction.  She has several risk factors including aortic atherosclerosis, hypertension, hyperlipidemia and prediabetes.  I have recommended proceeding with coronary CTA to rule out ischemic heart disease. -Arrange CCTA -Follow-up 2 to 3 months Shortness of breath She does not have findings suggestive of congestive heart failure on exam.  However, some of her symptoms are suspicious for CHF.  She did have LVH on recent echocardiogram.   -Arrange CCTA as noted.   -Also obtain  NT-proBNP to rule out congestive heart failure. Primary hypertension Blood pressure controlled.  Continue HCTZ 12.5 mg daily. Pure hypercholesterolemia Recent LDL 118.  Goal LDL should be at least less than 100.  If her CCTA demonstrates significant plaque burden or elevated calcium  score, would aim for a an LDL less than 70. - Continue Crestor  20 mg daily.         Dispo:  Return in about 3 months (around 09/23/2024) for Follow up after testing, w/ Glendia Ferrier, PA-C.  Signed, Glendia Ferrier, PA-C

## 2024-06-23 NOTE — Assessment & Plan Note (Signed)
 Recent LDL 118.  Goal LDL should be at least less than 100.  If her CCTA demonstrates significant plaque burden or elevated calcium  score, would aim for a an LDL less than 70. - Continue Crestor  20 mg daily.

## 2024-06-23 NOTE — Patient Instructions (Signed)
 Medication Instructions:  Your physician recommends that you continue on your current medications as directed. Please refer to the Current Medication list given to you today.  *If you need a refill on your cardiac medications before your next appointment, please call your pharmacy*  Lab Work: TODAY:  BMET & PRO BNP  If you have labs (blood work) drawn today and your tests are completely normal, you will receive your results only by: MyChart Message (if you have MyChart) OR A paper copy in the mail If you have any lab test that is abnormal or we need to change your treatment, we will call you to review the results.  Testing/Procedures: Your physician has requested that you have cardiac CT. Cardiac computed tomography (CT) is a painless test that uses an x-ray machine to take clear, detailed pictures of your heart. For further information please visit [email or web address] Please follow instruction sheet BELOW:    Your cardiac CT will be scheduled at one of the below locations:   [facility] [address] [place], [place] [address] [phone] (Severe contrast allergies only)  OR   [facility] [address] [place], [place] [address] [phone]  OR   [facility] [address] [place], [place] [address] [phone]  OR   [facility] [address]  [place], [place] [address] [phone]  OR   [facility] [address] [place], [place] [phone]  If scheduled at [facility], please arrive at the [facility] and Children's Entrance (Entrance C2) of [facility] 30 minutes prior to test start time. You can use the FREE valet parking offered at entrance C (encouraged to control the heart rate for the test)  Proceed to the [facility] Radiology Department (first floor) to check-in and test prep.  All radiology patients and guests should use entrance C2 at  [facility], accessed from [facility], even though the hospital's physical address listed is [address].  If scheduled at the Heart and Vascular Tower at [address], please enter the parking lot using the [address] entrance and use the FREE valet service at the patient drop-off area. Enter the building and check-in with registration on the main floor.  If scheduled at [facility], please arrive to the Heart and Vascular Center 15 mins early for check-in and test prep.  There is spacious parking and easy access to the radiology department from the [facility] Heart and Vascular entrance. Please enter here and check-in with the desk attendant.   If scheduled at [facility], please arrive 30 minutes early for check-in and test prep.  Please follow these instructions carefully (unless otherwise directed):  An IV will be required for this test and Nitroglycerin will be given.    On the Night Before the Test: Be sure to Drink plenty of water. Do not consume any caffeinated/decaffeinated beverages or chocolate 12 hours prior to your test. Do not take any antihistamines 12 hours prior to your test.   On the Day of the Test: Drink plenty of water until 1 hour prior to the test. Do not eat any food 1 hour prior to test. You may take your regular medications prior to the test.  Take metoprolol  (Lopressor ) two hours prior to test. HOLD Hydrochlorothiazide  the morning of the test. FEMALES- please wear underwire-free bra if available, avoid dresses & tight clothing  After the Test: Drink plenty of water. After receiving IV contrast, you may experience a mild flushed feeling. This is normal. On occasion, you may experience a mild rash up to 24 hours after the test. This is not dangerous. If this occurs, you can take Benadryl  25 mg, Zyrtec, Claritin, or Allegra and increase your fluid intake. (Patients taking Tikosyn should avoid  Benadryl , and may take Zyrtec, Claritin, or Allegra) If you experience trouble breathing, this can be serious. If it is severe call 911 IMMEDIATELY. If it is mild, please call our office.  We will call to schedule your test 2-4 weeks out understanding that some insurance companies will need an authorization prior to the service being performed.   For more information and frequently asked questions, please visit our website : [email or web address]  For non-scheduling related questions, please contact the cardiac imaging nurse navigator should you have any questions/concerns: Cardiac Imaging Nurse Navigators Direct Office Dial: [phone]   For scheduling needs, including cancellations and rescheduling, please call [place], [phone].   Follow-Up: At [facility], you and your health needs are our priority.  As part of our continuing mission to provide you with exceptional heart care, our providers are all part of one team.  This team includes your primary Cardiologist (physician) and Advanced Practice Providers or APPs (Physician Assistants and Nurse Practitioners) who all work together to provide you with the care you need, when you need it.  Your next appointment:   2 month(s)  Provider:   Glendia Ferrier, PA-C          We recommend signing up for the patient portal called MyChart.  Sign up information is provided on this After Visit Summary.  MyChart is used to connect with patients for Virtual Visits (Telemedicine).  Patients are able to view lab/test results, encounter notes, upcoming appointments, etc.  Non-urgent messages can be sent to your provider as well.   To learn more about what you can do with MyChart, go to [email or web address].   Other Instructions

## 2024-06-23 NOTE — Assessment & Plan Note (Signed)
 Blood pressure controlled.  Continue HCTZ 12.5 mg daily.

## 2024-06-24 ENCOUNTER — Ambulatory Visit: Payer: Self-pay | Admitting: Physician Assistant

## 2024-06-24 DIAGNOSIS — I700 Atherosclerosis of aorta: Secondary | ICD-10-CM

## 2024-06-24 DIAGNOSIS — R072 Precordial pain: Secondary | ICD-10-CM

## 2024-06-24 LAB — BASIC METABOLIC PANEL WITH GFR
BUN/Creatinine Ratio: 15 (ref 9–23)
BUN: 14 mg/dL (ref 6–24)
CO2: 27 mmol/L (ref 20–29)
Calcium: 10 mg/dL (ref 8.7–10.2)
Chloride: 103 mmol/L (ref 96–106)
Creatinine, Ser: 0.92 mg/dL (ref 0.57–1.00)
Glucose: 89 mg/dL (ref 70–99)
Potassium: 3.9 mmol/L (ref 3.5–5.2)
Sodium: 146 mmol/L — ABNORMAL HIGH (ref 134–144)
eGFR: 74 mL/min/1.73 (ref >59)

## 2024-06-24 LAB — PRO B NATRIURETIC PEPTIDE: NT-Pro BNP: <36 pg/mL (ref 0–287)

## 2024-07-01 ENCOUNTER — Encounter (HOSPITAL_COMMUNITY): Payer: Self-pay

## 2024-07-01 ENCOUNTER — Telehealth (HOSPITAL_COMMUNITY): Payer: Self-pay | Admitting: *Deleted

## 2024-07-01 NOTE — Telephone Encounter (Signed)
 Attempted to call patient regarding upcoming cardiac CT appointment. Left message on voicemail with name and callback number  Larey Brick RN Navigator Cardiac Imaging [facility] [phone] Office [phone] Cell

## 2024-07-01 NOTE — Telephone Encounter (Signed)
Patient returning call about her upcoming cardiac imaging study; pt verbalizes understanding of appt date/time, parking situation and where to check in, pre-test NPO status and medications ordered, and verified current allergies; name and call back number provided for further questions should they arise  Daylyn Azbill RN Navigator Cardiac Imaging [place] [facility] [phone] office [phone] cell  Patient to take 100mg metoprolol tartrate two hours prior to her cardiac CT scan. 

## 2024-07-03 ENCOUNTER — Ambulatory Visit (HOSPITAL_COMMUNITY)
Admission: RE | Admit: 2024-07-03 | Discharge: 2024-07-03 | Disposition: A | Discharge status: Home / Self Care | Source: Ambulatory Visit | Attending: Physician Assistant | Admitting: Physician Assistant

## 2024-07-03 ENCOUNTER — Encounter: Payer: Self-pay | Admitting: Physician Assistant

## 2024-07-03 DIAGNOSIS — I10 Essential (primary) hypertension: Secondary | ICD-10-CM | POA: Insufficient documentation

## 2024-07-03 DIAGNOSIS — I700 Atherosclerosis of aorta: Secondary | ICD-10-CM | POA: Insufficient documentation

## 2024-07-03 DIAGNOSIS — R0602 Shortness of breath: Secondary | ICD-10-CM | POA: Insufficient documentation

## 2024-07-03 DIAGNOSIS — R072 Precordial pain: Secondary | ICD-10-CM | POA: Insufficient documentation

## 2024-07-03 MED ORDER — IOHEXOL 350 MG/ML SOLN
100.0000 mL | Freq: Once | INTRAVENOUS | Status: AC | PRN
  Administered 2024-07-03: 100 mL via INTRAVENOUS

## 2024-07-03 MED ORDER — NITROGLYCERIN 0.4 MG SL SUBL
0.8000 mg | SUBLINGUAL_TABLET | Freq: Once | SUBLINGUAL | Status: AC
  Administered 2024-07-03: 0.8 mg via SUBLINGUAL

## 2024-07-13 ENCOUNTER — Other Ambulatory Visit: Payer: Self-pay | Admitting: Nurse Practitioner

## 2024-07-13 DIAGNOSIS — K219 Gastro-esophageal reflux disease without esophagitis: Secondary | ICD-10-CM

## 2024-07-16 ENCOUNTER — Other Ambulatory Visit: Payer: Self-pay | Admitting: Nurse Practitioner

## 2024-07-16 DIAGNOSIS — E785 Hyperlipidemia, unspecified: Secondary | ICD-10-CM

## 2024-07-27 ENCOUNTER — Telehealth: Payer: Self-pay | Admitting: Physician Assistant

## 2024-07-27 NOTE — Telephone Encounter (Signed)
 Noted! Thank you

## 2024-07-27 NOTE — Telephone Encounter (Signed)
 FYI  Madeline Hawkins is calling to let us  know if we need any help with her heart care she is available to help.

## 2024-08-13 ENCOUNTER — Other Ambulatory Visit: Payer: Self-pay | Admitting: Nurse Practitioner

## 2024-08-13 DIAGNOSIS — K219 Gastro-esophageal reflux disease without esophagitis: Secondary | ICD-10-CM

## 2024-08-13 NOTE — Telephone Encounter (Signed)
Patient needs office appointment.

## 2024-08-17 ENCOUNTER — Other Ambulatory Visit: Payer: Self-pay | Admitting: Nurse Practitioner

## 2024-08-17 DIAGNOSIS — E785 Hyperlipidemia, unspecified: Secondary | ICD-10-CM

## 2024-08-17 DIAGNOSIS — I10 Essential (primary) hypertension: Secondary | ICD-10-CM

## 2024-08-17 NOTE — Telephone Encounter (Signed)
 Patient overdue for office follow up appointment. I called patient and she is now scheduled for an appointment for a CPE on 08/26/24 at 8:20 AM

## 2024-08-25 ENCOUNTER — Other Ambulatory Visit: Payer: Self-pay | Admitting: Medical Genetics

## 2024-08-25 ENCOUNTER — Encounter: Payer: Self-pay | Admitting: *Deleted

## 2024-08-26 ENCOUNTER — Ambulatory Visit: Payer: Self-pay | Admitting: Nurse Practitioner

## 2024-08-26 ENCOUNTER — Ambulatory Visit (INDEPENDENT_AMBULATORY_CARE_PROVIDER_SITE_OTHER): Admitting: Nurse Practitioner

## 2024-08-26 ENCOUNTER — Encounter: Payer: Self-pay | Admitting: Nurse Practitioner

## 2024-08-26 ENCOUNTER — Ambulatory Visit: Admitting: Physician Assistant

## 2024-08-26 VITALS — BP 128/80 | HR 88 | Temp 97.4°F | Ht 63.0 in | Wt 215.6 lb

## 2024-08-26 DIAGNOSIS — Z23 Encounter for immunization: Secondary | ICD-10-CM

## 2024-08-26 DIAGNOSIS — Z0001 Encounter for general adult medical examination with abnormal findings: Secondary | ICD-10-CM

## 2024-08-26 DIAGNOSIS — I10 Essential (primary) hypertension: Secondary | ICD-10-CM | POA: Diagnosis not present

## 2024-08-26 DIAGNOSIS — G8929 Other chronic pain: Secondary | ICD-10-CM | POA: Insufficient documentation

## 2024-08-26 DIAGNOSIS — R7303 Prediabetes: Secondary | ICD-10-CM | POA: Diagnosis not present

## 2024-08-26 DIAGNOSIS — Z006 Encounter for examination for normal comparison and control in clinical research program: Secondary | ICD-10-CM

## 2024-08-26 DIAGNOSIS — M25562 Pain in left knee: Secondary | ICD-10-CM

## 2024-08-26 DIAGNOSIS — N951 Menopausal and female climacteric states: Secondary | ICD-10-CM

## 2024-08-26 DIAGNOSIS — E782 Mixed hyperlipidemia: Secondary | ICD-10-CM

## 2024-08-26 LAB — COMPREHENSIVE METABOLIC PANEL WITH GFR
ALT: 21 U/L (ref 0–35)
AST: 20 U/L (ref 0–37)
Albumin: 4.4 g/dL (ref 3.5–5.2)
Alkaline Phosphatase: 60 U/L (ref 39–117)
BUN: 12 mg/dL (ref 6–23)
CO2: 32 meq/L (ref 19–32)
Calcium: 9.6 mg/dL (ref 8.4–10.5)
Chloride: 103 meq/L (ref 96–112)
Creatinine, Ser: 0.77 mg/dL (ref 0.40–1.20)
GFR: 86.4 mL/min (ref >60.00)
Glucose, Bld: 78 mg/dL (ref 70–99)
Potassium: 3.7 meq/L (ref 3.5–5.1)
Sodium: 141 meq/L (ref 135–145)
Total Bilirubin: 0.5 mg/dL (ref 0.2–1.2)
Total Protein: 7.3 g/dL (ref 6.0–8.3)

## 2024-08-26 LAB — LIPID PANEL
Cholesterol: 197 mg/dL (ref 0–200)
HDL: 55.5 mg/dL (ref >39.00)
LDL Cholesterol: 125 mg/dL — ABNORMAL HIGH (ref 0–99)
NonHDL: 141.58
Total CHOL/HDL Ratio: 4
Triglycerides: 83 mg/dL (ref 0.0–149.0)
VLDL: 16.6 mg/dL (ref 0.0–40.0)

## 2024-08-26 LAB — HEMOGLOBIN A1C: Hgb A1c MFr Bld: 6.3 % (ref 4.6–6.5)

## 2024-08-26 MED ORDER — HYDROCHLOROTHIAZIDE 12.5 MG PO TABS: 12.5000 mg | ORAL_TABLET | Freq: Every day | ORAL | 1 refills | Status: AC

## 2024-08-26 MED ORDER — DICLOFENAC SODIUM 1 % EX GEL: 2.0000 g | Freq: Three times a day (TID) | CUTANEOUS | Status: AC | PRN

## 2024-08-26 MED ORDER — VENLAFAXINE HCL ER 37.5 MG PO CP24: 37.5000 mg | ORAL_CAPSULE | Freq: Every day | ORAL | 3 refills | Status: AC

## 2024-08-26 MED ORDER — POTASSIUM CHLORIDE CRYS ER 20 MEQ PO TBCR: 20.0000 meq | EXTENDED_RELEASE_TABLET | Freq: Every day | ORAL | 1 refills | Status: AC

## 2024-08-26 NOTE — Assessment & Plan Note (Signed)
 Bp at goal with HCTZ BP Readings from Last 3 Encounters:  08/26/24 128/80  07/03/24 107/72  06/23/24 126/78   Repeat CMP today Maintain med dose

## 2024-08-26 NOTE — Patient Instructions (Signed)
 Go to lab Maintain Heart healthy diet and daily exercise. Maintain current medications. Ok to use Voltaren gel or salon pas patch on left knee. Start knee exercise  Exercises for Chronic Knee Pain Chronic knee pain is pain that lasts longer than 3 months. For most people with chronic knee pain, exercise and weight loss is an important part of treatment. Your health care provider may want you to focus on: Making the muscles that support your knee stronger. This can take pressure off your knee and reduce pain. Preventing knee stiffness. How far you can move your knee, keeping it there or making it farther. Losing weight (if this applies) to take pressure off your knee, lower your risk for injury, and make it easier for you to exercise. Your provider will help you make an exercise program that fits your needs and physical abilities. Below are simple, low-impact exercises you can do at home. Ask your provider or physical therapist how often you should do your exercise program and how many times to repeat each exercise. General safety tips  Get your provider's approval before doing any exercises. Start slowly and stop any time you feel pain. Do not exercise if your knee pain is flaring up. Warm up first. Stretching a cold muscle can cause an injury. Do 5-10 minutes of easy movement or light stretching before beginning your exercises. Do 5-10 minutes of low-impact activity (like walking or cycling) before starting strengthening exercises. Contact your provider any time you have pain during or after exercising. Exercise can cause discomfort but should not be painful. It is normal to be a little stiff or sore after exercising. Stretching and range-of-motion exercises Front thigh stretch  Stand up straight and support your body by holding on to a chair or resting one hand on a wall. With your legs straight and close together, bend one knee to lift your heel up toward your butt. Using one hand for  support, grab your ankle with your free hand. Pull your foot up closer toward your butt to feel the stretch in front of your thigh. Hold the stretch for 30 seconds. Repeat __________ times. Complete this exercise __________ times a day. Back thigh stretch  Sit on the floor with your back straight and your legs out straight in front of you. Place the palms of your hands on the floor and slide them toward your feet as you bend at the hip. Try to touch your nose to your knees and feel the stretch in the back of your thighs. Hold for 30 seconds. Repeat __________ times. Complete this exercise __________ times a day. Calf stretch  Stand facing a wall. Place the palms of your hands flat against the wall, arms extended, and lean slightly against the wall. Get into a lunge position with one leg bent at the knee and the other leg stretched out straight behind you. Keep both feet facing the wall and increase the bend in your knee while keeping the heel of the other leg flat on the ground. You should feel the stretch in your calf. Hold for 30 seconds. Repeat __________ times. Complete this exercise __________ times a day. Strengthening exercises Straight leg lift  Lie on your back with one knee bent and the other leg out straight. Slowly lift the straight leg without bending the knee. Lift until your foot is about 12 inches (30 cm) off the floor. Hold for 3-5 seconds and slowly lower your leg. Repeat __________ times. Complete this exercise __________ times a  day. Single leg dip  Stand between two chairs and put both hands on the backs of the chairs for support. Extend one leg out straight with your body weight resting on the heel of the standing leg. Slowly bend your standing knee to dip your body to the level that is comfortable for you. Hold for 3-5 seconds. Repeat __________ times. Complete this exercise __________ times a day. Hamstring curls  Stand straight, knees close together,  facing the back of a chair. Hold on to the back of a chair with both hands. Keep one leg straight. Bend the other knee while bringing the heel up toward the butt until the knee is bent at a 90-degree angle (right angle). Hold for 3-5 seconds. Repeat __________ times. Complete this exercise __________ times a day. Wall squat  Stand straight with your back, hips, and head against a wall. Step forward one foot at a time with your back still against the wall. Your feet should be 2 feet (61 cm) from the wall at shoulder width. Keeping your back, hips, and head against the wall, slide down the wall to as close to a sitting position as you can get. Hold for 5-10 seconds, then slowly slide back up. Repeat __________ times. Complete this exercise __________ times a day. Step-ups  Stand in front of a sturdy platform or stool that is about 6 inches (15 cm) high. Slowly step up with your left / right foot, keeping your knee in line with your hip and foot. Do not let your knee bend so far that you cannot see your toes. Hold on to a chair for balance, but do not use it for support. Slowly unlock your knee and lower yourself to the starting position. Repeat __________ times. Complete this exercise __________ times a day. Contact a health care provider if: Your exercises cause pain. Your pain is worse after you exercise. Your pain prevents you from doing your exercises. This information is not intended to replace advice given to you by your health care provider. Make sure you discuss any questions you have with your health care provider. Document Revised: 10/30/2022 Document Reviewed: 10/30/2022 Elsevier Patient Education  2024 [employer].

## 2024-08-26 NOTE — Assessment & Plan Note (Signed)
 Stable with effexor  Advised to incorporate daily exercise

## 2024-08-26 NOTE — Assessment & Plan Note (Signed)
 Advised to use voltaren gel 3x/day prn and start daily knee exercises Provided printed information

## 2024-08-26 NOTE — Progress Notes (Signed)
 Complete physical exam  Patient: Madeline Hawkins   DOB: [birth year]   58 y.o. Female  MRN: [id number] Visit Date: 08/26/2024  Subjective:    Chief Complaint  Patient presents with   Annual Exam    FASTING    Madeline Hawkins is a 58 y.o. female who presents today for a complete physical exam. She reports consuming a general diet. Walking daily at work She generally feels well. She reports sleeping well. She does not have additional problems to discuss today.  Vision:Yes Dental:Yes STD Screen:No  BP Readings from Last 3 Encounters:  08/26/24 128/80  07/03/24 107/72  06/23/24 126/78   Wt Readings from Last 3 Encounters:  08/26/24 215 lb 9.6 oz (97.8 kg)  06/23/24 208 lb (94.3 kg)  06/22/24 204 lb (92.5 kg)   Most recent fall risk assessment:    08/26/2024    8:23 AM  Fall Risk   Falls in the past year? 0  Injury with Fall? 0  Risk for fall due to : No Fall Risks  Follow up Falls evaluation completed     Depression screen:Yes - No Depression Most recent depression screenings:    08/26/2024    8:23 AM 09/30/2023   10:57 AM  PHQ 2/9 Scores  PHQ - 2 Score 2 0  PHQ- 9 Score 4    Knee Pain  The incident occurred more than 1 week ago (several months). There was no injury mechanism. The pain is present in the left knee. The pain is moderate. The pain has been Intermittent since onset. Pertinent negatives include no inability to bear weight, loss of motion, loss of sensation, muscle weakness, numbness or tingling. She reports no foreign bodies present. The symptoms are aggravated by weight bearing. She has tried rest for the symptoms. The treatment provided mild relief.    Essential hypertension Bp at goal with HCTZ BP Readings from Last 3 Encounters:  08/26/24 128/80  07/03/24 107/72  06/23/24 126/78   Repeat CMP today Maintain med dose  Hyperlipidemia No adverse effects with crestor  Repeat lipid panel  Chronic pain of left knee Advised to use voltaren gel 3x/day  prn and start daily knee exercises Provided printed information  Vasomotor symptoms due to menopause Stable with effexor  Advised to incorporate daily exercise  Past Medical History:  Diagnosis Date   Adjustment disorder with depressed mood 08/05/2015   Anxiety    Aortic atherosclerosis 07/03/2024   CCTA 07/03/2024: Aortic valve trileaflet without calcification, aorta normal caliber with aortic atherosclerosis; CAC score 0, no CAD.    Bartholin gland cyst    CIN I (cervical intraepithelial neoplasia I) 1989   cryo...also in 2008 .. had cryo    GERD (gastroesophageal reflux disease)    Hyperlipidemia    Hypertension    Intertrigo 04/13/2021   Lower abdominal pain 12/19/2022   Past Surgical History:  Procedure Laterality Date   ABDOMINAL HYSTERECTOMY  11/22/2011   Procedure: HYSTERECTOMY ABDOMINAL;  Surgeon: Curlee VEAR Guan, MD;  Location: [facility];  Service: Gynecology;  Laterality: N/A;   BARTHOLIN CYST MARSUPIALIZATION Left 12/02/2018   Procedure: BARTHOLIN MARSUPIALIZATION OF LEFT GLAND;  Surgeon: Lavoie, Marie-Lyne, MD;  Location: [facility];  Service: Gynecology;  Laterality: Left;   colon polyps removed     GYNECOLOGIC CRYOSURGERY  07/07/2007   LAPAROSCOPIC LYSIS OF ADHESIONS  06/28/2015   Procedure: LAPAROSCOPIC LYSIS OF ABDOMINAL PELVIC ADHESIONS;  Surgeon: Curlee VEAR Guan, MD;  Location: [facility];  Service: Gynecology;;   LAPAROSCOPIC  UNILATERAL SALPINGO OOPHERECTOMY Right 06/28/2015   Procedure: LAPAROSCOPIC RIGHT SALPINGO OOPHORECTOMY;  Surgeon: Curlee VEAR Guan, MD;  Location: [facility];  Service: Gynecology;  Laterality: Right;   LAPAROSCOPIC UNILATERAL SALPINGO OOPHERECTOMY Left 10/11/2016   Procedure: LAPAROSCOPIC UNILATERAL OOPHORECTOMY with pelvic washings;  Surgeon: Curlee VEAR Guan, MD;  Location: [facility];  Service: Gynecology;  Laterality: Left;  HARMONIC SCAPEL   LAPAROSCOPY  11/22/2011   Procedure: LAPAROSCOPY DIAGNOSTIC;  Surgeon: Curlee VEAR Guan, MD;   Location: [facility];  Service: Gynecology;  Laterality: N/A;   OVARIAN CYST REMOVAL  11/22/2011   Procedure: OVARIAN CYSTECTOMY;  Surgeon: Curlee VEAR Guan, MD;  Location: [facility];  Service: Gynecology;  Laterality: Left;   SALPINGECTOMY     left for ectopic pregnancy   TUBAL LIGATION     right tubal ligation   Social History   Socioeconomic History   Marital status: Married    Spouse name: Not on file   Number of children: 4   Years of education: Not on file   Highest education level: Not on file  Occupational History   Occupation: Lead Custodian    Employer: [employer]  Tobacco Use   Smoking status: Never    Passive exposure: Past   Smokeless tobacco: Never  Vaping Use   Vaping status: Never Used  Substance and Sexual Activity   Alcohol use: No    Alcohol/week: 0.0 standard drinks of alcohol   Drug use: No   Sexual activity: Yes    Partners: Male    Birth control/protection: Surgical    Comment: 1st intercourse- 19, partners- 4, married- 12 yrs   Other Topics Concern   Not on file  Social History Narrative   Not on file   Social Drivers of Health   Financial Resource Strain: Not on file  Food Insecurity: Not on file  Transportation Needs: Not on file  Physical Activity: Not on file  Stress: Not on file  Social Connections: Not on file  Intimate Partner Violence: Not on file   Family Status  Relation Name Status   Mother  Deceased   Father  Alive   Sister  Alive   Brother  Alive   Mat Aunt  Deceased   Pat Aunt  Deceased   MGM  (Not Specified)  No partnership data on file   Family History  Problem Relation Age of Onset   Ovarian cancer Mother    Cancer Father        colon cancer   Bradycardia Sister        pacemaker   Diabetes Brother    Breast cancer Maternal Aunt        Age 33's   Ovarian cancer Paternal Aunt    Heart disease Maternal Grandmother    Breast cancer Maternal Grandmother        Age 57's   No Known Allergies  Patient Care  Team: Rilen Shukla, Roselie Rockford, NP as PCP - General (Internal Medicine) Guan Curlee VEAR, MD (Inactive) as Consulting Physician (Gynecology) Brinda Elsie BRAVO, OD as Consulting Physician (Optometry) Kristie Lamprey, MD as Consulting Physician (Gastroenterology)   Medications: Outpatient Medications Prior to Visit  Medication Sig   Multiple Vitamins-Minerals (WOMENS MULTIVITAMIN PLUS) TABS Take by mouth daily.   pantoprazole  (PROTONIX ) 20 MG tablet TAKE 1 TABLET BY MOUTH EVERY DAY   rosuvastatin  (CRESTOR ) 20 MG tablet TAKE 1 TABLET BY MOUTH EVERY DAY   [DISCONTINUED] hydrochlorothiazide  (HYDRODIURIL ) 12.5 MG tablet TAKE 1 TABLET BY MOUTH EVERY DAY   [  DISCONTINUED] potassium chloride  SA (KLOR-CON  M20) 20 MEQ tablet Take 1 tablet (20 mEq total) by mouth daily.   [DISCONTINUED] venlafaxine  XR (EFFEXOR  XR) 37.5 MG 24 hr capsule Take 1 capsule (37.5 mg total) by mouth daily with breakfast.   [DISCONTINUED] albuterol  (VENTOLIN  HFA) 108 (90 Base) MCG/ACT inhaler Inhale 2 puffs into the lungs every 6 (six) hours as needed for wheezing or shortness of breath. (Patient not taking: Reported on 08/26/2024)   [DISCONTINUED] fluticasone  (FLONASE ) 50 MCG/ACT nasal spray Place 2 sprays into both nostrils daily. (Patient not taking: Reported on 08/26/2024)   [DISCONTINUED] fluticasone  furoate-vilanterol (BREO ELLIPTA ) 100-25 MCG/ACT AEPB Inhale 1 puff into the lungs daily. (Patient not taking: Reported on 08/26/2024)   [DISCONTINUED] methocarbamol  (ROBAXIN ) 500 MG tablet Take 1 tablet (500 mg total) by mouth every 8 (eight) hours as needed for muscle spasms. (Patient not taking: Reported on 08/26/2024)   [DISCONTINUED] metoprolol  tartrate (LOPRESSOR ) 100 MG tablet TAKE 1 TABLET BY MOUTH 2 HOURS PRIOR TO THE CARDIAC CT (Patient not taking: Reported on 08/26/2024)   No facility-administered medications prior to visit.    Review of Systems  Constitutional:  Negative for activity change, appetite change and unexpected  weight change.  Respiratory: Negative.    Cardiovascular: Negative.   Gastrointestinal: Negative.   Endocrine: Negative for cold intolerance and heat intolerance.  Genitourinary: Negative.   Musculoskeletal: Negative.   Skin: Negative.   Neurological: Negative.  Negative for tingling and numbness.  Hematological: Negative.   Psychiatric/Behavioral:  Negative for behavioral problems, decreased concentration, dysphoric mood, hallucinations, self-injury, sleep disturbance and suicidal ideas. The patient is not nervous/anxious.         Objective:  BP 128/80 (BP Location: Left Arm, Patient Position: Sitting, Cuff Size: Large)   Pulse 88   Temp (!) 97.4 F (36.3 C) (Temporal)   Ht 5' 3 (1.6 m)   Wt 215 lb 9.6 oz (97.8 kg)   LMP 11/10/2011   SpO2 99%   BMI 38.19 kg/m     Physical Exam Vitals and nursing note reviewed.  Constitutional:      General: She is not in acute distress. HENT:     Right Ear: Tympanic membrane, ear canal and external ear normal.     Left Ear: Tympanic membrane, ear canal and external ear normal.     Nose: Nose normal.  Eyes:     Extraocular Movements: Extraocular movements intact.     Conjunctiva/sclera: Conjunctivae normal.     Pupils: Pupils are equal, round, and reactive to light.  Neck:     Thyroid : No thyroid  mass, thyromegaly or thyroid  tenderness.  Cardiovascular:     Rate and Rhythm: Normal rate and regular rhythm.     Pulses: Normal pulses.     Heart sounds: Normal heart sounds.  Pulmonary:     Effort: Pulmonary effort is normal.     Breath sounds: Normal breath sounds.  Abdominal:     General: Bowel sounds are normal.     Palpations: Abdomen is soft.  Musculoskeletal:     Cervical back: Normal range of motion and neck supple.     Right hip: Normal.     Left hip: Normal.     Right upper leg: Normal.     Left upper leg: Normal.     Right knee: Normal.     Left knee: Crepitus present. No swelling, effusion or erythema. Decreased range  of motion. Tenderness present over the medial joint line and lateral joint line. No patellar tendon  tenderness.     Right lower leg: No edema.     Left lower leg: No edema.  Lymphadenopathy:     Cervical: No cervical adenopathy.  Skin:    General: Skin is warm and dry.  Neurological:     Mental Status: She is alert and oriented to person, place, and time.     Cranial Nerves: No cranial nerve deficit.  Psychiatric:        Mood and Affect: Mood normal.        Behavior: Behavior normal.        Thought Content: Thought content normal.        Judgment: Judgment normal.     Results for orders placed or performed in visit on 08/26/24  Lipid panel  Result Value Ref Range   Cholesterol 197 0 - 200 mg/dL   Triglycerides 16.9 0.0 - 149.0 mg/dL   HDL 44.49 >60.99 mg/dL   VLDL 83.3 0.0 - 59.9 mg/dL   LDL Cholesterol 874 (H) 0 - 99 mg/dL   Total CHOL/HDL Ratio 4    NonHDL 141.58   Comprehensive metabolic panel with GFR  Result Value Ref Range   Sodium 141 135 - 145 mEq/L   Potassium 3.7 3.5 - 5.1 mEq/L   Chloride 103 96 - 112 mEq/L   CO2 32 19 - 32 mEq/L   Glucose, Bld 78 70 - 99 mg/dL   BUN 12 6 - 23 mg/dL   Creatinine, Ser 9.22 0.40 - 1.20 mg/dL   Total Bilirubin 0.5 0.2 - 1.2 mg/dL   Alkaline Phosphatase 60 39 - 117 U/L   AST 20 0 - 37 U/L   ALT 21 0 - 35 U/L   Total Protein 7.3 6.0 - 8.3 g/dL   Albumin 4.4 3.5 - 5.2 g/dL   GFR 13.59 >39.99 mL/min   Calcium  9.6 8.4 - 10.5 mg/dL  Hemoglobin J8r  Result Value Ref Range   Hgb A1c MFr Bld 6.3 4.6 - 6.5 %  GeneConnect Molecular Screen - Blood  Result Value Ref Range   [facility] Molecular Screen-Blood Received       Assessment & Plan:    Routine Health Maintenance and Physical Exam  Immunization History  Administered Date(s) Administered   Influenza,inj,Quad PF,6+ Mos 11/14/2012, 07/22/2017, 07/29/2018, 08/26/2019   Moderna Sars-Covid-2 Vaccination 06/04/2020, 07/02/2020   Td 08/26/2024   Tdap 03/15/2014   Health Maintenance   Topic Date Due   Colonoscopy  04/05/2024   Mammogram  10/06/2024   COVID-19 Vaccine (3 - 2025-26 season) 09/11/2024 (Originally 06/29/2024)   Zoster Vaccines- Shingrix (1 of 2) 11/26/2024 (Originally 07/01/2018)   Influenza Vaccine  01/26/2025 (Originally 05/29/2024)   Pneumococcal Vaccine: 50+ Years (1 of 2 - PCV) 08/26/2025 (Originally 07/02/1987)   Hepatitis B Vaccines 19-59 Average Risk (1 of 3 - 19+ 3-dose series) 08/26/2025 (Originally 07/02/1987)   Hepatitis C Screening  08/26/2025 (Originally 07/01/1986)   DTaP/Tdap/Td (3 - Td or Tdap) 08/26/2034   HIV Screening  Completed   HPV VACCINES  Aged Out   Meningococcal B Vaccine  Aged Out   Discussed health benefits of physical activity, and encouraged her to engage in regular exercise appropriate for her age and condition. Advised to schedule mammogram and colonoscopy appts  Problem List Items Addressed This Visit     Chronic pain of left knee   Advised to use voltaren gel 3x/day prn and start daily knee exercises Provided printed information      Relevant Medications   diclofenac Sodium (  VOLTAREN) 1 % GEL   venlafaxine  XR (EFFEXOR  XR) 37.5 MG 24 hr capsule   Essential hypertension   Bp at goal with HCTZ BP Readings from Last 3 Encounters:  08/26/24 128/80  07/03/24 107/72  06/23/24 126/78   Repeat CMP today Maintain med dose      Relevant Medications   hydrochlorothiazide  (HYDRODIURIL ) 12.5 MG tablet   potassium chloride  SA (KLOR-CON  M20) 20 MEQ tablet   Hyperlipidemia   No adverse effects with crestor  Repeat lipid panel      Relevant Medications   hydrochlorothiazide  (HYDRODIURIL ) 12.5 MG tablet   Other Relevant Orders   Lipid panel (Completed)   Vasomotor symptoms due to menopause   Stable with effexor  Advised to incorporate daily exercise      Relevant Medications   venlafaxine  XR (EFFEXOR  XR) 37.5 MG 24 hr capsule   Other Visit Diagnoses       Encounter for preventative adult health care exam with abnormal  findings    -  Primary   Relevant Orders   Comprehensive metabolic panel with GFR (Completed)     Research study patient       Relevant Orders   GeneConnect Molecular Screen - Blood     Immunization due       Relevant Orders   Td : Tetanus/diphtheria >7yo Preservative  free (Completed)     Prediabetes       Relevant Orders   Hemoglobin A1c (Completed)      Return in about 6 months (around 02/24/2025) for HTN, prediabetes, hyperlipidemia (fasting).     Roselie Mood, NP

## 2024-08-26 NOTE — Assessment & Plan Note (Signed)
No adverse effects with crestor ?Repeat lipid panel ?

## 2024-09-04 LAB — GENECONNECT MOLECULAR SCREEN: Genetic Analysis Overall Interpretation: NEGATIVE

## 2024-09-20 ENCOUNTER — Other Ambulatory Visit: Payer: Self-pay | Admitting: Nurse Practitioner

## 2024-09-20 DIAGNOSIS — K219 Gastro-esophageal reflux disease without esophagitis: Secondary | ICD-10-CM

## 2024-09-20 DIAGNOSIS — E785 Hyperlipidemia, unspecified: Secondary | ICD-10-CM

## 2024-10-25 NOTE — Progress Notes (Deleted)
"    ° ° °  OFFICE NOTE:    Date:  10/25/2024  ID:  Madeline Hawkins, DOB July 20, 1966, MRN [id number] PCP: Katheen Roselie Rockford, NP  [facility] Providers Cardiologist:  None { Click to update primary MD,subspecialty MD or APP then REFRESH:1}        Chest pain  TTE 03/05/2024: EF 55-60, no RWMA, mild LVH, normal RVSF, trivial MR, RAP 3, no pericardial effusion CCTA 07/03/2024: Aortic valve trileaflet without calcification, aorta normal caliber with aortic atherosclerosis; CAC score 0, no CAD.  Pre-diabetes  Hypertension  Hyperlipidemia  GERD Aortic atherosclerosis Asthma         Discussed the use of AI scribe software for clinical note transcription with the patient, who gave verbal consent to proceed. History of Present Illness Madeline Hawkins is a 58 y.o. female  She was evaluated in 05/2024 for chest pain, shortness of breath. Previous echocardiogram in 02/2024 showed normal EF, mild LVH. I obtained a BNP which was normal. CCTA was obtained and demonstrated a CAC score of 0 and no CAD. Extracardiac findings were normal.     ROS-See HPI***    Studies Reviewed:       LABS 06/23/24: NP Pro BNP < 36 08/26/24: K 3.7, SCr 0.77, eGFR 86.40, TC 197, Trig 83, HDL 55.50, LDL 125 Results  Risk Assessment/Calculations: {Does this patient have ATRIAL FIBRILLATION?:[id number]} No BP recorded.  {Refresh Note OR Click here to enter BP  :1}***      Physical Exam:  VS:  LMP 11/10/2011        Wt Readings from Last 3 Encounters:  08/26/24 215 lb 9.6 oz (97.8 kg)  06/23/24 208 lb (94.3 kg)  06/22/24 204 lb (92.5 kg)    Physical Exam***     Assessment and Plan:    Assessment & Plan Precordial chest pain Recent CCTA w CAC score 0 and no CAD. *** Aortic atherosclerosis  Assessment and Plan Assessment & Plan    {      :1}    {Are you ordering a CV Procedure (e.g. stress test, cath, DCCV, TEE, etc)?   Press F2        :[id number]}  Dispo:  No follow-ups on file.  Signed, Glendia Ferrier, PA-C   "

## 2024-10-25 NOTE — Assessment & Plan Note (Deleted)
 Recent CCTA w CAC score 0 and no CAD. ***

## 2024-10-26 ENCOUNTER — Ambulatory Visit: Attending: Physician Assistant | Admitting: Physician Assistant

## 2024-10-26 DIAGNOSIS — R072 Precordial pain: Secondary | ICD-10-CM

## 2024-10-26 DIAGNOSIS — I700 Atherosclerosis of aorta: Secondary | ICD-10-CM

## 2025-02-24 ENCOUNTER — Ambulatory Visit: Admitting: Nurse Practitioner
